# Patient Record
Sex: Female | Born: 1955
Health system: Southern US, Community
[De-identification: ages and names within clinical notes are randomized; demographics above are authoritative.]

## PROBLEM LIST (undated history)

## (undated) DIAGNOSIS — N200 Calculus of kidney: Secondary | ICD-10-CM

## (undated) DIAGNOSIS — E785 Hyperlipidemia, unspecified: Secondary | ICD-10-CM

## (undated) DIAGNOSIS — K589 Irritable bowel syndrome without diarrhea: Secondary | ICD-10-CM

## (undated) DIAGNOSIS — H269 Unspecified cataract: Secondary | ICD-10-CM

## (undated) DIAGNOSIS — IMO0001 Reserved for inherently not codable concepts without codable children: Secondary | ICD-10-CM

## (undated) DIAGNOSIS — M1990 Unspecified osteoarthritis, unspecified site: Secondary | ICD-10-CM

## (undated) DIAGNOSIS — M810 Age-related osteoporosis without current pathological fracture: Secondary | ICD-10-CM

## (undated) DIAGNOSIS — K219 Gastro-esophageal reflux disease without esophagitis: Secondary | ICD-10-CM

## (undated) DIAGNOSIS — I10 Essential (primary) hypertension: Secondary | ICD-10-CM

## (undated) DIAGNOSIS — H409 Unspecified glaucoma: Secondary | ICD-10-CM

## (undated) DIAGNOSIS — E663 Overweight: Secondary | ICD-10-CM

## (undated) DIAGNOSIS — M8580 Other specified disorders of bone density and structure, unspecified site: Secondary | ICD-10-CM

## (undated) HISTORY — DX: Reserved for inherently not codable concepts without codable children: IMO0001

## (undated) HISTORY — DX: Age-related osteoporosis without current pathological fracture: M81.0

## (undated) HISTORY — DX: Unspecified glaucoma: H40.9

## (undated) HISTORY — DX: Unspecified cataract: H26.9

## (undated) HISTORY — DX: Gastro-esophageal reflux disease without esophagitis: K21.9

## (undated) HISTORY — DX: Unspecified osteoarthritis, unspecified site: M19.90

## (undated) HISTORY — DX: Overweight: E66.3

## (undated) HISTORY — DX: Hyperlipidemia, unspecified: E78.5

---

## 2014-04-02 LAB — HM PAP SMEAR

## 2014-05-14 ENCOUNTER — Ambulatory Visit: Payer: Self-pay | Admitting: Family Medicine

## 2014-05-14 LAB — HM MAMMOGRAPHY

## 2014-12-04 ENCOUNTER — Encounter: Payer: Self-pay | Admitting: *Deleted

## 2014-12-05 ENCOUNTER — Encounter: Payer: Self-pay | Admitting: *Deleted

## 2014-12-05 ENCOUNTER — Ambulatory Visit
Admission: RE | Admit: 2014-12-05 | Discharge: 2014-12-05 | Disposition: A | Discharge status: Home / Self Care | Payer: Commercial Managed Care - HMO | Source: Ambulatory Visit | Attending: Gastroenterology | Admitting: Gastroenterology

## 2014-12-05 ENCOUNTER — Ambulatory Visit: Payer: Commercial Managed Care - HMO | Admitting: Anesthesiology

## 2014-12-05 ENCOUNTER — Encounter
Admission: RE | Disposition: A | Discharge status: Home / Self Care | Payer: Self-pay | Source: Ambulatory Visit | Attending: Gastroenterology

## 2014-12-05 DIAGNOSIS — M8580 Other specified disorders of bone density and structure, unspecified site: Secondary | ICD-10-CM | POA: Insufficient documentation

## 2014-12-05 DIAGNOSIS — D122 Benign neoplasm of ascending colon: Secondary | ICD-10-CM | POA: Diagnosis not present

## 2014-12-05 DIAGNOSIS — K640 First degree hemorrhoids: Secondary | ICD-10-CM | POA: Diagnosis not present

## 2014-12-05 DIAGNOSIS — Z86010 Personal history of colonic polyps: Secondary | ICD-10-CM | POA: Insufficient documentation

## 2014-12-05 DIAGNOSIS — Z79899 Other long term (current) drug therapy: Secondary | ICD-10-CM | POA: Diagnosis not present

## 2014-12-05 DIAGNOSIS — Z87891 Personal history of nicotine dependence: Secondary | ICD-10-CM | POA: Insufficient documentation

## 2014-12-05 DIAGNOSIS — K5730 Diverticulosis of large intestine without perforation or abscess without bleeding: Secondary | ICD-10-CM | POA: Insufficient documentation

## 2014-12-05 DIAGNOSIS — Z7982 Long term (current) use of aspirin: Secondary | ICD-10-CM | POA: Diagnosis not present

## 2014-12-05 DIAGNOSIS — Z800 Family history of malignant neoplasm of digestive organs: Secondary | ICD-10-CM | POA: Insufficient documentation

## 2014-12-05 DIAGNOSIS — K219 Gastro-esophageal reflux disease without esophagitis: Secondary | ICD-10-CM | POA: Insufficient documentation

## 2014-12-05 HISTORY — DX: Other specified disorders of bone density and structure, unspecified site: M85.80

## 2014-12-05 HISTORY — DX: Gastro-esophageal reflux disease without esophagitis: K21.9

## 2014-12-05 HISTORY — PX: COLONOSCOPY WITH PROPOFOL: SHX5780

## 2014-12-05 LAB — HM COLONOSCOPY

## 2014-12-05 SURGERY — COLONOSCOPY WITH PROPOFOL
Anesthesia: General

## 2014-12-05 MED ORDER — MIDAZOLAM HCL 2 MG/2ML IJ SOLN
INTRAMUSCULAR | Status: DC | PRN
  Administered 2014-12-05: 1 mg via INTRAVENOUS

## 2014-12-05 MED ORDER — FENTANYL CITRATE (PF) 100 MCG/2ML IJ SOLN
INTRAMUSCULAR | Status: DC | PRN
  Administered 2014-12-05: 50 ug via INTRAVENOUS

## 2014-12-05 MED ORDER — PROPOFOL INFUSION 10 MG/ML OPTIME
INTRAVENOUS | Status: DC | PRN
  Administered 2014-12-05: 140 ug/kg/min via INTRAVENOUS

## 2014-12-05 MED ORDER — SODIUM CHLORIDE 0.9 % IV SOLN
INTRAVENOUS | Status: DC
  Administered 2014-12-05: 08:00:00 via INTRAVENOUS

## 2014-12-05 NOTE — Op Note (Addendum)
[facility] Gastroenterology Patient Name: Alexandria Hill Procedure Date: 12/05/2014 7:44 AM MRN: [id number] Account #: [id number] Date of Birth: 1955/09/28 Admit Type: Outpatient Age: 59 Room: [facility] ENDO ROOM 4 Gender: Female Note Status: Supervisor Override Procedure:         Colonoscopy Indications:       Family history of anal canal cancer in a first-degree                     relative Providers:         Lucilla Lame, MD Referring MD:      Guadalupe Maple, MD (Referring MD) Medicines:         Propofol per Anesthesia Complications:     No immediate complications. Procedure:         Pre-Anesthesia Assessment:                    - Prior to the procedure, a History and Physical was                     performed, and patient medications and allergies were                     reviewed. The patient's tolerance of previous anesthesia                     was also reviewed. The risks and benefits of the procedure                     and the sedation options and risks were discussed with the                     patient. All questions were answered, and informed consent                     was obtained. Prior Anticoagulants: The patient has taken                     no previous anticoagulant or antiplatelet agents. ASA                     Grade Assessment: II - A patient with mild systemic                     disease. After reviewing the risks and benefits, the                     patient was deemed in satisfactory condition to undergo                     the procedure.                    After obtaining informed consent, the colonoscope was                     passed under direct vision. Throughout the procedure, the                     patient's blood pressure, pulse, and oxygen saturations                     were monitored continuously. The Colonoscope was  introduced through the anus and advanced to the the cecum,                     identified by  appendiceal orifice and ileocecal valve. The                     colonoscopy was performed without difficulty. The patient                     tolerated the procedure well. The quality of the bowel                     preparation was excellent. Findings:      The perianal and digital rectal examinations were normal.      A 4 mm polyp was found in the ascending colon. The polyp was sessile.       The polyp was removed with a cold biopsy forceps. Resection and       retrieval were complete.      A few small-mouthed diverticula were found in the sigmoid colon.      Non-bleeding internal hemorrhoids were found during retroflexion. The       hemorrhoids were Grade I (internal hemorrhoids that do not prolapse). Impression:        - One 4 mm polyp in the ascending colon. Resected and                     retrieved.                    - Diverticulosis in the sigmoid colon.                    - Non-bleeding internal hemorrhoids. Recommendation:    - Await pathology results.                    - Repeat colonoscopy in 5 years for surveillance. Procedure Code(s): --- Professional ---                    [phone], Colonoscopy, flexible; with biopsy, single or                     multiple Diagnosis Code(s): --- Professional ---                    Z80.0, Family history of malignant neoplasm of digestive                     organs                    K64.0, First degree hemorrhoids                    D12.2, Benign neoplasm of ascending colon CPT copyright 2014 American [employer] Association. All rights reserved. The codes documented in this report are preliminary and upon coder review may  be revised to meet current compliance requirements. Lucilla Lame, MD 12/05/2014 8:11:50 AM This report has been signed electronically. Number of Addenda: 0 Note Initiated On: 12/05/2014 7:44 AM Scope Withdrawal Time: 0 hours 6 minutes 30 seconds  Total Procedure Duration: 0 hours 11 minutes 58 seconds       [facility]

## 2014-12-05 NOTE — Anesthesia Postprocedure Evaluation (Signed)
  Anesthesia Post-op Note  Patient: Alexandria Hill  Procedure(s) Performed: Procedure(s): COLONOSCOPY WITH PROPOFOL (N/A)  Anesthesia type:General  Patient location: PACU  Post pain: Pain level controlled  Post assessment: Post-op Vital signs reviewed, Patient's Cardiovascular Status Stable, Respiratory Function Stable, Patent Airway and No signs of Nausea or vomiting  Post vital signs: Reviewed and stable  Last Vitals:  Filed Vitals:   12/05/14 0816  BP: 104/59  Pulse: 73  Temp: 35.9 C  Resp: 16    Level of consciousness: awake, alert  and patient cooperative  Complications: No apparent anesthesia complications

## 2014-12-05 NOTE — Anesthesia Preprocedure Evaluation (Signed)
Anesthesia Evaluation  Patient identified by MRN, date of birth, ID band Patient awake    Reviewed: Allergy & Precautions, NPO status   Airway Mallampati: II       Dental no notable dental hx.    Pulmonary former smoker,    Pulmonary exam normal       Cardiovascular negative cardio ROS Normal cardiovascular exam    Neuro/Psych negative psych ROS   GI/Hepatic Neg liver ROS, GERD-  ,  Endo/Other  negative endocrine ROS  Renal/GU negative Renal ROS  negative genitourinary   Musculoskeletal negative musculoskeletal ROS (+)   Abdominal Normal abdominal exam  (+)   Peds negative pediatric ROS (+)  Hematology negative hematology ROS (+)   Anesthesia Other Findings   Reproductive/Obstetrics negative OB ROS                             Anesthesia Physical Anesthesia Plan  ASA: II  Anesthesia Plan: General   Post-op Pain Management:    Induction: Intravenous  Airway Management Planned: Nasal Cannula  Additional Equipment:   Intra-op Plan:   Post-operative Plan:   Informed Consent: I have reviewed the patients History and Physical, chart, labs and discussed the procedure including the risks, benefits and alternatives for the proposed anesthesia with the patient or authorized representative who has indicated his/her understanding and acceptance.     Plan Discussed with: CRNA  Anesthesia Plan Comments:         Anesthesia Quick Evaluation

## 2014-12-05 NOTE — H&P (Signed)
  [facility]  [address] [place], [place] [address] Phone: [phone] Fax : [phone]  Primary Care Physician:  Enid Derry, MD Primary Gastroenterologist:  Dr. Allen Norris  Pre-Procedure History & Physical: HPI:  Alexandria Hill is a 59 y.o. female is here for an colonoscopy.   Past Medical History  Diagnosis Date  . GERD (gastroesophageal reflux disease)   . Osteopenia     Past Surgical History  Procedure Laterality Date  . Cesarean section      Prior to Admission medications   Medication Sig Start Date End Date Taking? Authorizing Provider  aspirin 81 MG tablet Take 81 mg by mouth daily.   Yes Historical Provider, MD  Glucosamine-Chondroitin (OSTEO BI-FLEX REGULAR STRENGTH PO) Take by mouth.   Yes Historical Provider, MD  Multiple Vitamin (MULTIVITAMIN) capsule Take 1 capsule by mouth daily.   Yes Historical Provider, MD  ranitidine (ZANTAC) 75 MG tablet Take 75 mg by mouth 2 (two) times daily.   Yes Historical Provider, MD  ibandronate (BONIVA) 150 MG tablet Take 150 mg by mouth every 30 (thirty) days. Take in the morning with a full glass of water, on an empty stomach, and do not take anything else by mouth or lie down for the next 30 min.    Historical Provider, MD    Allergies as of 10/30/2014  . (Not on File)    History reviewed. No pertinent family history.  History   Social History  . Marital Status: Married    Spouse Name: N/A  . Number of Children: N/A  . Years of Education: N/A   Occupational History  . Not on file.   Social History Main Topics  . Smoking status: Former Research scientist (life sciences)  . Smokeless tobacco: Not on file  . Alcohol Use: No  . Drug Use: No  . Sexual Activity: Not on file   Other Topics Concern  . Not on file   Social History Narrative    Review of Systems: See HPI, otherwise negative ROS  Physical Exam: BP 127/73 mmHg  Pulse 73  Temp(Src) 97.6 F (36.4 C) (Tympanic)  Resp 18  Ht 5\' 3"  (1.6 m)  Wt 145 lb  (65.772 kg)  BMI 25.69 kg/m2  SpO2 100% General:   Alert,  pleasant and cooperative in NAD Head:  Normocephalic and atraumatic. Neck:  Supple; no masses or thyromegaly. Lungs:  Clear throughout to auscultation.    Heart:  Regular rate and rhythm. Abdomen:  Soft, nontender and nondistended. Normal bowel sounds, without guarding, and without rebound.   Neurologic:  Alert and  oriented x4;  grossly normal neurologically.  Impression/Plan: Alexandria Hill is here for an colonoscopy to be performed for personal history of polyps  Risks, benefits, limitations, and alternatives regarding  colonoscopy have been reviewed with the patient.  Questions have been answered.  All parties agreeable.   Ollen Bowl, MD  12/05/2014, 7:30 AM

## 2014-12-05 NOTE — Anesthesia Procedure Notes (Signed)
Performed by: Vaughan Sine Pre-anesthesia Checklist: Patient identified, Emergency Drugs available, Suction available, Patient being monitored and Timeout performed Patient Re-evaluated:Patient Re-evaluated prior to inductionOxygen Delivery Method: Nasal cannula Preoxygenation: Pre-oxygenation with 100% oxygen Intubation Type: IV induction Placement Confirmation: positive ETCO2

## 2014-12-05 NOTE — Transfer of Care (Signed)
Immediate Anesthesia Transfer of Care Note  Patient: Alexandria Hill  Procedure(s) Performed: Procedure(s): COLONOSCOPY WITH PROPOFOL (N/A)  Patient Location: PACU  Anesthesia Type:General  Level of Consciousness: awake and sedated  Airway & Oxygen Therapy: Patient Spontanous Breathing and Patient connected to nasal cannula oxygen  Post-op Assessment: Report given to RN and Post -op Vital signs reviewed and stable  Post vital signs: Reviewed and stable  Last Vitals:  Filed Vitals:   12/05/14 0712  BP: 127/73  Pulse: 73  Temp: 36.4 C  Resp: 18    Complications: No apparent anesthesia complications

## 2014-12-06 ENCOUNTER — Encounter: Payer: Self-pay | Admitting: Gastroenterology

## 2014-12-06 LAB — SURGICAL PATHOLOGY

## 2014-12-10 ENCOUNTER — Encounter: Payer: Self-pay | Admitting: Gastroenterology

## 2015-01-21 ENCOUNTER — Telehealth: Payer: Self-pay

## 2015-01-21 NOTE — Telephone Encounter (Signed)
-----   Message from Evette Cristal sent at 01/21/2015  9:44 AM EDT ----- Please call Patient Alexandria Hill is asking about some insurances tuff you have spoke about before [phone]

## 2015-01-21 NOTE — Telephone Encounter (Signed)
Pt received a bill from [facility] from a colonoscopy in June 2016. Pt would like to speak with you regarding the polyp removed. I tried to explain to her that when you remove a polyp her procedure is coded as a diagnostic. Pt would still like to speak with you about this. Please call.

## 2015-03-25 DIAGNOSIS — K219 Gastro-esophageal reflux disease without esophagitis: Secondary | ICD-10-CM

## 2015-03-25 DIAGNOSIS — IMO0001 Reserved for inherently not codable concepts without codable children: Secondary | ICD-10-CM | POA: Insufficient documentation

## 2015-03-25 DIAGNOSIS — M8580 Other specified disorders of bone density and structure, unspecified site: Secondary | ICD-10-CM | POA: Insufficient documentation

## 2015-04-09 ENCOUNTER — Encounter: Payer: 59 | Admitting: Family Medicine

## 2015-04-24 ENCOUNTER — Encounter: Payer: Self-pay | Admitting: Family Medicine

## 2015-04-24 ENCOUNTER — Ambulatory Visit (INDEPENDENT_AMBULATORY_CARE_PROVIDER_SITE_OTHER): Payer: 59 | Admitting: Family Medicine

## 2015-04-24 VITALS — BP 129/82 | HR 62 | Temp 97.4°F | Ht 63.0 in | Wt 146.0 lb

## 2015-04-24 DIAGNOSIS — Z20820 Contact with and (suspected) exposure to varicella: Secondary | ICD-10-CM | POA: Diagnosis not present

## 2015-04-24 DIAGNOSIS — Z1239 Encounter for other screening for malignant neoplasm of breast: Secondary | ICD-10-CM | POA: Diagnosis not present

## 2015-04-24 DIAGNOSIS — Z0000 Encounter for general adult medical examination without abnormal findings: Secondary | ICD-10-CM

## 2015-04-24 DIAGNOSIS — M8580 Other specified disorders of bone density and structure, unspecified site: Secondary | ICD-10-CM

## 2015-04-24 NOTE — Assessment & Plan Note (Addendum)
Order DEXA scan; try to get 3 servings of calcium a day; fall precautions; 1000 iu vitamin D daily

## 2015-04-24 NOTE — Progress Notes (Signed)
Patient ID: Alexandria Hill, female   DOB: 07/14/1955, 59 y.o.   MRN: [id number]  Subjective:   Alexandria Hill is a 59 y.o. female here for a complete physical exam  Interim issues since last visit: no excitement; flu shot UTD Interested in shingles vaccine, but not sure if she ever had chicken pox  USPSTF grade A and B recommendations Alcohol: less than suggested limit Depression:  Depression screen PHQ 2/9 04/24/2015  Decreased Interest 0  Down, Depressed, Hopeless 0  PHQ - 2 Score 0  Hypertension: controlled Obesity: no Tobacco use: former smoker; quit age 26, 30 years ago HIV, hep B, hep C: done already STD testing and prevention (chl/gon/syphilis): declined Lipids: will get today, fasting Glucose: fasting today Colorectal cancer: June 2016; next due...? Patient not sure, will check Breast cancer: no lumps; mammo UTD BRCA gene screening: no fam hx Intimate partner violence: no Cervical cancer screening: UTD Lung cancer: n/a Osteoporosis: had osteopenia, last DEXA 2014 Fall prevention/vitamin D: discussed AAA: n/a Aspirin: taking Diet: decent diet Exercise: not regular exercise Skin cancer: see derm, goes back in Jan  Past Medical History  Diagnosis Date  . GERD (gastroesophageal reflux disease)   . Osteopenia   . Reflux   . Mitral valve regurgitation     mild   Past Surgical History  Procedure Laterality Date  . Colonoscopy with propofol N/A 12/05/2014    Procedure: COLONOSCOPY WITH PROPOFOL;  Surgeon: Darren Wohl, MD;  Location: [facility] ENDOSCOPY;  Service: Endoscopy;  Laterality: N/A;  . Cesarean section  1987   Family History  Problem Relation Age of Onset  . Aneurysm Mother     brain  . Hypertension Mother   . Stroke Mother     possibly mini strokes  . Heart disease Father   . Heart attack Father   . Cancer Brother     brain  . Diabetes Maternal Aunt   . COPD Neg Hx    Social History  Substance Use Topics  . Smoking status: Former Smoker -- 0.50  packs/day for 10 years    Types: Cigarettes    Quit date: 04/23/1985  . Smokeless tobacco: Never Used  . Alcohol Use: No   Review of Systems  Constitutional: Negative for fever and unexpected weight change.  Respiratory: Negative for shortness of breath.   Cardiovascular: Negative for chest pain.  Gastrointestinal: Negative for blood in stool.  Endocrine: Negative for polydipsia and polyuria.  Genitourinary: Negative for hematuria.  Neurological: Negative for tremors.  Hematological: Does not bruise/bleed easily.  Psychiatric/Behavioral: Negative for dysphoric mood.    Objective:   Filed Vitals:   04/24/15 0803  BP: 129/82  Pulse: 62  Temp: 97.4 F (36.3 C)  Height: 5' 3" (1.6 m)  Weight: 146 lb (66.225 kg)  SpO2: 100%   Body mass index is 25.87 kg/(m^2). Wt Readings from Last 3 Encounters:  04/24/15 146 lb (66.225 kg)  04/02/14 144 lb (65.318 kg)  12/05/14 145 lb (65.772 kg)   Physical Exam  Constitutional: She appears well-developed and well-nourished.  HENT:  Head: Normocephalic and atraumatic.  Right Ear: Hearing, tympanic membrane, external ear and ear canal normal.  Left Ear: Hearing, tympanic membrane, external ear and ear canal normal.  Eyes: Conjunctivae and EOM are normal. Right eye exhibits no hordeolum. Left eye exhibits no hordeolum. No scleral icterus.  Neck: Carotid bruit is not present. No thyromegaly present.  Cardiovascular: Normal rate, regular rhythm, S1 normal, S2 normal and normal heart sounds.   No   extrasystoles are present.  Pulmonary/Chest: Effort normal and breath sounds normal. No respiratory distress. Right breast exhibits no inverted nipple, no mass, no nipple discharge, no skin change and no tenderness. Left breast exhibits no inverted nipple, no mass, no nipple discharge, no skin change and no tenderness. Breasts are symmetrical.  Abdominal: Soft. Normal appearance and bowel sounds are normal. She exhibits no distension, no abdominal bruit,  no pulsatile midline mass and no mass. There is no hepatosplenomegaly. There is no tenderness. No hernia.  Musculoskeletal: Normal range of motion. She exhibits no edema.  Lymphadenopathy:       Head (right side): No submandibular adenopathy present.       Head (left side): No submandibular adenopathy present.    She has no cervical adenopathy.    She has no axillary adenopathy.  Neurological: She is alert. She displays no tremor. No cranial nerve deficit. She exhibits normal muscle tone. Gait normal.  Reflex Scores:      Patellar reflexes are 2+ on the right side and 2+ on the left side. Skin: Skin is warm and dry. No bruising and no ecchymosis noted. No cyanosis. No pallor.  Psychiatric: Her speech is normal and behavior is normal. Thought content normal. Her mood appears not anxious. She does not exhibit a depressed mood.   Assessment/Plan:   Problem List Items Addressed This Visit      Musculoskeletal and Integument   Osteopenia    Order DEXA scan; try to get 3 servings of calcium a day; fall precautions; 1000 iu vitamin D daily      Relevant Orders   DG Bone Density     Other   Breast cancer screening    Yearly mammo; SBE taught and encouraged      Relevant Orders   MM DIGITAL SCREENING BILATERAL   Preventative health care - Primary    USPSTF grade A and B recommendations reviewed with patient; age-appropriate recommendations, preventive care, screening tests, etc discussed and encouraged; healthy living encouraged; see AVS for patient education given to patient      Relevant Orders   CBC with Differential/Platelet (Completed)   Lipid Panel w/o Chol/HDL Ratio (Completed)   Comprehensive metabolic panel (Completed)   Varicella exposure    Check IgG; patient interested in getting shingles vaccine      Relevant Orders   Varicella zoster antibody, IgG (Completed)      Follow up plan: Return in about 1 year (around 04/23/2016) for complete physical.  An after-visit  summary was printed and given to the patient at check-out.  Please see the patient instructions which may contain other information and recommendations beyond what is mentioned above in the assessment and plan.  Orders Placed This Encounter  Procedures  . MM DIGITAL SCREENING BILATERAL  . DG Bone Density  . CBC with Differential/Platelet  . Lipid Panel w/o Chol/HDL Ratio  . Comprehensive metabolic panel  . Varicella zoster antibody, IgG    

## 2015-04-24 NOTE — Patient Instructions (Addendum)
Please do get 3 servings of calcium a day and 1000 iu of vitamin D3 daily Please do call to schedule your mammogram; the number to schedule one at either [facility] or [facility] is [phone] You can have your bone density test done at the same time  Health Maintenance, Female Adopting a healthy lifestyle and getting preventive care can go a long way to promote health and wellness. Talk with your health care provider about what schedule of regular examinations is right for you. This is a good chance for you to check in with your provider about disease prevention and staying healthy. In between checkups, there are plenty of things you can do on your own. Experts have done a lot of research about which lifestyle changes and preventive measures are most likely to keep you healthy. Ask your health care provider for more information. WEIGHT AND DIET  Eat a healthy diet  Be sure to include plenty of vegetables, fruits, low-fat dairy products, and lean protein.  Do not eat a lot of foods high in solid fats, added sugars, or salt.  Get regular exercise. This is one of the most important things you can do for your health.  Most adults should exercise for at least 150 minutes each week. The exercise should increase your heart rate and make you sweat (moderate-intensity exercise).  Most adults should also do strengthening exercises at least twice a week. This is in addition to the moderate-intensity exercise.  Maintain a healthy weight  Body mass index (BMI) is a measurement that can be used to identify possible weight problems. It estimates body fat based on height and weight. Your health care provider can help determine your BMI and help you achieve or maintain a healthy weight.  For females 32 years of age and older:   A BMI below 18.5 is considered underweight.  A BMI of 18.5 to 24.9 is normal.  A BMI of 25 to 29.9 is considered overweight.  A BMI of 30 and  above is considered obese.  Watch levels of cholesterol and blood lipids  You should start having your blood tested for lipids and cholesterol at 61 years of age, then have this test every 5 years.  You may need to have your cholesterol levels checked more often if:  Your lipid or cholesterol levels are high.  You are older than 61 years of age.  You are at high risk for heart disease.  CANCER SCREENING   Lung Cancer  Lung cancer screening is recommended for adults 40-9 years old who are at high risk for lung cancer because of a history of smoking.  A yearly low-dose CT scan of the lungs is recommended for people who:  Currently smoke.  Have quit within the past 15 years.  Have at least a 30-pack-year history of smoking. A pack year is smoking an average of one pack of cigarettes a day for 1 year.  Yearly screening should continue until it has been 15 years since you quit.  Yearly screening should stop if you develop a health problem that would prevent you from having lung cancer treatment.  Breast Cancer  Practice breast self-awareness. This means understanding how your breasts normally appear and feel.  It also means doing regular breast self-exams. Let your health care provider know about any changes, no matter how small.  If you are in your 20s or 30s, you should have a clinical breast exam (CBE) by a health care provider  every 1-3 years as part of a regular health exam.  If you are 64 or older, have a CBE every year. Also consider having a breast X-ray (mammogram) every year.  If you have a family history of breast cancer, talk to your health care provider about genetic screening.  If you are at high risk for breast cancer, talk to your health care provider about having an MRI and a mammogram every year.  Breast cancer gene (BRCA) assessment is recommended for women who have family members with BRCA-related cancers. BRCA-related cancers  include:  Breast.  Ovarian.  Tubal.  Peritoneal cancers.  Results of the assessment will determine the need for genetic counseling and BRCA1 and BRCA2 testing. Cervical Cancer Your health care provider may recommend that you be screened regularly for cancer of the pelvic organs (ovaries, uterus, and vagina). This screening involves a pelvic examination, including checking for microscopic changes to the surface of your cervix (Pap test). You may be encouraged to have this screening done every 3 years, beginning at age 77.  For women ages 58-65, health care providers may recommend pelvic exams and Pap testing every 3 years, or they may recommend the Pap and pelvic exam, combined with testing for human papilloma virus (HPV), every 5 years. Some types of HPV increase your risk of cervical cancer. Testing for HPV may also be done on women of any age with unclear Pap test results.  Other health care providers may not recommend any screening for nonpregnant women who are considered low risk for pelvic cancer and who do not have symptoms. Ask your health care provider if a screening pelvic exam is right for you.  If you have had past treatment for cervical cancer or a condition that could lead to cancer, you need Pap tests and screening for cancer for at least 20 years after your treatment. If Pap tests have been discontinued, your risk factors (such as having a new sexual partner) need to be reassessed to determine if screening should resume. Some women have medical problems that increase the chance of getting cervical cancer. In these cases, your health care provider may recommend more frequent screening and Pap tests. Colorectal Cancer  This type of cancer can be detected and often prevented.  Routine colorectal cancer screening usually begins at 61 years of age and continues through 61 years of age.  Your health care provider may recommend screening at an earlier age if you have risk factors for  colon cancer.  Your health care provider may also recommend using home test kits to check for hidden blood in the stool.  A small camera at the end of a tube can be used to examine your colon directly (sigmoidoscopy or colonoscopy). This is done to check for the earliest forms of colorectal cancer.  Routine screening usually begins at age 86.  Direct examination of the colon should be repeated every 5-10 years through 61 years of age. However, you may need to be screened more often if early forms of precancerous polyps or small growths are found. Skin Cancer  Check your skin from head to toe regularly.  Tell your health care provider about any new moles or changes in moles, especially if there is a change in a mole's shape or color.  Also tell your health care provider if you have a mole that is larger than the size of a pencil eraser.  Always use sunscreen. Apply sunscreen liberally and repeatedly throughout the day.  Protect yourself by  wearing long sleeves, pants, a wide-brimmed hat, and sunglasses whenever you are outside. HEART DISEASE, DIABETES, AND HIGH BLOOD PRESSURE   High blood pressure causes heart disease and increases the risk of stroke. High blood pressure is more likely to develop in:  People who have blood pressure in the high end of the normal range (130-139/85-89 mm Hg).  People who are overweight or obese.  People who are African American.  If you are 31-28 years of age, have your blood pressure checked every 3-5 years. If you are 51 years of age or older, have your blood pressure checked every year. You should have your blood pressure measured twice--once when you are at a hospital or clinic, and once when you are not at a hospital or clinic. Record the average of the two measurements. To check your blood pressure when you are not at a hospital or clinic, you can use:  An automated blood pressure machine at a pharmacy.  A home blood pressure monitor.  If you  are between 76 years and 23 years old, ask your health care provider if you should take aspirin to prevent strokes.  Have regular diabetes screenings. This involves taking a blood sample to check your fasting blood sugar level.  If you are at a normal weight and have a low risk for diabetes, have this test once every three years after 61 years of age.  If you are overweight and have a high risk for diabetes, consider being tested at a younger age or more often. PREVENTING INFECTION  Hepatitis B  If you have a higher risk for hepatitis B, you should be screened for this virus. You are considered at high risk for hepatitis B if:  You were born in a country where hepatitis B is common. Ask your health care provider which countries are considered high risk.  Your parents were born in a high-risk country, and you have not been immunized against hepatitis B (hepatitis B vaccine).  You have HIV or AIDS.  You use needles to inject street drugs.  You live with someone who has hepatitis B.  You have had sex with someone who has hepatitis B.  You get hemodialysis treatment.  You take certain medicines for conditions, including cancer, organ transplantation, and autoimmune conditions. Hepatitis C  Blood testing is recommended for:  Everyone born from [birth year] through 1967.  Anyone with known risk factors for hepatitis C. Sexually transmitted infections (STIs)  You should be screened for sexually transmitted infections (STIs) including gonorrhea and chlamydia if:  You are sexually active and are younger than 61 years of age.  You are older than 61 years of age and your health care provider tells you that you are at risk for this type of infection.  Your sexual activity has changed since you were last screened and you are at an increased risk for chlamydia or gonorrhea. Ask your health care provider if you are at risk.  If you do not have HIV, but are at risk, it may be recommended that you  take a prescription medicine daily to prevent HIV infection. This is called pre-exposure prophylaxis (PrEP). You are considered at risk if:  You are sexually active and do not regularly use condoms or know the HIV status of your partner(s).  You take drugs by injection.  You are sexually active with a partner who has HIV. Talk with your health care provider about whether you are at high risk of being infected with HIV. If  you choose to begin PrEP, you should first be tested for HIV. You should then be tested every 3 months for as long as you are taking PrEP.  PREGNANCY   If you are premenopausal and you may become pregnant, ask your health care provider about preconception counseling.  If you may become pregnant, take 400 to 800 micrograms (mcg) of folic acid every day.  If you want to prevent pregnancy, talk to your health care provider about birth control (contraception). OSTEOPOROSIS AND MENOPAUSE   Osteoporosis is a disease in which the bones lose minerals and strength with aging. This can result in serious bone fractures. Your risk for osteoporosis can be identified using a bone density scan.  If you are 40 years of age or older, or if you are at risk for osteoporosis and fractures, ask your health care provider if you should be screened.  Ask your health care provider whether you should take a calcium or vitamin D supplement to lower your risk for osteoporosis.  Menopause may have certain physical symptoms and risks.  Hormone replacement therapy may reduce some of these symptoms and risks. Talk to your health care provider about whether hormone replacement therapy is right for you.  HOME CARE INSTRUCTIONS   Schedule regular health, dental, and eye exams.  Stay current with your immunizations.   Do not use any tobacco products including cigarettes, chewing tobacco, or electronic cigarettes.  If you are pregnant, do not drink alcohol.  If you are breastfeeding, limit how  much and how often you drink alcohol.  Limit alcohol intake to no more than 1 drink per day for nonpregnant women. One drink equals 12 ounces of beer, 5 ounces of wine, or 1 ounces of hard liquor.  Do not use street drugs.  Do not share needles.  Ask your health care provider for help if you need support or information about quitting drugs.  Tell your health care provider if you often feel depressed.  Tell your health care provider if you have ever been abused or do not feel safe at home.   This information is not intended to replace advice given to you by your health care provider. Make sure you discuss any questions you have with your health care provider.   Document Released: 12/07/2010 Document Revised: 06/14/2014 Document Reviewed: 04/25/2013 Elsevier Interactive Patient Education [employer].

## 2015-04-25 ENCOUNTER — Encounter: Payer: Self-pay | Admitting: Family Medicine

## 2015-04-25 LAB — COMPREHENSIVE METABOLIC PANEL
ALT: 21 IU/L (ref 0–32)
AST: 20 IU/L (ref 0–40)
Albumin/Globulin Ratio: 1.8 (ref 1.1–2.5)
Albumin: 4.3 g/dL (ref 3.6–4.8)
Alkaline Phosphatase: 67 IU/L (ref 39–117)
BILIRUBIN TOTAL: 0.5 mg/dL (ref 0.0–1.2)
BUN/Creatinine Ratio: 19 (ref 11–26)
BUN: 16 mg/dL (ref 8–27)
CALCIUM: 9.3 mg/dL (ref 8.7–10.3)
CHLORIDE: 102 mmol/L (ref 97–106)
CO2: 24 mmol/L (ref 18–29)
Creatinine, Ser: 0.83 mg/dL (ref 0.57–1.00)
GFR calc Af Amer: 88 mL/min/{1.73_m2} (ref >59)
GFR calc non Af Amer: 76 mL/min/{1.73_m2} (ref >59)
Globulin, Total: 2.4 g/dL (ref 1.5–4.5)
Glucose: 86 mg/dL (ref 65–99)
POTASSIUM: 4.9 mmol/L (ref 3.5–5.2)
Sodium: 141 mmol/L (ref 136–144)
Total Protein: 6.7 g/dL (ref 6.0–8.5)

## 2015-04-25 LAB — CBC WITH DIFFERENTIAL/PLATELET
BASOS ABS: 0 10*3/uL (ref 0.0–0.2)
BASOS: 0 %
EOS (ABSOLUTE): 0.1 10*3/uL (ref 0.0–0.4)
Eos: 2 %
Hematocrit: 41.6 % (ref 34.0–46.6)
Hemoglobin: 14.1 g/dL (ref 11.1–15.9)
Immature Grans (Abs): 0 10*3/uL (ref 0.0–0.1)
Immature Granulocytes: 0 %
Lymphocytes Absolute: 1.4 10*3/uL (ref 0.7–3.1)
Lymphs: 30 %
MCH: 30.5 pg (ref 26.6–33.0)
MCHC: 33.9 g/dL (ref 31.5–35.7)
MCV: 90 fL (ref 79–97)
Monocytes Absolute: 0.3 10*3/uL (ref 0.1–0.9)
Monocytes: 7 %
Neutrophils Absolute: 2.8 10*3/uL (ref 1.4–7.0)
Neutrophils: 61 %
Platelets: 335 10*3/uL (ref 150–379)
RBC: 4.63 x10E6/uL (ref 3.77–5.28)
RDW: 13.2 % (ref 12.3–15.4)
WBC: 4.6 10*3/uL (ref 3.4–10.8)

## 2015-04-25 LAB — LIPID PANEL W/O CHOL/HDL RATIO
CHOLESTEROL TOTAL: 169 mg/dL (ref 100–199)
HDL: 53 mg/dL (ref >39)
LDL Calculated: 97 mg/dL (ref 0–99)
TRIGLYCERIDES: 93 mg/dL (ref 0–149)
VLDL CHOLESTEROL CAL: 19 mg/dL (ref 5–40)

## 2015-04-25 LAB — VARICELLA ZOSTER ANTIBODY, IGG: VARICELLA: 2348 {index} (ref >165)

## 2015-04-29 NOTE — Assessment & Plan Note (Signed)
Yearly mammo; SBE taught and encouraged

## 2015-04-29 NOTE — Assessment & Plan Note (Signed)
Check IgG; patient interested in getting shingles vaccine

## 2015-04-29 NOTE — Assessment & Plan Note (Signed)
USPSTF grade A and B recommendations reviewed with patient; age-appropriate recommendations, preventive care, screening tests, etc discussed and encouraged; healthy living encouraged; see AVS for patient education given to patient  

## 2015-05-19 ENCOUNTER — Ambulatory Visit: Payer: 59

## 2015-05-19 ENCOUNTER — Ambulatory Visit
Admission: RE | Admit: 2015-05-19 | Discharge: 2015-05-19 | Disposition: A | Discharge status: Home / Self Care | Payer: 59 | Source: Ambulatory Visit | Attending: Family Medicine | Admitting: Family Medicine

## 2015-05-19 ENCOUNTER — Other Ambulatory Visit: Payer: 59

## 2015-05-19 DIAGNOSIS — M8580 Other specified disorders of bone density and structure, unspecified site: Secondary | ICD-10-CM | POA: Diagnosis present

## 2015-05-19 DIAGNOSIS — Z1239 Encounter for other screening for malignant neoplasm of breast: Secondary | ICD-10-CM

## 2015-05-19 DIAGNOSIS — Z1231 Encounter for screening mammogram for malignant neoplasm of breast: Secondary | ICD-10-CM | POA: Diagnosis not present

## 2015-06-07 ENCOUNTER — Encounter: Payer: Self-pay | Admitting: Family Medicine

## 2015-07-21 ENCOUNTER — Encounter: Payer: Self-pay | Admitting: Family Medicine

## 2015-07-21 ENCOUNTER — Ambulatory Visit (INDEPENDENT_AMBULATORY_CARE_PROVIDER_SITE_OTHER): Payer: 59 | Admitting: Family Medicine

## 2015-07-21 VITALS — BP 131/85 | HR 103 | Temp 98.3°F | Ht 62.5 in | Wt 143.0 lb

## 2015-07-21 DIAGNOSIS — R1084 Generalized abdominal pain: Secondary | ICD-10-CM

## 2015-07-21 DIAGNOSIS — R197 Diarrhea, unspecified: Secondary | ICD-10-CM

## 2015-07-21 NOTE — Progress Notes (Signed)
BP 131/85 mmHg  Pulse 103  Temp(Src) 98.3 F (36.8 C)  Ht 5' 2.5" (1.588 m)  Wt 143 lb (64.864 kg)  BMI 25.72 kg/m2  SpO2 98%   Subjective:    Patient ID: Alexandria Hill, female    DOB: Aug 13, 1955, 60 y.o.   MRN: [id number]  HPI: Alexandria Hill is a 60 y.o. female  Chief Complaint  Patient presents with  . Diarrhea    Patient complains of diarrhea and nausea since Saturday morning.    ABDOMINAL ISSUES- had some chicken salad on Friday, no one else had it Duration: Started Saturday Nature: cramping Location: diffuse  Severity: mild  Radiation: no Frequency: intermittent Alleviating factors: nothing Aggravating factors: nothing Treatments attempted: none Constipation: no Diarrhea: yes Episodes of diarrhea/day: several Mucous in the stool: no Heartburn: no Bloating:yes Flatulence: no Nausea: yes Vomiting: no Melena or hematochezia: no Rash: no Jaundice: no Fever: no Weight loss: no  Relevant past medical, surgical, family and social history reviewed and updated as indicated. Interim medical history since our last visit reviewed. Allergies and medications reviewed and updated.  Review of Systems  Constitutional: Negative.   Respiratory: Negative.   Cardiovascular: Negative.   Gastrointestinal: Positive for nausea, diarrhea and abdominal distention. Negative for vomiting, abdominal pain, constipation, blood in stool, anal bleeding and rectal pain.  Genitourinary: Negative.   Psychiatric/Behavioral: Negative.     Per HPI unless specifically indicated above     Objective:    BP 131/85 mmHg  Pulse 103  Temp(Src) 98.3 F (36.8 C)  Ht 5' 2.5" (1.588 m)  Wt 143 lb (64.864 kg)  BMI 25.72 kg/m2  SpO2 98%  Wt Readings from Last 3 Encounters:  07/21/15 143 lb (64.864 kg)  04/24/15 146 lb (66.225 kg)  04/02/14 144 lb (65.318 kg)    Physical Exam  Constitutional: She is oriented to person, place, and time. She appears well-developed and well-nourished. No  distress.  HENT:  Head: Normocephalic and atraumatic.  Right Ear: Hearing normal.  Left Ear: Hearing normal.  Nose: Nose normal.  Eyes: Conjunctivae and lids are normal. Right eye exhibits no discharge. Left eye exhibits no discharge. No scleral icterus.  Cardiovascular: Normal rate, regular rhythm, normal heart sounds and intact distal pulses.  Exam reveals no gallop and no friction rub.   No murmur heard. Pulmonary/Chest: Effort normal and breath sounds normal. No respiratory distress. She has no wheezes. She has no rales. She exhibits no tenderness.  Abdominal: Soft. She exhibits no distension and no mass. Bowel sounds are increased. There is tenderness in the right lower quadrant and left lower quadrant. There is CVA tenderness. There is no rebound and no guarding.  Musculoskeletal: Normal range of motion.  Neurological: She is alert and oriented to person, place, and time.  Skin: Skin is warm, dry and intact. No rash noted. No erythema. No pallor.  Psychiatric: She has a normal mood and affect. Her speech is normal and behavior is normal. Judgment and thought content normal. Cognition and memory are normal.  Nursing note and vitals reviewed.   Results for orders placed or performed in visit on 04/24/15  CBC with Differential/Platelet  Result Value Ref Range   WBC 4.6 3.4 - 10.8 x10E3/uL   RBC 4.63 3.77 - 5.28 x10E6/uL   Hemoglobin 14.1 11.1 - 15.9 g/dL   Hematocrit 41.6 34.0 - 46.6 %   MCV 90 79 - 97 fL   MCH 30.5 26.6 - 33.0 pg   MCHC 33.9 31.5 - 35.7 g/dL  RDW 13.2 12.3 - 15.4 %   Platelets 335 150 - 379 x10E3/uL   Neutrophils 61 %   Lymphs 30 %   Monocytes 7 %   Eos 2 %   Basos 0 %   Neutrophils Absolute 2.8 1.4 - 7.0 x10E3/uL   Lymphocytes Absolute 1.4 0.7 - 3.1 x10E3/uL   Monocytes Absolute 0.3 0.1 - 0.9 x10E3/uL   EOS (ABSOLUTE) 0.1 0.0 - 0.4 x10E3/uL   Basophils Absolute 0.0 0.0 - 0.2 x10E3/uL   Immature Granulocytes 0 %   Immature Grans (Abs) 0.0 0.0 - 0.1  x10E3/uL  Lipid Panel w/o Chol/HDL Ratio  Result Value Ref Range   Cholesterol, Total 169 100 - 199 mg/dL   Triglycerides 93 0 - 149 mg/dL   HDL 53 >39 mg/dL   VLDL Cholesterol Cal 19 5 - 40 mg/dL   LDL Calculated 97 0 - 99 mg/dL  Comprehensive metabolic panel  Result Value Ref Range   Glucose 86 65 - 99 mg/dL   BUN 16 8 - 27 mg/dL   Creatinine, Ser 0.83 0.57 - 1.00 mg/dL   GFR calc non Af Amer 76 >59 mL/min/1.73   GFR calc Af Amer 88 >59 mL/min/1.73   BUN/Creatinine Ratio 19 11 - 26   Sodium 141 136 - 144 mmol/L   Potassium 4.9 3.5 - 5.2 mmol/L   Chloride 102 97 - 106 mmol/L   CO2 24 18 - 29 mmol/L   Calcium 9.3 8.7 - 10.3 mg/dL   Total Protein 6.7 6.0 - 8.5 g/dL   Albumin 4.3 3.6 - 4.8 g/dL   Globulin, Total 2.4 1.5 - 4.5 g/dL   Albumin/Globulin Ratio 1.8 1.1 - 2.5   Bilirubin Total 0.5 0.0 - 1.2 mg/dL   Alkaline Phosphatase 67 39 - 117 IU/L   AST 20 0 - 40 IU/L   ALT 21 0 - 32 IU/L  Varicella zoster antibody, IgG  Result Value Ref Range   Varicella zoster IgG 2348 Immune >165 index      Assessment & Plan:   Problem List Items Addressed This Visit    None    Visit Diagnoses    Diarrhea, unspecified type    -  Primary    Likley GI bug. CBC normal. Checking CMP for hydration status. Stool studies to be checked. UA positive, but no symptoms. Await Cx. Gatorade. BRAT diet.     Relevant Orders    Comprehensive metabolic panel    CBC With Differential/Platelet    Stool Culture    Stool C-Diff Toxin Assay    Ova and parasite examination    Fecal leukocytes    Generalized abdominal pain        Mild, likely due to intestinal spasm. Continue to monitor.     Relevant Orders    UA/M w/rflx Culture, Routine        Follow up plan: Return if symptoms worsen or fail to improve.

## 2015-07-21 NOTE — Patient Instructions (Signed)

## 2015-07-22 ENCOUNTER — Telehealth: Payer: Self-pay | Admitting: Family Medicine

## 2015-07-22 ENCOUNTER — Other Ambulatory Visit: Payer: 59

## 2015-07-22 DIAGNOSIS — R197 Diarrhea, unspecified: Secondary | ICD-10-CM

## 2015-07-22 LAB — UA/M W/RFLX CULTURE, ROUTINE
BILIRUBIN UA: NEGATIVE
GLUCOSE, UA: NEGATIVE
Ketones, UA: NEGATIVE
NITRITE UA: NEGATIVE
PROTEIN UA: NEGATIVE
SPEC GRAV UA: 1.01 (ref 1.005–1.030)
UUROB: 0.2 mg/dL (ref 0.2–1.0)
pH, UA: 6 (ref 5.0–7.5)

## 2015-07-22 LAB — CBC WITH DIFFERENTIAL/PLATELET
HEMATOCRIT: 44.6 % (ref 34.0–46.6)
Hemoglobin: 16 g/dL — ABNORMAL HIGH (ref 11.1–15.9)
LYMPHS ABS: 1.3 10*3/uL (ref 0.7–3.1)
LYMPHS: 20 %
MCH: 32.2 pg (ref 26.6–33.0)
MCHC: 35.9 g/dL — AB (ref 31.5–35.7)
MCV: 90 fL (ref 79–97)
MID (ABSOLUTE): 0.6 10*3/uL (ref 0.1–1.6)
MID: 9 %
NEUTROS PCT: 71 %
Neutrophils Absolute: 4.5 10*3/uL (ref 1.4–7.0)
PLATELETS: 323 10*3/uL (ref 150–379)
RBC: 4.97 x10E6/uL (ref 3.77–5.28)
RDW: 13.9 % (ref 12.3–15.4)
WBC: 6.4 10*3/uL (ref 3.4–10.8)

## 2015-07-22 LAB — MICROSCOPIC EXAMINATION

## 2015-07-22 LAB — URINE CULTURE, REFLEX

## 2015-07-22 NOTE — Telephone Encounter (Signed)
erroneous

## 2015-07-24 ENCOUNTER — Encounter: Payer: Self-pay | Admitting: Family Medicine

## 2015-07-24 LAB — CLOSTRIDIUM DIFFICILE EIA: C difficile Toxins A+B, EIA: NEGATIVE

## 2015-07-25 ENCOUNTER — Encounter: Payer: Self-pay | Admitting: Family Medicine

## 2015-07-26 LAB — OVA AND PARASITE EXAMINATION

## 2015-07-26 LAB — STOOL CULTURE: E coli, Shiga toxin Assay: NEGATIVE

## 2015-07-26 LAB — FECAL LEUKOCYTES

## 2015-07-28 LAB — COMPREHENSIVE METABOLIC PANEL
A/G RATIO: 1.5 (ref 1.1–2.5)
ALT: 20 IU/L (ref 0–32)
AST: 19 IU/L (ref 0–40)
Albumin: 4.5 g/dL (ref 3.6–4.8)
Alkaline Phosphatase: 78 IU/L (ref 39–117)
BUN/Creatinine Ratio: 18 (ref 11–26)
BUN: 15 mg/dL (ref 8–27)
Bilirubin Total: 0.4 mg/dL (ref 0.0–1.2)
CHLORIDE: 102 mmol/L (ref 96–106)
CO2: 19 mmol/L (ref 18–29)
Calcium: 9.5 mg/dL (ref 8.7–10.3)
Creatinine, Ser: 0.84 mg/dL (ref 0.57–1.00)
GFR calc Af Amer: 87 mL/min/{1.73_m2} (ref >59)
GFR calc non Af Amer: 75 mL/min/{1.73_m2} (ref >59)
Globulin, Total: 3 g/dL (ref 1.5–4.5)
Glucose: 104 mg/dL — ABNORMAL HIGH (ref 65–99)
Potassium: 4.1 mmol/L (ref 3.5–5.2)
Sodium: 137 mmol/L (ref 134–144)
Total Protein: 7.5 g/dL (ref 6.0–8.5)

## 2016-04-09 ENCOUNTER — Other Ambulatory Visit: Payer: Self-pay | Admitting: Family Medicine

## 2016-04-26 ENCOUNTER — Ambulatory Visit (INDEPENDENT_AMBULATORY_CARE_PROVIDER_SITE_OTHER): Payer: 59 | Admitting: Family Medicine

## 2016-04-26 ENCOUNTER — Encounter: Payer: Self-pay | Admitting: Family Medicine

## 2016-04-26 DIAGNOSIS — Z1231 Encounter for screening mammogram for malignant neoplasm of breast: Secondary | ICD-10-CM | POA: Diagnosis not present

## 2016-04-26 DIAGNOSIS — Z0000 Encounter for general adult medical examination without abnormal findings: Secondary | ICD-10-CM

## 2016-04-26 DIAGNOSIS — M8588 Other specified disorders of bone density and structure, other site: Secondary | ICD-10-CM | POA: Diagnosis not present

## 2016-04-26 DIAGNOSIS — Z20820 Contact with and (suspected) exposure to varicella: Secondary | ICD-10-CM

## 2016-04-26 DIAGNOSIS — Z1239 Encounter for other screening for malignant neoplasm of breast: Secondary | ICD-10-CM

## 2016-04-26 LAB — CBC WITH DIFFERENTIAL/PLATELET
BASOS ABS: 0 {cells}/uL (ref 0–200)
Basophils Relative: 0 %
EOS ABS: 45 {cells}/uL (ref 15–500)
Eosinophils Relative: 1 %
HEMATOCRIT: 45 % (ref 35.0–45.0)
HEMOGLOBIN: 14.8 g/dL (ref 11.7–15.5)
Lymphocytes Relative: 24 %
Lymphs Abs: 1080 cells/uL (ref 850–3900)
MCH: 30.3 pg (ref 27.0–33.0)
MCHC: 32.9 g/dL (ref 32.0–36.0)
MCV: 92.2 fL (ref 80.0–100.0)
MONO ABS: 270 {cells}/uL (ref 200–950)
MONOS PCT: 6 %
MPV: 8.6 fL (ref 7.5–12.5)
NEUTROS ABS: 3105 {cells}/uL (ref 1500–7800)
Neutrophils Relative %: 69 %
PLATELETS: 297 10*3/uL (ref 140–400)
RBC: 4.88 MIL/uL (ref 3.80–5.10)
RDW: 13.3 % (ref 11.0–15.0)
WBC: 4.5 10*3/uL (ref 3.8–10.8)

## 2016-04-26 LAB — COMPLETE METABOLIC PANEL WITH GFR
ALBUMIN: 4.3 g/dL (ref 3.6–5.1)
ALK PHOS: 64 U/L (ref 33–130)
ALT: 14 U/L (ref 6–29)
AST: 17 U/L (ref 10–35)
BILIRUBIN TOTAL: 0.6 mg/dL (ref 0.2–1.2)
BUN: 13 mg/dL (ref 7–25)
CALCIUM: 9.2 mg/dL (ref 8.6–10.4)
CO2: 27 mmol/L (ref 20–31)
CREATININE: 0.8 mg/dL (ref 0.50–0.99)
Chloride: 104 mmol/L (ref 98–110)
GFR, EST NON AFRICAN AMERICAN: 79 mL/min (ref >=60)
Glucose, Bld: 86 mg/dL (ref 65–99)
Potassium: 4.3 mmol/L (ref 3.5–5.3)
Sodium: 140 mmol/L (ref 135–146)
TOTAL PROTEIN: 7.3 g/dL (ref 6.1–8.1)

## 2016-04-26 LAB — LIPID PANEL
CHOL/HDL RATIO: 2.7 ratio (ref <5.0)
CHOLESTEROL: 171 mg/dL (ref <200)
HDL: 63 mg/dL (ref >50)
LDL Cholesterol: 91 mg/dL (ref <100)
TRIGLYCERIDES: 84 mg/dL (ref <150)
VLDL: 17 mg/dL (ref <30)

## 2016-04-26 NOTE — Assessment & Plan Note (Signed)
Next DEXA due Dec 2018

## 2016-04-26 NOTE — Progress Notes (Signed)
BP 124/82   Pulse 83   Temp 97.9 F (36.6 C) (Oral)   Resp 14   Ht 5' 3.3" (1.608 m)   Wt 149 lb (67.6 kg)   SpO2 98%   BMI 26.14 kg/m    Subjective:    Patient ID: Alexandria Hill, female    DOB: [birth year], 60 y.o.   MRN: [id number]  HPI: Alexandria Hill is a 60 y.o. female  Chief Complaint  Patient presents with  . Annual Exam   USPSTF grade A and B recommendations Alcohol: occasional Depression:  Depression screen [facility] 2/9 04/26/2016 04/24/2015  Decreased Interest 0 0  Down, Depressed, Hopeless 0 0  PHQ - 2 Score 0 0   Hypertension: controlled Obesity: no Tobacco use: quit remotely HIV, hep B, hep C: done STD testing and prevention (chl/gon/syphilis): not interested Lipids: today, fasting Glucose: today, fasting Colorectal cancer: due 2021 Breast cancer: UTD, Dec 2016 BRCA gene screening: no fam hx Intimate partner violence:no Cervical cancer screening: UTD; yeast infection on one Lung cancer: n/ Osteoporosis: no steroids; normal DEXA Fall prevention/vitamin D: taking vit D in her multivitamin, taking calcium AAA: n/a Aspirin: aspirin Diet: better than typical American Exercise: no, will try to do better, did [employer], not convenient, will try to walk; start slow Skin cancer: has had several skin cancers removed; faae and shoulder; see [place] dermatologist   Depression screen [facility] 2/9 04/26/2016 04/24/2015  Decreased Interest 0 0  Down, Depressed, Hopeless 0 0  PHQ - 2 Score 0 0    No flowsheet data found.  Relevant past medical, surgical, family and social history reviewed Past Medical History:  Diagnosis Date  . GERD (gastroesophageal reflux disease)   . Mitral valve regurgitation    mild  . Osteopenia   . Reflux    Past Surgical History:  Procedure Laterality Date  . CESAREAN SECTION  1987  . COLONOSCOPY WITH PROPOFOL N/A 12/05/2014   Procedure: COLONOSCOPY WITH PROPOFOL;  Surgeon: Lucilla Lame, MD;  Location: [facility] ENDOSCOPY;   Service: Endoscopy;  Laterality: N/A;   Family History  Problem Relation Age of Onset  . Aneurysm Mother     brain  . Hypertension Mother   . Stroke Mother     possibly mini strokes  . Heart disease Father   . Heart attack Father   . Cancer Brother     brain  . Diabetes Maternal Aunt   . COPD Neg Hx   . Breast cancer Neg Hx    Social History  Substance Use Topics  . Smoking status: Former Smoker    Packs/day: 0.50    Years: 10.00    Types: Cigarettes    Quit date: 04/23/1985  . Smokeless tobacco: Never Used  . Alcohol use No    Interim medical history since last visit reviewed. Allergies and medications reviewed  Review of Systems Per HPI unless specifically indicated above     Objective:    BP 124/82   Pulse 83   Temp 97.9 F (36.6 C) (Oral)   Resp 14   Ht 5' 3.3" (1.608 m)   Wt 149 lb (67.6 kg)   SpO2 98%   BMI 26.14 kg/m   Wt Readings from Last 3 Encounters:  04/26/16 149 lb (67.6 kg)  07/21/15 143 lb (64.9 kg)  04/24/15 146 lb (66.2 kg)    Physical Exam  Constitutional: She appears well-developed and well-nourished.  HENT:  Head: Normocephalic and atraumatic.  Right Ear: Hearing, tympanic membrane, external ear  and ear canal normal.  Left Ear: Hearing, tympanic membrane, external ear and ear canal normal.  Eyes: Conjunctivae and EOM are normal. Right eye exhibits no hordeolum. Left eye exhibits no hordeolum. No scleral icterus.  Neck: No JVD present. Carotid bruit is not present. No thyromegaly present.  Cardiovascular: Normal rate, regular rhythm, S1 normal, S2 normal and normal heart sounds.   No extrasystoles are present.  Pulmonary/Chest: Effort normal and breath sounds normal. No respiratory distress. Right breast exhibits no inverted nipple, no mass, no nipple discharge, no skin change and no tenderness. Left breast exhibits no inverted nipple, no mass, no nipple discharge, no skin change and no tenderness. Breasts are symmetrical.  Abdominal:  Soft. Normal appearance and bowel sounds are normal. She exhibits no distension, no abdominal bruit, no pulsatile midline mass and no mass. There is no hepatosplenomegaly. There is no tenderness. No hernia.  Musculoskeletal: Normal range of motion. She exhibits no edema.  Lymphadenopathy:       Head (right side): No submandibular adenopathy present.       Head (left side): No submandibular adenopathy present.    She has no cervical adenopathy.    She has no axillary adenopathy.  Neurological: She is alert. She displays no tremor. No cranial nerve deficit. She exhibits normal muscle tone. Gait normal.  Reflex Scores:      Patellar reflexes are 2+ on the right side and 2+ on the left side. Skin: Skin is warm and dry. No bruising and no ecchymosis noted. No cyanosis. No pallor.  Psychiatric: Her speech is normal and behavior is normal. Thought content normal. Her mood appears not anxious. She does not exhibit a depressed mood.    Results for orders placed or performed in visit on 07/22/15  Stool Culture  Result Value Ref Range   Salmonella/Shigella Screen Final report    Stool Culture result 1 (RSASHR) Comment    Campylobacter Culture Final report    Stool Culture result 1 (CMPCXR) Comment    E coli, Shiga toxin Assay Negative Negative  Stool C-Diff Toxin Assay  Result Value Ref Range   C difficile Toxins A+B, EIA Negative Negative  Ova and parasite examination  Result Value Ref Range   OVA + PARASITE EXAM Final report    O&P result 1 Comment   Fecal leukocytes  Result Value Ref Range   WHITE BLOOD CELLS (WBC), STOOL Final report None Seen   RESULT 1 Comment       Assessment & Plan:   Problem List Items Addressed This Visit      Musculoskeletal and Integument   Osteopenia    Next DEXA due Dec 2018        Other   Varicella exposure    Discussed getting shingles vaccine later, since it is a live virus      Preventative health care    USPSTF grade A and B recommendations  reviewed with patient; age-appropriate recommendations, preventive care, screening tests, etc discussed and encouraged; healthy living encouraged; see AVS for patient education given to patient      Relevant Orders   Lipid panel   CBC with Differential/Platelet   COMPLETE METABOLIC PANEL WITH GFR   Breast cancer screening    Order mammogram      Relevant Orders   MM DIGITAL SCREENING BILATERAL      Follow up plan: Return in about 1 year (around 04/26/2017) for complete physical.  An after-visit summary was printed and given to the patient at  check-out.  Please see the patient instructions which may contain other information and recommendations beyond what is mentioned above in the assessment and plan.  No orders of the defined types were placed in this encounter.   Orders Placed This Encounter  Procedures  . MM DIGITAL SCREENING BILATERAL  . Lipid panel  . CBC with Differential/Platelet  . COMPLETE METABOLIC PANEL WITH GFR

## 2016-04-26 NOTE — Assessment & Plan Note (Signed)
Order mammogram °

## 2016-04-26 NOTE — Assessment & Plan Note (Signed)
USPSTF grade A and B recommendations reviewed with patient; age-appropriate recommendations, preventive care, screening tests, etc discussed and encouraged; healthy living encouraged; see AVS for patient education given to patient  

## 2016-04-26 NOTE — Assessment & Plan Note (Signed)
Discussed getting shingles vaccine later, since it is a live virus

## 2016-04-26 NOTE — Patient Instructions (Addendum)
Your previous bone density did show osteopenia (Dec 2016), so do get weight-bearing exercise, three servings of calcium a day, and we'll get another bone density scan in Dec 2018 We'll get labs today Please do call to schedule your mammogram; the number to schedule one at either [facility] or [facility] is [phone] If you have not heard anything from my staff in a week about any orders/referrals/studies from today, please contact us here to follow-up (336) [phone]  Health Maintenance, Female Introduction Adopting a healthy lifestyle and getting preventive care can go a long way to promote health and wellness. Talk with your health care provider about what schedule of regular examinations is right for you. This is a good chance for you to check in with your provider about disease prevention and staying healthy. In between checkups, there are plenty of things you can do on your own. Experts have done a lot of research about which lifestyle changes and preventive measures are most likely to keep you healthy. Ask your health care provider for more information. Weight and diet Eat a healthy diet  Be sure to include plenty of vegetables, fruits, low-fat dairy products, and lean protein.  Do not eat a lot of foods high in solid fats, added sugars, or salt.  Get regular exercise. This is one of the most important things you can do for your health.  Most adults should exercise for at least 150 minutes each week. The exercise should increase your heart rate and make you sweat (moderate-intensity exercise).  Most adults should also do strengthening exercises at least twice a week. This is in addition to the moderate-intensity exercise. Maintain a healthy weight  Body mass index (BMI) is a measurement that can be used to identify possible weight problems. It estimates body fat based on height and weight. Your health care provider can help determine your BMI and help you  achieve or maintain a healthy weight.  For females 81 years of age and older:  A BMI below 18.5 is considered underweight.  A BMI of 18.5 to 24.9 is normal.  A BMI of 25 to 29.9 is considered overweight.  A BMI of 30 and above is considered obese. Watch levels of cholesterol and blood lipids  You should start having your blood tested for lipids and cholesterol at 62 years of age, then have this test every 5 years.  You may need to have your cholesterol levels checked more often if:  Your lipid or cholesterol levels are high.  You are older than 63 years of age.  You are at high risk for heart disease. Cancer screening Lung Cancer  Lung cancer screening is recommended for adults 68-84 years old who are at high risk for lung cancer because of a history of smoking.  A yearly low-dose CT scan of the lungs is recommended for people who:  Currently smoke.  Have quit within the past 15 years.  Have at least a 30-pack-year history of smoking. A pack year is smoking an average of one pack of cigarettes a day for 1 year.  Yearly screening should continue until it has been 15 years since you quit.  Yearly screening should stop if you develop a health problem that would prevent you from having lung cancer treatment. Breast Cancer  Practice breast self-awareness. This means understanding how your breasts normally appear and feel.  It also means doing regular breast self-exams. Let your health care provider know about any changes, no matter  how small.  If you are in your 20s or 30s, you should have a clinical breast exam (CBE) by a health care provider every 1-3 years as part of a regular health exam.  If you are 78 or older, have a CBE every year. Also consider having a breast X-ray (mammogram) every year.  If you have a family history of breast cancer, talk to your health care provider about genetic screening.  If you are at high risk for breast cancer, talk to your health care  provider about having an MRI and a mammogram every year.  Breast cancer gene (BRCA) assessment is recommended for women who have family members with BRCA-related cancers. BRCA-related cancers include:  Breast.  Ovarian.  Tubal.  Peritoneal cancers.  Results of the assessment will determine the need for genetic counseling and BRCA1 and BRCA2 testing. Cervical Cancer  Your health care provider may recommend that you be screened regularly for cancer of the pelvic organs (ovaries, uterus, and vagina). This screening involves a pelvic examination, including checking for microscopic changes to the surface of your cervix (Pap test). You may be encouraged to have this screening done every 3 years, beginning at age 69.  For women ages 83-65, health care providers may recommend pelvic exams and Pap testing every 3 years, or they may recommend the Pap and pelvic exam, combined with testing for human papilloma virus (HPV), every 5 years. Some types of HPV increase your risk of cervical cancer. Testing for HPV may also be done on women of any age with unclear Pap test results.  Other health care providers may not recommend any screening for nonpregnant women who are considered low risk for pelvic cancer and who do not have symptoms. Ask your health care provider if a screening pelvic exam is right for you.  If you have had past treatment for cervical cancer or a condition that could lead to cancer, you need Pap tests and screening for cancer for at least 20 years after your treatment. If Pap tests have been discontinued, your risk factors (such as having a new sexual partner) need to be reassessed to determine if screening should resume. Some women have medical problems that increase the chance of getting cervical cancer. In these cases, your health care provider may recommend more frequent screening and Pap tests. Colorectal Cancer  This type of cancer can be detected and often prevented.  Routine  colorectal cancer screening usually begins at 62 years of age and continues through 62 years of age.  Your health care provider may recommend screening at an earlier age if you have risk factors for colon cancer.  Your health care provider may also recommend using home test kits to check for hidden blood in the stool.  A small camera at the end of a tube can be used to examine your colon directly (sigmoidoscopy or colonoscopy). This is done to check for the earliest forms of colorectal cancer.  Routine screening usually begins at age 42.  Direct examination of the colon should be repeated every 5-10 years through 62 years of age. However, you may need to be screened more often if early forms of precancerous polyps or small growths are found. Skin Cancer  Check your skin from head to toe regularly.  Tell your health care provider about any new moles or changes in moles, especially if there is a change in a mole's shape or color.  Also tell your health care provider if you have a mole that  is larger than the size of a pencil eraser.  Always use sunscreen. Apply sunscreen liberally and repeatedly throughout the day.  Protect yourself by wearing long sleeves, pants, a wide-brimmed hat, and sunglasses whenever you are outside. Heart disease, diabetes, and high blood pressure  High blood pressure causes heart disease and increases the risk of stroke. High blood pressure is more likely to develop in:  People who have blood pressure in the high end of the normal range (130-139/85-89 mm Hg).  People who are overweight or obese.  People who are African American.  If you are 3-19 years of age, have your blood pressure checked every 3-5 years. If you are 29 years of age or older, have your blood pressure checked every year. You should have your blood pressure measured twice-once when you are at a hospital or clinic, and once when you are not at a hospital or clinic. Record the average of the two  measurements. To check your blood pressure when you are not at a hospital or clinic, you can use:  An automated blood pressure machine at a pharmacy.  A home blood pressure monitor.  If you are between 36 years and 58 years old, ask your health care provider if you should take aspirin to prevent strokes.  Have regular diabetes screenings. This involves taking a blood sample to check your fasting blood sugar level.  If you are at a normal weight and have a low risk for diabetes, have this test once every three years after 62 years of age.  If you are overweight and have a high risk for diabetes, consider being tested at a younger age or more often. Preventing infection Hepatitis B  If you have a higher risk for hepatitis B, you should be screened for this virus. You are considered at high risk for hepatitis B if:  You were born in a country where hepatitis B is common. Ask your health care provider which countries are considered high risk.  Your parents were born in a high-risk country, and you have not been immunized against hepatitis B (hepatitis B vaccine).  You have HIV or AIDS.  You use needles to inject street drugs.  You live with someone who has hepatitis B.  You have had sex with someone who has hepatitis B.  You get hemodialysis treatment.  You take certain medicines for conditions, including cancer, organ transplantation, and autoimmune conditions. Hepatitis C  Blood testing is recommended for:  Everyone born from [birth year] through 1967.  Anyone with known risk factors for hepatitis C. Sexually transmitted infections (STIs)  You should be screened for sexually transmitted infections (STIs) including gonorrhea and chlamydia if:  You are sexually active and are younger than 62 years of age.  You are older than 62 years of age and your health care provider tells you that you are at risk for this type of infection.  Your sexual activity has changed since you were last  screened and you are at an increased risk for chlamydia or gonorrhea. Ask your health care provider if you are at risk.  If you do not have HIV, but are at risk, it may be recommended that you take a prescription medicine daily to prevent HIV infection. This is called pre-exposure prophylaxis (PrEP). You are considered at risk if:  You are sexually active and do not regularly use condoms or know the HIV status of your partner(s).  You take drugs by injection.  You are sexually active with a  partner who has HIV. Talk with your health care provider about whether you are at high risk of being infected with HIV. If you choose to begin PrEP, you should first be tested for HIV. You should then be tested every 3 months for as long as you are taking PrEP. Pregnancy  If you are premenopausal and you may become pregnant, ask your health care provider about preconception counseling.  If you may become pregnant, take 400 to 800 micrograms (mcg) of folic acid every day.  If you want to prevent pregnancy, talk to your health care provider about birth control (contraception). Osteoporosis and menopause  Osteoporosis is a disease in which the bones lose minerals and strength with aging. This can result in serious bone fractures. Your risk for osteoporosis can be identified using a bone density scan.  If you are 55 years of age or older, or if you are at risk for osteoporosis and fractures, ask your health care provider if you should be screened.  Ask your health care provider whether you should take a calcium or vitamin D supplement to lower your risk for osteoporosis.  Menopause may have certain physical symptoms and risks.  Hormone replacement therapy may reduce some of these symptoms and risks. Talk to your health care provider about whether hormone replacement therapy is right for you. Follow these instructions at home:  Schedule regular health, dental, and eye exams.  Stay current with your  immunizations.  Do not use any tobacco products including cigarettes, chewing tobacco, or electronic cigarettes.  If you are pregnant, do not drink alcohol.  If you are breastfeeding, limit how much and how often you drink alcohol.  Limit alcohol intake to no more than 1 drink per day for nonpregnant women. One drink equals 12 ounces of beer, 5 ounces of wine, or 1 ounces of hard liquor.  Do not use street drugs.  Do not share needles.  Ask your health care provider for help if you need support or information about quitting drugs.  Tell your health care provider if you often feel depressed.  Tell your health care provider if you have ever been abused or do not feel safe at home. This information is not intended to replace advice given to you by your health care provider. Make sure you discuss any questions you have with your health care provider. Document Released: 12/07/2010 Document Revised: 10/30/2015 Document Reviewed: 02/25/2015  2017 [employer]

## 2016-05-19 ENCOUNTER — Ambulatory Visit
Admission: RE | Admit: 2016-05-19 | Discharge: 2016-05-19 | Disposition: A | Discharge status: Home / Self Care | Payer: 59 | Source: Ambulatory Visit | Attending: Family Medicine | Admitting: Family Medicine

## 2016-05-19 DIAGNOSIS — Z1231 Encounter for screening mammogram for malignant neoplasm of breast: Secondary | ICD-10-CM | POA: Insufficient documentation

## 2016-05-19 DIAGNOSIS — Z1239 Encounter for other screening for malignant neoplasm of breast: Secondary | ICD-10-CM

## 2016-07-08 ENCOUNTER — Telehealth: Payer: Self-pay | Admitting: Family Medicine

## 2016-07-08 MED ORDER — OSELTAMIVIR PHOSPHATE 75 MG PO CAPS: 75.0000 mg | ORAL_CAPSULE | Freq: Every day | ORAL | 0 refills | Status: AC

## 2016-07-08 NOTE — Telephone Encounter (Signed)
Fine for tamiflu

## 2016-07-08 NOTE — Telephone Encounter (Signed)
Patient is currently living with her daughter who just came home with a new born. However her daughters 69yr old son was just dx with the flu and patient would like to know if you could give her something as a preventative. Please send to [facility]

## 2016-08-11 ENCOUNTER — Encounter: Payer: Self-pay | Admitting: Family Medicine

## 2016-08-11 ENCOUNTER — Ambulatory Visit (INDEPENDENT_AMBULATORY_CARE_PROVIDER_SITE_OTHER): Payer: 59 | Admitting: Family Medicine

## 2016-08-11 VITALS — BP 160/90 | HR 98 | Temp 98.1°F | Resp 16 | Wt 153.9 lb

## 2016-08-11 DIAGNOSIS — R079 Chest pain, unspecified: Secondary | ICD-10-CM

## 2016-08-11 DIAGNOSIS — R9431 Abnormal electrocardiogram [ECG] [EKG]: Secondary | ICD-10-CM

## 2016-08-11 DIAGNOSIS — R12 Heartburn: Secondary | ICD-10-CM

## 2016-08-11 DIAGNOSIS — I10 Essential (primary) hypertension: Secondary | ICD-10-CM | POA: Diagnosis not present

## 2016-08-11 DIAGNOSIS — E663 Overweight: Secondary | ICD-10-CM

## 2016-08-11 HISTORY — DX: Overweight: E66.3

## 2016-08-11 MED ORDER — METOPROLOL SUCCINATE ER 25 MG PO TB24: 25.0000 mg | ORAL_TABLET | Freq: Every day | ORAL | 1 refills | Status: DC

## 2016-08-11 MED ORDER — RANITIDINE HCL 150 MG PO TABS: 150.0000 mg | ORAL_TABLET | Freq: Two times a day (BID) | ORAL | 3 refills | Status: DC

## 2016-08-11 MED ORDER — METOPROLOL SUCCINATE ER 25 MG PO TB24: 12.5000 mg | ORAL_TABLET | Freq: Every day | ORAL | 1 refills | Status: DC

## 2016-08-11 NOTE — Progress Notes (Signed)
BP (!) 160/90   Pulse 98   Temp 98.1 F (36.7 C) (Oral)   Resp 16   Wt 153 lb 14.4 oz (69.8 kg)   SpO2 98%   BMI 27.00 kg/m    Subjective:    Patient ID: Alexandria Hill, female    DOB: 1956/02/02, 61 y.o.   MRN: [id number]  HPI: Alexandria Hill is a 60 y.o. female  Chief Complaint  Patient presents with  . Hypertension    elevated   Patient has had her BP checked when giving blood; 142/94 at [facility] Patient has been checking her BP; numbers since then: 155/98, 148/87, 153/83 145/82, 163/86, 143/79 Today was 155/90 No major change in diet Job is not stressful Going through foreclosure on her house soon; loan is in ex-husband's name, trying to not stress over that No major health crises in family No pets No migraines; avoid public speaking; does get some fluid build-up in hands She has never been on BP medicine in the past Dull headache a few days this week; had a little chest pain last week; thought it was her acid reflux in the morning after eating; went away after lasting all morning, last Thursday she thinks; burped or belched, but no regurgitation; no hx of stress test, then she thought maybe in her 69s, diagnosing the acid reflux; mid-chest; no SHOB or nausea; no radiation then  Suspected strong fam hx of hypertension Father had open heart attack surgery in his 62s, died of heart attack at 35 Mother had a leaky heart failure Patient has mild mitral regurg  She has had lower back pain, goes down the latera right leg; no loss of B/B function; also having neck pain; back pain since 2009; neck pain going on for about a week  Depression screen [facility] 2/9 08/11/2016 04/26/2016 04/24/2015  Decreased Interest 0 0 0  Down, Depressed, Hopeless 0 0 0  PHQ - 2 Score 0 0 0   Relevant past medical, surgical, family and social history reviewed Past Medical History:  Diagnosis Date  . GERD (gastroesophageal reflux disease)   . Mitral valve regurgitation    mild  . Osteopenia     . Overweight (BMI 25.0-29.9) 08/11/2016  . Reflux    Past Surgical History:  Procedure Laterality Date  . CESAREAN SECTION  1987  . COLONOSCOPY WITH PROPOFOL N/A 12/05/2014   Procedure: COLONOSCOPY WITH PROPOFOL;  Surgeon: Lucilla Lame, MD;  Location: [facility] ENDOSCOPY;  Service: Endoscopy;  Laterality: N/A;   Family History  Problem Relation Age of Onset  . Aneurysm Mother     brain  . Hypertension Mother   . Stroke Mother     possibly mini strokes  . Heart disease Father   . Heart attack Father   . Mental retardation Father   . Cancer Brother   . Diabetes Maternal Aunt   . Hypertension Sister   . Obesity Sister   . Mental illness Sister   . Cancer Maternal Grandmother   . Hypertension Sister   . Mental illness Sister   . Depression Sister   . Hypertension Sister   . Depression Sister   . COPD Neg Hx   . Breast cancer Neg Hx    Social History  Substance Use Topics  . Smoking status: Former Smoker    Packs/day: 0.50    Years: 10.00    Types: Cigarettes    Quit date: 04/23/1985  . Smokeless tobacco: Never Used  . Alcohol use No  Interim medical history since last visit reviewed. Allergies and medications reviewed  Review of Systems Per HPI unless specifically indicated above     Objective:    BP (!) 160/90   Pulse 98   Temp 98.1 F (36.7 C) (Oral)   Resp 16   Wt 153 lb 14.4 oz (69.8 kg)   SpO2 98%   BMI 27.00 kg/m   Wt Readings from Last 3 Encounters:  08/11/16 153 lb 14.4 oz (69.8 kg)  04/26/16 149 lb (67.6 kg)  07/21/15 143 lb (64.9 kg)    Physical Exam  Constitutional: She appears well-developed and well-nourished. No distress.  HENT:  Head: Normocephalic and atraumatic.  Eyes: EOM are normal. No scleral icterus.  Neck: Carotid bruit is not present. No thyromegaly present.  Some tightness of the trapezius bilaterally, superior shoulders; no tenderness over the posterior cervical spine or paraspinous muscles  Cardiovascular: Normal rate, regular  rhythm and normal heart sounds.   No murmur heard. Pulmonary/Chest: Effort normal and breath sounds normal. No respiratory distress. She has no wheezes.  Abdominal: Soft. Bowel sounds are normal. She exhibits no distension and no abdominal bruit.  Musculoskeletal: Normal range of motion. She exhibits no edema.  Neurological: She is alert. She exhibits normal muscle tone.  Skin: Skin is warm and dry. She is not diaphoretic. No pallor.  Psychiatric: She has a normal mood and affect. Her behavior is normal. Judgment and thought content normal. Her mood appears not anxious. She does not exhibit a depressed mood.    Results for orders placed or performed in visit on 04/26/16  Lipid panel  Result Value Ref Range   Cholesterol 171 <200 mg/dL   Triglycerides 84 <150 mg/dL   HDL 63 >50 mg/dL   Total CHOL/HDL Ratio 2.7 <5.0 Ratio   VLDL 17 <30 mg/dL   LDL Cholesterol 91 <100 mg/dL  CBC with Differential/Platelet  Result Value Ref Range   WBC 4.5 3.8 - 10.8 K/uL   RBC 4.88 3.80 - 5.10 MIL/uL   Hemoglobin 14.8 11.7 - 15.5 g/dL   HCT 45.0 35.0 - 45.0 %   MCV 92.2 80.0 - 100.0 fL   MCH 30.3 27.0 - 33.0 pg   MCHC 32.9 32.0 - 36.0 g/dL   RDW 13.3 11.0 - 15.0 %   Platelets 297 140 - 400 K/uL   MPV 8.6 7.5 - 12.5 fL   Neutro Abs 3,105 1,500 - 7,800 cells/uL   Lymphs Abs 1,080 850 - 3,900 cells/uL   Monocytes Absolute 270 200 - 950 cells/uL   Eosinophils Absolute 45 15 - 500 cells/uL   Basophils Absolute 0 0 - 200 cells/uL   Neutrophils Relative % 69 %   Lymphocytes Relative 24 %   Monocytes Relative 6 %   Eosinophils Relative 1 %   Basophils Relative 0 %   Smear Review Criteria for review not met   COMPLETE METABOLIC PANEL WITH GFR  Result Value Ref Range   Sodium 140 135 - 146 mmol/L   Potassium 4.3 3.5 - 5.3 mmol/L   Chloride 104 98 - 110 mmol/L   CO2 27 20 - 31 mmol/L   Glucose, Bld 86 65 - 99 mg/dL   BUN 13 7 - 25 mg/dL   Creat 0.80 0.50 - 0.99 mg/dL   Total Bilirubin 0.6 0.2 -  1.2 mg/dL   Alkaline Phosphatase 64 33 - 130 U/L   AST 17 10 - 35 U/L   ALT 14 6 - 29 U/L  Total Protein 7.3 6.1 - 8.1 g/dL   Albumin 4.3 3.6 - 5.1 g/dL   Calcium 9.2 8.6 - 10.4 mg/dL   GFR, Est African American >89 >=60 mL/min   GFR, Est Non African American 79 >=60 mL/min      Assessment & Plan:   Problem List Items Addressed This Visit      Cardiovascular and Mediastinum   Essential hypertension, benign - Primary    Try DASH guidelines; avoid decongestants; start beta-blocker; return for recheck 7-10 days      Relevant Medications   metoprolol succinate (TOPROL-XL) 25 MG 24 hr tablet   Other Relevant Orders   EKG 12-Lead (Completed)     Other   Overweight (BMI 25.0-29.9)    Suggested losing a few pounds; see AVS      Heartburn    Will have her increase ranitidine to 150 mg BID      Chest pain    EKG today; start beta-blocker; continue aspirin; refer to cardiologist; gave her women and heart disease speech; call 911 if chest pain recurs; hopefully, chest pain from heartburn, doubling up on H2 blocker, but referring for cardiology work-up given family hx      Relevant Orders   EKG 12-Lead (Completed)   Ambulatory referral to Cardiology    Other Visit Diagnoses    Abnormal EKG       NSR but questionable Q waves V1 and V2; refer to cardiologist; gave her women and heart disease speech; aspirin, beta-blocker; call 911 if any chest pain       Follow up plan: Return in about 9 days (around 08/20/2016) for BP and pulse recheck with Roselyn Reef or [employer].  An after-visit summary was printed and given to the patient at [place].  Please see the patient instructions which may contain other information and recommendations beyond what is mentioned above in the assessment and plan.  Meds ordered this encounter  Medications  . DISCONTD: ranitidine (ZANTAC) 150 MG tablet    Sig: Take 150 mg by mouth daily.  Marland Kitchen DISCONTD: metoprolol succinate (TOPROL-XL) 25 MG 24 hr tablet    Sig:  Take 1 tablet (25 mg total) by mouth daily.    Dispense:  30 tablet    Refill:  1  . ranitidine (ZANTAC) 150 MG tablet    Sig: Take 1 tablet (150 mg total) by mouth 2 (two) times daily.    Dispense:  60 tablet    Refill:  3  . metoprolol succinate (TOPROL-XL) 25 MG 24 hr tablet    Sig: Take 0.5 tablets (12.5 mg total) by mouth daily.    Dispense:  15 tablet    Refill:  1    Changing, fill this instead    Orders Placed This Encounter  Procedures  . Ambulatory referral to Cardiology  . EKG 12-Lead

## 2016-08-11 NOTE — Assessment & Plan Note (Signed)
Suggested losing a few pounds; see AVS

## 2016-08-11 NOTE — Patient Instructions (Addendum)
Start the new blood pressure medicine  Your goal blood pressure is less than 150 mmHg on top at least, and under 140 would be even better per newer recommendations Try to follow the DASH guidelines (DASH stands for Dietary Approaches to Stop Hypertension) Try to limit the sodium in your diet.  Ideally, consume less than 1.5 grams (less than 1,500mg ) per day. Do not add salt when cooking or at the table.  Check the sodium amount on labels when shopping, and choose items lower in sodium when given a choice. Avoid or limit foods that already contain a lot of sodium. Eat a diet rich in fruits and vegetables and whole grains.  I'll recommend losing about 10-12 pounds over the coming 3-4 months Check out the information at familydoctor.org entitled "Nutrition for Weight Loss: What You Need to Know about Fad Diets" Try to lose between 1-2 pounds per week by taking in fewer calories and burning off more calories You can succeed by limiting portions, limiting foods dense in calories and fat, becoming more active, and drinking 8 glasses of water a day (64 ounces) Don't skip meals, especially breakfast, as skipping meals may alter your metabolism Do not use over-the-counter weight loss pills or gimmicks that claim rapid weight loss A healthy BMI (or body mass index) is between 18.5 and 24.9 You can calculate your ideal BMI at the [place] website ClubMonetize.fr  I've put in a referral for you to see a cardiologist In the meantime (or ever in the future), if you develop chest pain, call 911 and chew four 81 mg chewable baby aspirin (these should be different from the coated 81 mg aspirin that you take daily; chewable baby aspirin are good to keep on hand for an emergency  [place] stands for "Dietary Approaches to Stop Hypertension." The DASH eating plan is a healthy eating plan that has been shown to reduce high blood pressure (hypertension). It  may also reduce your risk for type 2 diabetes, heart disease, and stroke. The DASH eating plan may also help with weight loss. What are tips for following this plan? General guidelines   Avoid eating more than 2,300 mg (milligrams) of salt (sodium) a day. If you have hypertension, you may need to reduce your sodium intake to 1,500 mg a day.  Limit alcohol intake to no more than 1 drink a day for nonpregnant women and 2 drinks a day for men. One drink equals 12 oz of beer, 5 oz of wine, or 1 oz of hard liquor.  Work with your health care provider to maintain a healthy body weight or to lose weight. Ask what an ideal weight is for you.  Get at least 30 minutes of exercise that causes your heart to beat faster (aerobic exercise) most days of the week. Activities may include walking, swimming, or biking.  Work with your health care provider or diet and nutrition specialist (dietitian) to adjust your eating plan to your individual calorie needs. Reading food labels   Check food labels for the amount of sodium per serving. Choose foods with less than 5 percent of the Daily Value of sodium. Generally, foods with less than 300 mg of sodium per serving fit into this eating plan.  To find whole grains, look for the word "whole" as the first word in the ingredient list. Shopping   Buy products labeled as "low-sodium" or "no salt added."  Buy fresh foods. Avoid canned foods and premade or frozen meals. Cooking  Avoid adding salt when cooking. Use salt-free seasonings or herbs instead of table salt or sea salt. Check with your health care provider or pharmacist before using salt substitutes.  Do not fry foods. Cook foods using healthy methods such as baking, boiling, grilling, and broiling instead.  Cook with heart-healthy oils, such as olive, canola, soybean, or sunflower oil. Meal planning    Eat a balanced diet that includes:  5 or more servings of fruits and vegetables each day. At each  meal, try to fill half of your plate with fruits and vegetables.  Up to 6-8 servings of whole grains each day.  Less than 6 oz of lean meat, poultry, or fish each day. A 3-oz serving of meat is about the same size as a deck of cards. One egg equals 1 oz.  2 servings of low-fat dairy each day.  A serving of nuts, seeds, or beans 5 times each week.  Heart-healthy fats. Healthy fats called Omega-3 fatty acids are found in foods such as flaxseeds and coldwater fish, like sardines, salmon, and mackerel.  Limit how much you eat of the following:  Canned or prepackaged foods.  Food that is high in trans fat, such as fried foods.  Food that is high in saturated fat, such as fatty meat.  Sweets, desserts, sugary drinks, and other foods with added sugar.  Full-fat dairy products.  Do not salt foods before eating.  Try to eat at least 2 vegetarian meals each week.  Eat more home-cooked food and less restaurant, buffet, and fast food.  When eating at a restaurant, ask that your food be prepared with less salt or no salt, if possible. What foods are recommended? The items listed may not be a complete list. Talk with your dietitian about what dietary choices are best for you. Grains  Whole-grain or whole-wheat bread. Whole-grain or whole-wheat pasta. Brown rice. [place]. Bulgur. Whole-grain and low-sodium cereals. Pita bread. Low-fat, low-sodium crackers. Whole-wheat flour tortillas. Vegetables  Fresh or frozen vegetables (raw, steamed, roasted, or grilled). Low-sodium or reduced-sodium tomato and vegetable juice. Low-sodium or reduced-sodium tomato sauce and tomato paste. Low-sodium or reduced-sodium canned vegetables. Fruits  All fresh, dried, or frozen fruit. Canned fruit in natural juice (without added sugar). Meat and other protein foods  Skinless chicken or [place]. Ground chicken or [place]. Pork with fat trimmed off. Fish and seafood. Egg whites. Dried beans, peas, or lentils.  Unsalted nuts, nut butters, and seeds. Unsalted canned beans. Lean cuts of beef with fat trimmed off. Low-sodium, lean deli meat. Dairy  Low-fat (1%) or fat-free (skim) milk. Fat-free, low-fat, or reduced-fat cheeses. Nonfat, low-sodium ricotta or cottage cheese. Low-fat or nonfat yogurt. Low-fat, low-sodium cheese. Fats and oils  Soft margarine without trans fats. Vegetable oil. Low-fat, reduced-fat, or light mayonnaise and salad dressings (reduced-sodium). Canola, safflower, olive, soybean, and sunflower oils. Avocado. Seasoning and other foods  Herbs. Spices. Seasoning mixes without salt. Unsalted popcorn and pretzels. Fat-free sweets. What foods are not recommended? The items listed may not be a complete list. Talk with your dietitian about what dietary choices are best for you. Grains  Baked goods made with fat, such as croissants, muffins, or some breads. Dry pasta or rice meal packs. Vegetables  Creamed or fried vegetables. Vegetables in a cheese sauce. Regular canned vegetables (not low-sodium or reduced-sodium). Regular canned tomato sauce and paste (not low-sodium or reduced-sodium). Regular tomato and vegetable juice (not low-sodium or reduced-sodium). Angie Fava. Olives. Fruits  Canned fruit in a light  or heavy syrup. Fried fruit. Fruit in cream or butter sauce. Meat and other protein foods  Fatty cuts of meat. Ribs. Fried meat. Berniece Salines. Sausage. Bologna and other processed lunch meats. Salami. Fatback. Hotdogs. Bratwurst. Salted nuts and seeds. Canned beans with added salt. Canned or smoked fish. Whole eggs or egg yolks. Chicken or [place] with skin. Dairy  Whole or 2% milk, cream, and half-and-half. Whole or full-fat cream cheese. Whole-fat or sweetened yogurt. Full-fat cheese. Nondairy creamers. Whipped toppings. Processed cheese and cheese spreads. Fats and oils  Butter. Stick margarine. Lard. Shortening. Ghee. Bacon fat. Tropical oils, such as coconut, palm kernel, or palm  oil. Seasoning and other foods  Salted popcorn and pretzels. Onion salt, garlic salt, seasoned salt, table salt, and sea salt. Worcestershire sauce. Tartar sauce. Barbecue sauce. Teriyaki sauce. Soy sauce, including reduced-sodium. Steak sauce. Canned and packaged gravies. Fish sauce. Oyster sauce. Cocktail sauce. Horseradish that you find on the shelf. Ketchup. Mustard. Meat flavorings and tenderizers. Bouillon cubes. Hot sauce and Tabasco sauce. Premade or packaged marinades. Premade or packaged taco seasonings. Relishes. Regular salad dressings. Where to find more information:  National Heart, Lung, and [place]: https://wilson-eaton.com/  American Heart Association: www.heart.org Summary  The DASH eating plan is a healthy eating plan that has been shown to reduce high blood pressure (hypertension). It may also reduce your risk for type 2 diabetes, heart disease, and stroke.  With the DASH eating plan, you should limit salt (sodium) intake to 2,300 mg a day. If you have hypertension, you may need to reduce your sodium intake to 1,500 mg a day.  When on the DASH eating plan, aim to eat more fresh fruits and vegetables, whole grains, lean proteins, low-fat dairy, and heart-healthy fats.  Work with your health care provider or diet and nutrition specialist (dietitian) to adjust your eating plan to your individual calorie needs. This information is not intended to replace advice given to you by your health care provider. Make sure you discuss any questions you have with your health care provider. Document Released: 05/13/2011 Document Revised: 05/17/2016 Document Reviewed: 05/17/2016 Elsevier Interactive Patient Education  2017 Elsevier Inc.  Coronary Artery Disease, Female Coronary artery disease (CAD) is a condition in which the arteries that lead to the heart (coronary arteries) become narrow or blocked. The narrowing or blockage can lead to decreased blood flow to the heart. Prolonged  reduced blood flow can cause a heart attack (myocardial infarction or MI). This condition may also be called coronary heart disease. Because CAD is the leading cause of death in women, it is important to understand what causes this condition and how it is treated. What are the causes? CAD is most often caused by atherosclerosis. This is the buildup of fat and cholesterol (plaque) on the inside of the arteries. Over time, the plaque may narrow or block the artery, reducing blood flow to the heart. Plaque can also become weak and break off within a coronary artery and cause a sudden blockage. Other less common causes of CAD include:  An embolism or blood clot in a coronary artery.  A tearing of the artery (spontaneous coronary artery dissection).  An aneurysm.  Inflammation (vasculitis) in the artery wall. What increases the risk? The following factors may make you more likely to develop this condition:  Age. Women over age 30 are at a greater risk of CAD.  Family history of CAD.  High blood pressure (hypertension).  Diabetes.  High cholesterol levels.  Tobacco use.  Lack of exercise.  Menopause.  All postmenopausal women are at greater risk of CAD.  Women who have experienced menopause between the ages of 64-45 (early menopause) are at a higher risk of CAD.  Women who have experienced menopause before age 5 (premature menopause) are at a very high risk of CAD.  Excessive alcohol use  A diet high in saturated and trans fats, such as fried food and processed meat. Other possible risk factors include:  High stress levels.  Depression  Obesity.  Sleep apnea. What are the signs or symptoms? Many people do not have any symptoms during the early stages of CAD. As the condition progresses, symptoms may include:  Chest pain (angina). The pain can:  Feel like crushing or squeezing, or a tightness, pressure, fullness, or heaviness in the chest.  Last more than a few  minutes or can stop and recur. The pain tends to get worse with exercise or stress and to fade with rest.  Pain in the arms, neck, jaw, or back.  Unexplained heartburn or indigestion.  Shortness of breath.  Nausea.  Sudden cold sweats.  Sudden light-headedness.  Fluttering or fast heartbeat (palpitations). Many women have chest discomfort and the other symptoms. However, women often have unusual (atypical) symptoms, such as:  Fatigue.  Vomiting.  Unexplained feelings of nervousness or anxiety.  Unexplained weakness.  Dizziness or fainting. How is this diagnosed? This condition is diagnosed based on:  Your family and medical history.  A physical exam.  Tests, including:  A test to check the electrical signals in your heart (electrocardiogram).  Exercise stress test. This looks for signs of blockage when the heart is stressed with exercise, such as running on a treadmill.  Pharmacologic stress test. This test looks for signs of blockage when the heart is being stressed with a medicine.  Blood tests.  Coronary angiogram. This is a procedure to look at the coronary arteries to see if there is any blockage. During this test, a dye is injected into your arteries so they appear on an X-ray.  A test that uses sound waves to take a picture of your heart (echocardiogram).  Chest X-ray. How is this treated? This condition may be treated by:  Healthy lifestyle changes to reduce risk factors.  Medicines such as:  Antiplatelet medicines and blood-thinning medicines, such as aspirin. These help prevent blood clots.  Nitroglycerin.  Blood pressure medicines.  Cholesterol-lowering medicine.  Coronary angioplasty and stenting. During this procedure, a thin, flexible tube is inserted through a blood vessel and into a blocked artery. A balloon or similar device on the end of the tube is inflated to open up the artery. In some cases, a small, mesh tube (stent) is inserted  into the artery to keep it open.  Coronary artery bypass surgery. During this surgery, veins or arteries from other parts of the body are used to create a bypass around the blockage and allow blood to reach your heart. Follow these instructions at home: Medicines   Take over-the-counter and prescription medicines only as told by your health care provider.  Do not take the following medicines unless your health care provider approves:  NSAIDs, such as ibuprofen, naproxen, or celecoxib.  Vitamin supplements that contain vitamin A, vitamin E, or both.  Hormone replacement therapy that contains estrogen with or without progestin. Lifestyle   Follow an exercise program approved by your health care provider. Aim for 150 minutes of moderate exercise or 75 minutes of vigorous exercise each week.  Maintain a healthy weight or lose weight as approved by your health care provider.  Rest when you are tired.  Learn to manage stress or try to limit your stress. Ask your health care provider for suggestions if you need help.  Get screened for depression and seek treatment, if needed.  Do not use any products that contain nicotine or tobacco, such as cigarettes and e-cigarettes. If you need help quitting, ask your health care provider.  Do not use illegal drugs. Eating and drinking   Follow a heart-healthy diet. A dietitian can help educate you about healthy food options and changes. In general, eat plenty of fruits and vegetables, lean meats, and whole grains.  Avoid foods high in:  Sugar.  Salt (sodium).  Saturated fats, such as processed or fatty meat.  Trans fats, such as fried food.  Use healthy cooking methods such as roasting, grilling, broiling, baking, poaching, steaming, or stir-frying.  If you drink alcohol, and your health care provider approves, limit your alcohol intake to no more than 1 drink per day. One drink equals 12 ounces of beer, 5 ounces of wine, or 1 ounces of  hard liquor. General instructions   Manage any other health conditions, such as hypertension and diabetes. These conditions affect your heart.  Your health care provider may ask you to monitor your blood pressure. Ideally, your blood pressure should be below 130/80.  Keep all follow-up visits as told by your health care provider. This is important. Get help right away if:  You have pain in your chest, neck, arm, jaw, stomach, or back that:  Lasts more than a few minutes.  Is recurring.  Is not relieved by taking medicine under your tongue (sublingualnitroglycerin).  You have profuse sweating without cause.  You have unexplained:  Heartburn or indigestion.  Shortness of breath or difficulty breathing.  Fluttering or fast heartbeat (palpitations).  Nausea or vomiting.  Fatigue.  Feelings of nervousness or anxiety.  Weakness.  Diarrhea.  You have sudden light-headedness or dizziness.  You faint.  You feel like hurting yourself or think about taking your own life. These symptoms may represent a serious problem that is an emergency. Do not wait to see if the symptoms will go away. Get medical help right away. Call your local emergency services (911 in the [place] Do not drive yourself to the hospital. Summary  Coronary artery disease (CAD) is a process in which the arteries that lead to the heart (coronary arteries) become narrow or blocked. The narrowing or blockage can lead to a heart attack.  Many women have chest discomfort and other common symptoms of CAD. However, women often have different (atypical) symptoms, such as fatigue, vomiting, and dizziness or weakness.  CAD can be treated with lifestyle changes, medicines, surgery, or a combination of these treatments. This information is not intended to replace advice given to you by your health care provider. Make sure you discuss any questions you have with your health care provider. Document Released: 08/16/2011  Document Revised: 05/14/2016 Document Reviewed: 05/14/2016 Elsevier Interactive Patient Education  2017 [employer].

## 2016-08-11 NOTE — Assessment & Plan Note (Signed)
Try DASH guidelines; avoid decongestants; start beta-blocker; return for recheck 7-10 days

## 2016-08-11 NOTE — Assessment & Plan Note (Signed)
Will have her increase ranitidine to 150 mg BID

## 2016-08-11 NOTE — Assessment & Plan Note (Addendum)
EKG today; start beta-blocker; continue aspirin; refer to cardiologist; gave her women and heart disease speech; call 911 if chest pain recurs; hopefully, chest pain from heartburn, doubling up on H2 blocker, but referring for cardiology work-up given family hx

## 2016-08-12 ENCOUNTER — Encounter: Payer: Self-pay | Admitting: Cardiology

## 2016-08-12 ENCOUNTER — Ambulatory Visit (INDEPENDENT_AMBULATORY_CARE_PROVIDER_SITE_OTHER): Payer: 59 | Admitting: Cardiology

## 2016-08-12 VITALS — BP 134/84 | HR 78 | Ht 63.0 in | Wt 154.0 lb

## 2016-08-12 DIAGNOSIS — I10 Essential (primary) hypertension: Secondary | ICD-10-CM | POA: Diagnosis not present

## 2016-08-12 DIAGNOSIS — R9431 Abnormal electrocardiogram [ECG] [EKG]: Secondary | ICD-10-CM

## 2016-08-12 DIAGNOSIS — R079 Chest pain, unspecified: Secondary | ICD-10-CM

## 2016-08-12 NOTE — Progress Notes (Signed)
Cardiology Office Note   Date:  08/12/2016   ID:  Alexandria Hill, DOB 01-09-1956, MRN [id number]  Referring Doctor:  Enid Derry, MD   Cardiologist:   Wende Bushy, MD   Reason for consultation:  Chief Complaint  Patient presents with  . OTHER    NP. Referred by Dr. Sanda Klein for HTN. Pt c/o chest pain. Meds reviewed with pt verbally.      History of Present Illness: Alexandria Hill is a 61 y.o. female who presents for Chest pain. Ongoing for quite some time now, unknown duration, a few months probably. Discomfort in the chest, a few minutes at a time, randomly occurring, nonradiating, mild to moderate in intensity.  Not very physically active. No complaints of shortness of breath and level of exertion.  No palpitations.  Recently concerned about blood pressure numbers being elevated.  ROS:  Please see the history of present illness. Aside from mentioned under HPI, all other systems are reviewed and negative.     Past Medical History:  Diagnosis Date  . GERD (gastroesophageal reflux disease)   . Mitral valve regurgitation    mild  . Osteopenia   . Overweight (BMI 25.0-29.9) 08/11/2016  . Reflux     Past Surgical History:  Procedure Laterality Date  . CESAREAN SECTION  1987  . COLONOSCOPY WITH PROPOFOL N/A 12/05/2014   Procedure: COLONOSCOPY WITH PROPOFOL;  Surgeon: Lucilla Lame, MD;  Location: [facility] ENDOSCOPY;  Service: Endoscopy;  Laterality: N/A;     reports that she quit smoking about 31 years ago. Her smoking use included Cigarettes. She has a 5.00 pack-year smoking history. She has never used smokeless tobacco. She reports that she does not drink alcohol or use drugs.   family history includes Aneurysm in her mother; Cancer in her brother and maternal grandmother; Depression in her sister and sister; Diabetes in her maternal aunt; Heart attack in her father; Heart disease in her father; Hypertension in her mother, sister, sister, and sister; Mental illness in her sister  and sister; Mental retardation in her father; Obesity in her sister; Stroke in her mother.   Outpatient Medications Prior to Visit  Medication Sig Dispense Refill  . aspirin 81 MG tablet Take 81 mg by mouth daily.    Marland Kitchen CALCIUM PO Take by mouth.    . Glucosamine-Chondroitin (OSTEO BI-FLEX REGULAR STRENGTH PO) Take by mouth.    . metoprolol succinate (TOPROL-XL) 25 MG 24 hr tablet Take 0.5 tablets (12.5 mg total) by mouth daily. 15 tablet 1  . Multiple Vitamin (MULTIVITAMIN) capsule Take 1 capsule by mouth daily.    . ranitidine (ZANTAC) 150 MG tablet Take 1 tablet (150 mg total) by mouth 2 (two) times daily. 60 tablet 3   No facility-administered medications prior to visit.      Allergies: Patient has no known allergies.    PHYSICAL EXAM: VS:  BP 134/84 (BP Location: Right Arm, Patient Position: Sitting, Cuff Size: Normal)   Pulse 78   Ht 5\' 3"  (1.6 m)   Wt 154 lb (69.9 kg)   BMI 27.28 kg/m  , Body mass index is 27.28 kg/m. Wt Readings from Last 3 Encounters:  08/12/16 154 lb (69.9 kg)  08/11/16 153 lb 14.4 oz (69.8 kg)  04/26/16 149 lb (67.6 kg)    GENERAL:  well developed, well nourished, not in acute distress HEENT: normocephalic, pink conjunctivae, anicteric sclerae, no xanthelasma, normal dentition, oropharynx clear NECK:  no neck vein engorgement, JVP normal, no hepatojugular reflux, carotid upstroke  brisk and symmetric, no bruit, no thyromegaly, no lymphadenopathy LUNGS:  good respiratory effort, clear to auscultation bilaterally CV:  PMI not displaced, no thrills, no lifts, S1 and S2 within normal limits, no palpable S3 or S4, no murmurs, no rubs, no gallops ABD:  Soft, nontender, nondistended, normoactive bowel sounds, no abdominal aortic bruit, no hepatomegaly, no splenomegaly MS: nontender back, no kyphosis, no scoliosis, no joint deformities EXT:  2+ DP/PT pulses, no edema, no varicosities, no cyanosis, no clubbing SKIN: warm, nondiaphoretic, normal turgor, no  ulcers NEUROPSYCH: alert, oriented to person, place, and time, sensory/motor grossly intact, normal mood, appropriate affect  Recent Labs: 04/26/2016: ALT 14; BUN 13; Creat 0.80; Hemoglobin 14.8; Platelets 297; Potassium 4.3; Sodium 140   Lipid Panel    Component Value Date/Time   CHOL 171 04/26/2016 0903   CHOL 169 04/24/2015 0848   TRIG 84 04/26/2016 0903   HDL 63 04/26/2016 0903   HDL 53 04/24/2015 0848   CHOLHDL 2.7 04/26/2016 0903   VLDL 17 04/26/2016 0903   LDLCALC 91 04/26/2016 0903   LDLCALC 97 04/24/2015 0848     Other studies Reviewed:  EKG:  The ekg from 08/12/2016 was personally reviewed by me and it revealed sinus rhythm, question septal infarct, 78 BPM.  Additional studies/ records that were reviewed personally reviewed by me today include: None available   ASSESSMENT AND PLAN: Chest pain Abnormal EKG Recommend further evaluation with pharmacologic nuclear stress test, echo  Patient unable to walk on treadmill.  HTN Being followed by PCP. Blood pressure good today. Continue blood pressure monitoring.   Current medicines are reviewed at length with the patient today.  The patient does not have concerns regarding medicines.  Labs/ tests ordered today include:  Orders Placed This Encounter  Procedures  . NM Myocar Multi W/Spect W/Wall Motion / EF  . EKG 12-Lead  . ECHOCARDIOGRAM COMPLETE    I had a lengthy and detailed discussion with the patient regarding diagnoses, prognosis, diagnostic options.   Disposition:   FU with undersigned after tests   Thank you for this consultation. We will forwarding this consultation to referring physician.   Signed, Wende Bushy, MD  08/12/2016 6:03 PM    [place]  This note was generated in part with voice recognition software and I apologize for any typographical errors that were not detected and corrected.

## 2016-08-12 NOTE — Patient Instructions (Addendum)
Testing/Procedures: Your physician has requested that you have an echocardiogram. Echocardiography is a painless test that uses sound waves to create images of your heart. It provides your doctor with information about the size and shape of your heart and how well your heart's chambers and valves are working. This procedure takes approximately one hour. There are no restrictions for this procedure.  [place]  Your caregiver has ordered a Stress Test with nuclear imaging. The purpose of this test is to evaluate the blood supply to your heart muscle. This procedure is referred to as a "Non-Invasive Stress Test." This is because other than having an IV started in your vein, nothing is inserted or "invades" your body. Cardiac stress tests are done to find areas of poor blood flow to the heart by determining the extent of coronary artery disease (CAD). Some patients exercise on a treadmill, which naturally increases the blood flow to your heart, while others who are  unable to walk on a treadmill due to physical limitations have a pharmacologic/chemical stress agent called Lexiscan . This medicine will mimic walking on a treadmill by temporarily increasing your coronary blood flow.   Please note: these test may take anywhere between 2-4 hours to complete  PLEASE REPORT TO [facility] AT THE FIRST DESK WILL DIRECT YOU WHERE TO GO  Date of Procedure:_Thursday August 19, 2016 at 08:00AM__  Arrival Time for Procedure:_Arrive at 07:45AM to register_  Instructions regarding medication:   __X__:  Hold Metoprolol the night before procedure and morning of procedure    PLEASE NOTIFY THE OFFICE AT LEAST 24 HOURS IN ADVANCE IF YOU ARE UNABLE TO KEEP YOUR APPOINTMENT.  [phone] AND  PLEASE NOTIFY NUCLEAR MEDICINE AT [facility] AT LEAST 24 HOURS IN ADVANCE IF YOU ARE UNABLE TO KEEP YOUR APPOINTMENT. [phone]  How to prepare for your Myoview test:  1. Do not eat or drink  after midnight 2. No caffeine for 24 hours prior to test 3. No smoking 24 hours prior to test. 4. Your medication may be taken with water.  If your doctor stopped a medication because of this test, do not take that medication. 5. Ladies, please do not wear dresses.  Skirts or pants are appropriate. Please wear a short sleeve shirt. 6. No perfume, cologne or lotion. 7. Wear comfortable walking shoes. No heels!      Follow-Up: Your physician recommends that you schedule a follow-up appointment as needed. We will call you with results and if needed schedule follow up at that time.   It was a pleasure seeing you today here in the office. Please do not hesitate to give [place] a call back if you have any further questions. Barrera, BSN   Echocardiogram An echocardiogram, or echocardiography, uses sound waves (ultrasound) to produce an image of your heart. The echocardiogram is simple, painless, obtained within a short period of time, and offers valuable information to your health care provider. The images from an echocardiogram can provide information such as:  Evidence of coronary artery disease (CAD).  Heart size.  Heart muscle function.  Heart valve function.  Aneurysm detection.  Evidence of a past heart attack.  Fluid buildup around the heart.  Heart muscle thickening.  Assess heart valve function. Tell a health care provider about:  Any allergies you have.  All medicines you are taking, including vitamins, herbs, eye drops, creams, and over-the-counter medicines.  Any problems you or family members have had with  anesthetic medicines.  Any blood disorders you have.  Any surgeries you have had.  Any medical conditions you have.  Whether you are pregnant or may be pregnant. What happens before the procedure? No special preparation is needed. Eat and drink normally. What happens during the procedure?  In order to produce an image of your heart, gel  will be applied to your chest and a wand-like tool (transducer) will be moved over your chest. The gel will help transmit the sound waves from the transducer. The sound waves will harmlessly bounce off your heart to allow the heart images to be captured in real-time motion. These images will then be recorded.  You may need an IV to receive a medicine that improves the quality of the pictures. What happens after the procedure? You may return to your normal schedule including diet, activities, and medicines, unless your health care provider tells you otherwise. This information is not intended to replace advice given to you by your health care provider. Make sure you discuss any questions you have with your health care provider. Document Released: 05/21/2000 Document Revised: 01/10/2016 Document Reviewed: 01/29/2013 Elsevier Interactive Patient Education  2017 [place]. Cardiac Nuclear Scan A cardiac nuclear scan is a test that measures blood flow to the heart when a person is resting and when he or she is exercising. The test looks for problems such as:  Not enough blood reaching a portion of the heart.  The heart muscle not working normally. You may need this test if:  You have heart disease.  You have had abnormal lab results.  You have had heart surgery or angioplasty.  You have chest pain.  You have shortness of breath. In this test, a radioactive dye (tracer) is injected into your bloodstream. After the tracer has traveled to your heart, an imaging device is used to measure how much of the tracer is absorbed by or distributed to various areas of your heart. This procedure is usually done at a hospital and takes 2-4 hours. Tell a health care provider about:  Any allergies you have.  All medicines you are taking, including vitamins, herbs, eye drops, creams, and over-the-counter medicines.  Any problems you or family members have had with the use of anesthetic medicines.  Any  blood disorders you have.  Any surgeries you have had.  Any medical conditions you have.  Whether you are pregnant or may be pregnant. What are the risks? Generally, this is a safe procedure. However, problems may occur, including:  Serious chest pain and heart attack. This is only a risk if the stress portion of the test is done.  Rapid heartbeat.  Sensation of warmth in your chest. This usually passes quickly. What happens before the procedure?  Ask your health care provider about changing or stopping your regular medicines. This is especially important if you are taking diabetes medicines or blood thinners.  Remove your jewelry on the day of the procedure. What happens during the procedure?  An IV tube will be inserted into one of your veins.  Your health care provider will inject a small amount of radioactive tracer through the tube.  You will wait for 20-40 minutes while the tracer travels through your bloodstream.  Your heart activity will be monitored with an electrocardiogram (ECG).  You will lie down on an exam table.  Images of your heart will be taken for about 15-20 minutes.  You may be asked to exercise on a treadmill or stationary bike. While  you exercise, your heart's activity will be monitored with an ECG, and your blood pressure will be checked. If you are unable to exercise, you may be given a medicine to increase blood flow to parts of your heart.  When blood flow to your heart has peaked, a tracer will again be injected through the IV tube.  After 20-40 minutes, you will get back on the exam table and have more images taken of your heart.  When the procedure is over, your IV tube will be removed. The procedure may vary among health care providers and hospitals. Depending on the type of tracer used, scans may need to be repeated 3-4 hours later. What happens after the procedure?  Unless your health care provider tells you otherwise, you may return to  your normal schedule, including diet, activities, and medicines.  Unless your health care provider tells you otherwise, you may increase your fluid intake. This will help flush the contrast dye from your body. Drink enough fluid to keep your urine clear or pale yellow.  It is up to you to get your test results. Ask your health care provider, or the department that is doing the test, when your results will be ready. Summary  A cardiac nuclear scan measures the blood flow to the heart when a person is resting and when he or she is exercising.  You may need this test if you are at risk for heart disease.  Tell your health care provider if you are pregnant.  Unless your health care provider tells you otherwise, increase your fluid intake. This will help flush the contrast dye from your body. Drink enough fluid to keep your urine clear or pale yellow. This information is not intended to replace advice given to you by your health care provider. Make sure you discuss any questions you have with your health care provider. Document Released: 06/18/2004 Document Revised: 05/26/2016 Document Reviewed: 05/02/2013 Elsevier Interactive Patient Education  2017 [employer].

## 2016-08-18 ENCOUNTER — Telehealth: Payer: Self-pay | Admitting: Cardiology

## 2016-08-18 NOTE — Telephone Encounter (Signed)
Pt calling stating she is not sure if she can do the Myoview in the morning She would like to know how important is this test She is having insurance issues, they told her last week she would be 100 percent covered and this week she is told it is 80/20  She is aware of the payment plan [facility] offers but would like to know her options on doing another test. Please advise.

## 2016-08-18 NOTE — Telephone Encounter (Signed)
Spoke with patient at length and reviewed reasons why her testing was ordered and what it looks for. She verbalized understanding of our conversation and will call back if she decides not to have the testing done.

## 2016-08-19 ENCOUNTER — Encounter
Admission: RE | Admit: 2016-08-19 | Discharge: 2016-08-19 | Disposition: A | Discharge status: Home / Self Care | Payer: 59 | Source: Ambulatory Visit | Attending: Cardiology | Admitting: Cardiology

## 2016-08-19 DIAGNOSIS — R9431 Abnormal electrocardiogram [ECG] [EKG]: Secondary | ICD-10-CM | POA: Insufficient documentation

## 2016-08-19 DIAGNOSIS — R079 Chest pain, unspecified: Secondary | ICD-10-CM

## 2016-08-19 LAB — NM MYOCAR MULTI W/SPECT W/WALL MOTION / EF
CHL CUP NUCLEAR SRS: 0
CHL CUP RESTING HR STRESS: 71 {beats}/min
CHL CUP STRESS STAGE 1 SPEED: 0 mph
CHL CUP STRESS STAGE 2 GRADE: 0 %
CHL CUP STRESS STAGE 2 HR: 85 {beats}/min
CHL CUP STRESS STAGE 3 HR: 122 {beats}/min
CHL CUP STRESS STAGE 4 SPEED: 2.5 mph
CHL CUP STRESS STAGE 5 GRADE: 0 %
CHL CUP STRESS STAGE 5 HR: 120 {beats}/min
CHL CUP STRESS STAGE 5 SPEED: 0 mph
CHL CUP STRESS STAGE 6 DBP: 49 mmHg
CSEPEW: 7 METS
CSEPPMHR: 89 %
Exercise duration (min): 7 min
Exercise duration (sec): 0 s
LVDIAVOL: 49 mL (ref 46–106)
LVSYSVOL: 10 mL
Peak HR: 142 {beats}/min
Percent HR: 89 %
SDS: 0
SSS: 0
Stage 1 Grade: 0 %
Stage 1 HR: 86 {beats}/min
Stage 2 Speed: 0 mph
Stage 3 Grade: 10 %
Stage 3 Speed: 1.7 mph
Stage 4 Grade: 12 %
Stage 4 HR: 142 {beats}/min
Stage 6 Grade: 0 %
Stage 6 HR: 96 {beats}/min
Stage 6 SBP: 150 mmHg
Stage 6 Speed: 0 mph
TID: 0.6

## 2016-08-19 MED ORDER — TECHNETIUM TC 99M TETROFOSMIN IV KIT
31.3900 | PACK | Freq: Once | INTRAVENOUS | Status: AC | PRN
  Administered 2016-08-19: 31.39 via INTRAVENOUS

## 2016-08-19 MED ORDER — TECHNETIUM TC 99M TETROFOSMIN IV KIT
13.6000 | PACK | Freq: Once | INTRAVENOUS | Status: AC | PRN
  Administered 2016-08-19: 13.6 via INTRAVENOUS

## 2016-08-20 ENCOUNTER — Ambulatory Visit: Payer: 59

## 2016-08-20 VITALS — BP 130/78 | HR 82

## 2016-08-20 DIAGNOSIS — I10 Essential (primary) hypertension: Secondary | ICD-10-CM

## 2016-08-25 ENCOUNTER — Other Ambulatory Visit: Payer: Self-pay

## 2016-08-25 ENCOUNTER — Ambulatory Visit (INDEPENDENT_AMBULATORY_CARE_PROVIDER_SITE_OTHER): Payer: 59

## 2016-08-25 DIAGNOSIS — R9431 Abnormal electrocardiogram [ECG] [EKG]: Secondary | ICD-10-CM

## 2016-08-25 DIAGNOSIS — R079 Chest pain, unspecified: Secondary | ICD-10-CM | POA: Diagnosis not present

## 2016-09-27 ENCOUNTER — Other Ambulatory Visit: Payer: Self-pay | Admitting: Family Medicine

## 2016-10-08 ENCOUNTER — Ambulatory Visit (INDEPENDENT_AMBULATORY_CARE_PROVIDER_SITE_OTHER): Payer: 59 | Admitting: Family Medicine

## 2016-10-08 ENCOUNTER — Encounter: Payer: Self-pay | Admitting: Family Medicine

## 2016-10-08 VITALS — BP 122/78 | HR 77 | Temp 98.2°F | Resp 14 | Wt 152.8 lb

## 2016-10-08 DIAGNOSIS — R1030 Lower abdominal pain, unspecified: Secondary | ICD-10-CM

## 2016-10-08 DIAGNOSIS — K921 Melena: Secondary | ICD-10-CM

## 2016-10-08 DIAGNOSIS — R8281 Pyuria: Secondary | ICD-10-CM

## 2016-10-08 DIAGNOSIS — N390 Urinary tract infection, site not specified: Secondary | ICD-10-CM

## 2016-10-08 DIAGNOSIS — I10 Essential (primary) hypertension: Secondary | ICD-10-CM | POA: Diagnosis not present

## 2016-10-08 LAB — POC HEMOCCULT BLD/STL (OFFICE/1-CARD/DIAGNOSTIC)
Card #1 Date: 5042018
Fecal Occult Blood, POC: NEGATIVE

## 2016-10-08 LAB — POCT URINALYSIS DIPSTICK
BILIRUBIN UA: NEGATIVE
Glucose, UA: NEGATIVE
KETONES UA: NEGATIVE
Nitrite, UA: NEGATIVE
PH UA: 5.5 (ref 5.0–8.0)
Protein, UA: NEGATIVE
RBC UA: NEGATIVE
SPEC GRAV UA: 1.02 (ref 1.010–1.025)
Urobilinogen, UA: 0.2 E.U./dL

## 2016-10-08 MED ORDER — HYDROCORTISONE ACETATE 25 MG RE SUPP: 25.0000 mg | Freq: Two times a day (BID) | RECTAL | 0 refills | Status: DC

## 2016-10-08 MED ORDER — NITROFURANTOIN MONOHYD MACRO 100 MG PO CAPS: 100.0000 mg | ORAL_CAPSULE | Freq: Two times a day (BID) | ORAL | 0 refills | Status: AC

## 2016-10-08 NOTE — Assessment & Plan Note (Signed)
Invited her to return with her BP cuff to have it checked at the same time we check her here with ours; discussed factors that influence BP, including salt, sedentary lifestyle, stress, snoring/sleep apnea, decongestants, etc.; she'll come by with her cuff and we'll adjust medicine if needed; she is fine today; discussed goal BP, recent controversy over target systolic; DASH guidelines encouraged

## 2016-10-08 NOTE — Progress Notes (Signed)
BP 122/78   Pulse 77   Temp 98.2 F (36.8 C) (Oral)   Resp 14   Wt 152 lb 12.8 oz (69.3 kg)   SpO2 97%   BMI 27.07 kg/m    Subjective:    Patient ID: Alexandria Hill, female    DOB: 1956-03-15, 61 y.o.   MRN: [id number]  HPI: Alexandria Hill is a 61 y.o. female  Chief Complaint  Patient presents with  . GI Problem    Pt has IBS; stool had some blood thursday and pain started wenesday.  Lower abdominal pain and gas.   HPI HTN; she takes the reading every day, 979G and 921 systolic; ; used the big cuff; was going to use 25 mg but using just half  Not eating much salty foods, not many frozen dinners; frozen veggies; no decongestants Does not feel washed out or weak or dizzy Not much alcohol at all; nonsmoker Not much more stress; job is not stressful; house done with foreclosure; new grandbaby in January Not exercising Sleeps 6-8 hours a night; does snore; had sleep apnea test years ago and did not have sleep apnea; lives with daughter and they hear me  She has irritable bowel syndrome; but on Tuesday she got off of her schedule; did not have her normal BM; was in a lot of lower abdominal pain on Wednesday; took Gas-X and that helped then got up during the night and thought she had blood in the stool and the tissue was pink when she wiped; daughter (nurse) looked at it too; agreed it was blood; last colonoscopy 2015; Dr. Allen Norris; does not remember if diverticulitis; no fevers; more gassy lately; same time as starting beta-blocker; no real change in diet; buys salad kits which has cabbage in it, does eat broccoli but not more than usual; cut back on sweets; has had hemorrhoids in the past; internal verge; reviewed colonoscopy report from Dec 05, 2014; next due in five years; no urinary symptoms  Depression screen [facility] 2/9 10/08/2016 08/11/2016 04/26/2016 04/24/2015  Decreased Interest 0 0 0 0  Down, Depressed, Hopeless 0 0 0 0  PHQ - 2 Score 0 0 0 0   Relevant past medical, surgical, family  and social history reviewed Past Medical History:  Diagnosis Date  . GERD (gastroesophageal reflux disease)   . Mitral valve regurgitation    mild  . Osteopenia   . Overweight (BMI 25.0-29.9) 08/11/2016  . Reflux    Past Surgical History:  Procedure Laterality Date  . CESAREAN SECTION  1987  . COLONOSCOPY WITH PROPOFOL N/A 12/05/2014   Procedure: COLONOSCOPY WITH PROPOFOL;  Surgeon: Lucilla Lame, MD;  Location: [facility] ENDOSCOPY;  Service: Endoscopy;  Laterality: N/A;   family history includes Aneurysm in her mother; Cancer in her brother and maternal grandmother; Depression in her sister and sister; Diabetes in her maternal aunt; Heart attack in her father; Heart disease in her father; Hypertension in her mother, sister, sister, and sister; Mental illness in her sister and sister; Mental retardation in her father; Obesity in her sister; Stroke in her mother. There is no history of COPD or Breast cancer.   Social History   Social History  . Marital status: Divorced    Spouse name: N/A  . Number of children: N/A  . Years of education: N/A   Occupational History  . Not on file.   Social History Main Topics  . Smoking status: Former Smoker    Packs/day: 0.50    Years: 10.00  Types: Cigarettes    Quit date: 04/23/1985  . Smokeless tobacco: Never Used  . Alcohol use No  . Drug use: No  . Sexual activity: Not on file   Other Topics Concern  . Not on file   Social History Narrative  . No narrative on file   Interim medical history since last visit reviewed. Allergies and medications reviewed  Review of Systems Per HPI unless specifically indicated above     Objective:    BP 122/78   Pulse 77   Temp 98.2 F (36.8 C) (Oral)   Resp 14   Wt 152 lb 12.8 oz (69.3 kg)   SpO2 97%   BMI 27.07 kg/m   Wt Readings from Last 3 Encounters:  10/08/16 152 lb 12.8 oz (69.3 kg)  08/12/16 154 lb (69.9 kg)  08/11/16 153 lb 14.4 oz (69.8 kg)    Physical Exam  Constitutional: She  appears well-developed and well-nourished.  HENT:  Mouth/Throat: Mucous membranes are normal.  Eyes: EOM are normal. No scleral icterus.  Cardiovascular: Normal rate and regular rhythm.   Pulmonary/Chest: Effort normal and breath sounds normal.  Abdominal: Soft. Bowel sounds are normal. She exhibits no distension. There is no tenderness. There is no guarding.  Genitourinary: Rectal exam shows internal hemorrhoid (at the 5 o'clock position with patient in the RIGHT lateral decubitus position). Rectal exam shows no external hemorrhoid, no fissure, no mass, no tenderness and guaiac negative stool.  Skin: She is not diaphoretic. No pallor.  Psychiatric: She has a normal mood and affect. Her behavior is normal. Her mood appears not anxious. She does not exhibit a depressed mood.   Results for orders placed or performed in visit on 10/08/16  POCT Urinalysis Dipstick  Result Value Ref Range   Color, UA yellow    Clarity, UA clear    Glucose, UA neg    Bilirubin, UA neg    Ketones, UA neg    Spec Grav, UA 1.020 1.010 - 1.025   Blood, UA neg    pH, UA 5.5 5.0 - 8.0   Protein, UA neg    Urobilinogen, UA 0.2 0.2 or 1.0 E.U./dL   Nitrite, UA neg    Leukocytes, UA Moderate (2+) (A) Negative  POC Hemoccult Bld/Stl (1-Cd Office Dx)  Result Value Ref Range   Card #1 Date [id number]    Fecal Occult Blood, POC Negative Negative      Assessment & Plan:   Problem List Items Addressed This Visit      Cardiovascular and Mediastinum   Essential hypertension, benign    Invited her to return with her BP cuff to have it checked at the same time we check her here with ours; discussed factors that influence BP, including salt, sedentary lifestyle, stress, snoring/sleep apnea, decongestants, etc.; she'll come by with her cuff and we'll adjust medicine if needed; she is fine today; discussed goal BP, recent controversy over target systolic; DASH guidelines encouraged       Other Visit Diagnoses    Lower  abdominal pain    -  Primary   Relevant Orders   POCT Urinalysis Dipstick (Completed)   Urine culture   Blood in stool       suspect the BRBPR was from internal hemorrhoid; will use anusol supp; discussed referral back to GI; she would like to watch s/s carefully, call if recurs   Relevant Orders   POC Hemoccult Bld/Stl (1-Cd Office Dx) (Completed)   Pyuria  positive LE in the urine; will start macrobid BID x 3 days; culture sent, pending; likely true true and unrelated to the BRBPR       Follow up plan: No Follow-up on file.  An after-visit summary was printed and given to the patient at [facility].  Please see the patient instructions which may contain other information and recommendations beyond what is mentioned above in the assessment and plan.  Meds ordered this encounter  Medications  . hydrocortisone (ANUSOL-HC) 25 MG suppository    Sig: Place 1 suppository (25 mg total) rectally 2 (two) times daily.    Dispense:  24 suppository    Refill:  0  . nitrofurantoin, macrocrystal-monohydrate, (MACROBID) 100 MG capsule    Sig: Take 1 capsule (100 mg total) by mouth 2 (two) times daily.    Dispense:  6 capsule    Refill:  0    Orders Placed This Encounter  Procedures  . Urine culture  . POCT Urinalysis Dipstick  . POC Hemoccult Bld/Stl (1-Cd Office Dx)

## 2016-10-08 NOTE — Patient Instructions (Addendum)
Please feel welcome to bring in your cuff and check it against ours here next week Your goal blood pressure is less than 140 mmHg on top or under 150 mmHg based on who you listen to Try to follow the DASH guidelines (DASH stands for Dietary Approaches to Stop Hypertension) Try to limit the sodium in your diet.  Ideally, consume less than 1.5 grams (less than 1,500mg ) per day. Do not add salt when cooking or at the table.  Check the sodium amount on labels when shopping, and choose items lower in sodium when given a choice. Avoid or limit foods that already contain a lot of sodium. Eat a diet rich in fruits and vegetables and whole grains.  Start the antibiotic Stay well-hydrated Use the suppository twice a day for the next 7-10 days Watch your symptoms and stools closely If symptoms don't completely resolve, call me and we'll get you to Dr. Allen Norris

## 2016-10-10 LAB — URINE CULTURE

## 2016-12-20 DIAGNOSIS — H40003 Preglaucoma, unspecified, bilateral: Secondary | ICD-10-CM | POA: Diagnosis not present

## 2017-03-08 DIAGNOSIS — Z23 Encounter for immunization: Secondary | ICD-10-CM | POA: Diagnosis not present

## 2017-03-28 ENCOUNTER — Other Ambulatory Visit: Payer: Self-pay | Admitting: Family Medicine

## 2017-03-28 DIAGNOSIS — H40003 Preglaucoma, unspecified, bilateral: Secondary | ICD-10-CM | POA: Diagnosis not present

## 2017-03-28 NOTE — Telephone Encounter (Signed)
Copied from [place] #[id number]. Topic: Quick Communication - See Telephone Encounter >> Mar 28, 2017  4:41 PM Bea Graff, NT wrote: CRM for notification. See Telephone encounter for:  03/28/17. Pt is calling to get a 90 day supply if her bp medicine-metoprolol succinate. She is wanting to use [facility] instead of [facility] for this rx. The number to call the rx in is [phone]

## 2017-04-01 MED ORDER — METOPROLOL SUCCINATE ER 25 MG PO TB24: 12.5000 mg | ORAL_TABLET | Freq: Every day | ORAL | 3 refills | Status: DC

## 2017-04-01 NOTE — Telephone Encounter (Signed)
Requesting new pharmacy and 90 day

## 2017-04-01 NOTE — Telephone Encounter (Signed)
Patient requested Rx refill 4 days ago; I just got request today Rx sent today I called patient and let her know refill had been taken care of

## 2017-05-02 ENCOUNTER — Encounter: Payer: Self-pay | Admitting: Family Medicine

## 2017-05-02 ENCOUNTER — Ambulatory Visit (INDEPENDENT_AMBULATORY_CARE_PROVIDER_SITE_OTHER): Payer: 59 | Admitting: Family Medicine

## 2017-05-02 VITALS — BP 136/82 | HR 82 | Temp 97.9°F | Resp 14 | Ht 63.38 in | Wt 154.4 lb

## 2017-05-02 DIAGNOSIS — Z124 Encounter for screening for malignant neoplasm of cervix: Secondary | ICD-10-CM

## 2017-05-02 DIAGNOSIS — Z0000 Encounter for general adult medical examination without abnormal findings: Secondary | ICD-10-CM | POA: Diagnosis not present

## 2017-05-02 DIAGNOSIS — Z1231 Encounter for screening mammogram for malignant neoplasm of breast: Secondary | ICD-10-CM

## 2017-05-02 DIAGNOSIS — I10 Essential (primary) hypertension: Secondary | ICD-10-CM

## 2017-05-02 DIAGNOSIS — Z1239 Encounter for other screening for malignant neoplasm of breast: Secondary | ICD-10-CM

## 2017-05-02 DIAGNOSIS — M8580 Other specified disorders of bone density and structure, unspecified site: Secondary | ICD-10-CM | POA: Diagnosis not present

## 2017-05-02 MED ORDER — METOPROLOL SUCCINATE ER 25 MG PO TB24: 25.0000 mg | ORAL_TABLET | Freq: Every day | ORAL | 3 refills | Status: DC

## 2017-05-02 NOTE — Assessment & Plan Note (Signed)
Increase blood pressure medicine and check BP and pulse; try to limit sodium

## 2017-05-02 NOTE — Patient Instructions (Addendum)
Increase the blood pressure medicine to a whole pill daily Monitor your BP and your pulse and let me know some readings in 1-2 weeks Normal heart rate is 60-100, and on your medicine I'd like it between 60-80 Try vitamin C (orange juice if not diabetic or vitamin C tablets) and drink green tea to help your immune system during your illness Get plenty of rest and hydration Try to get 7-8 hours of sleep a night

## 2017-05-02 NOTE — Progress Notes (Signed)
Patient ID: Alexandria Hill, female   DOB: 1955-09-07, 61 y.o.   MRN: [id number]   Subjective:   Alexandria Hill is a 61 y.o. female here for a complete physical exam  Interim issues since last visit: nothing major  USPSTF grade A and B recommendations Depression:  Depression screen [facility] 2/9 05/02/2017 10/08/2016 08/11/2016 04/26/2016 04/24/2015  Decreased Interest 0 0 0 0 0  Down, Depressed, Hopeless 0 0 0 0 0  PHQ - 2 Score 0 0 0 0 0   Hypertension: maybe something salty over the holiday weekend; measures at home, 140 and 150 at times BP Readings from Last 3 Encounters:  05/02/17 136/82  10/08/16 122/78  08/20/16 130/78   Obesity: Wt Readings from Last 3 Encounters:  05/02/17 154 lb 6.4 oz (70 kg)  10/08/16 152 lb 12.8 oz (69.3 kg)  08/12/16 154 lb (69.9 kg)   BMI Readings from Last 3 Encounters:  05/02/17 27.03 kg/m  10/08/16 27.07 kg/m  08/12/16 27.28 kg/m    Skin cancer: annual check-up with derm, sees her next week, Dr. Kellie Moor Lung cancer:  Quit smoking in her late 20's Breast cancer: coming up Colorectal cancer: due in 2021  BRCA gene screening: family hx of breast and/or ovarian cancer and/or metastatic prostate cancer? no Cervical cancer screening: no dangerous abnormal paps, just yeast infection HIV, hep B, hep C: already done STD testing and prevention (chl/gon/syphilis): not interested Intimate partner violence: no abuse Contraception: n/a Osteoporosis: ordered DEXA Fall prevention/vitamin D: discussed, taking multivitamin and calcium  Diet: pretty good eater Exercise: close to 150 minutes a week; 1-2 hours of walking at work, lunchtime Alcohol: less than 7, just occasionally Tobacco use: quit remotely Aspirin: taking daily aspirin Lipids: fasting today Lab Results  Component Value Date   CHOL 171 04/26/2016   CHOL 169 04/24/2015   Lab Results  Component Value Date   HDL 63 04/26/2016   HDL 53 04/24/2015   Lab Results  Component Value Date   LDLCALC 91 04/26/2016   LDLCALC 97 04/24/2015   Lab Results  Component Value Date   TRIG 84 04/26/2016   TRIG 93 04/24/2015   Lab Results  Component Value Date   CHOLHDL 2.7 04/26/2016   No results found for: LDLDIRECT Glucose: fasting today Glucose  Date Value Ref Range Status  07/21/2015 104 (H) 65 - 99 mg/dL Final  04/24/2015 86 65 - 99 mg/dL Final   Glucose, Bld  Date Value Ref Range Status  04/26/2016 86 65 - 99 mg/dL Final    Past Medical History:  Diagnosis Date  . GERD (gastroesophageal reflux disease)   . Mitral valve regurgitation    mild  . Osteopenia   . Overweight (BMI 25.0-29.9) 08/11/2016  . Reflux    Past Surgical History:  Procedure Laterality Date  . CESAREAN SECTION  1987  . COLONOSCOPY WITH PROPOFOL N/A 12/05/2014   Procedure: COLONOSCOPY WITH PROPOFOL;  Surgeon: Lucilla Lame, MD;  Location: [facility] ENDOSCOPY;  Service: Endoscopy;  Laterality: N/A;   Family History  Problem Relation Age of Onset  . Aneurysm Mother        brain  . Hypertension Mother   . Stroke Mother        possibly mini strokes  . Heart disease Father   . Heart attack Father   . Mental retardation Father   . Cancer Brother   . Diabetes Maternal Aunt   . Hypertension Sister   . Obesity Sister   . Mental illness Sister   .  Cancer Maternal Grandmother   . Hypertension Sister   . Mental illness Sister   . Depression Sister   . Hypertension Sister   . Depression Sister   . COPD Neg Hx   . Breast cancer Neg Hx    Social History   Tobacco Use  . Smoking status: Former Smoker    Packs/day: 0.50    Years: 10.00    Pack years: 5.00    Types: Cigarettes    Last attempt to quit: 04/23/1985    Years since quitting: 32.0  . Smokeless tobacco: Never Used  Substance Use Topics  . Alcohol use: Yes  . Drug use: No   Review of Systems  Objective:   Vitals:   05/02/17 0824  BP: 136/82  Pulse: 82  Resp: 14  Temp: 97.9 F (36.6 C)  TempSrc: Oral  SpO2: 99%  Weight:  154 lb 6.4 oz (70 kg)  Height: 5' 3.38" (1.61 m)   Body mass index is 27.03 kg/m. Wt Readings from Last 3 Encounters:  05/02/17 154 lb 6.4 oz (70 kg)  10/08/16 152 lb 12.8 oz (69.3 kg)  08/12/16 154 lb (69.9 kg)   Physical Exam  Constitutional: She appears well-developed and well-nourished.  HENT:  Head: Normocephalic and atraumatic.  Eyes: Conjunctivae and EOM are normal. Right eye exhibits no hordeolum. Left eye exhibits no hordeolum. No scleral icterus.  Neck: Carotid bruit is not present. No thyromegaly present.  Cardiovascular: Normal rate, regular rhythm, S1 normal, S2 normal and normal heart sounds.  No extrasystoles are present.  Pulmonary/Chest: Effort normal and breath sounds normal. No respiratory distress. Right breast exhibits no inverted nipple, no mass, no nipple discharge, no skin change and no tenderness. Left breast exhibits no inverted nipple, no mass, no nipple discharge, no skin change and no tenderness. Breasts are symmetrical.  Abdominal: Soft. Normal appearance and bowel sounds are normal. She exhibits no distension, no abdominal bruit, no pulsatile midline mass and no mass. There is no hepatosplenomegaly. There is no tenderness. No hernia.  Genitourinary: Uterus normal. Pelvic exam was performed with patient prone. There is no rash or lesion on the right labia. There is no rash or lesion on the left labia. Uterus is not tender. Cervix exhibits no motion tenderness, no discharge and no friability. Right adnexum displays no mass, no tenderness and no fullness. Left adnexum displays no mass, no tenderness and no fullness.  Musculoskeletal: Normal range of motion. She exhibits no edema.  Lymphadenopathy:       Head (right side): No submandibular adenopathy present.       Head (left side): No submandibular adenopathy present.    She has no cervical adenopathy.    She has no axillary adenopathy.  Neurological: She is alert. She displays no tremor. No cranial nerve  deficit. She exhibits normal muscle tone. Gait normal.  Skin: Skin is warm and dry. No bruising and no ecchymosis noted. No cyanosis. No pallor.  Psychiatric: Her speech is normal and behavior is normal. Thought content normal. Her mood appears not anxious. She does not exhibit a depressed mood.    Assessment/Plan:   Problem List Items Addressed This Visit      Cardiovascular and Mediastinum   Essential hypertension, benign    Increase blood pressure medicine and check BP and pulse; try to limit sodium      Relevant Medications   metoprolol succinate (TOPROL-XL) 25 MG 24 hr tablet     Musculoskeletal and Integument   Osteopenia  Relevant Orders   DG Bone Density     Other   Preventative health care - Primary    USPSTF grade A and B recommendations reviewed with patient; age-appropriate recommendations, preventive care, screening tests, etc discussed and encouraged; healthy living encouraged; see AVS for patient education given to patient       Relevant Orders   CBC with Differential/Platelet   COMPLETE METABOLIC PANEL WITH GFR   TSH   Lipid panel    Other Visit Diagnoses    Screening for breast cancer       Relevant Orders   MM Digital Screening   Screening for cervical cancer       Relevant Orders   Pap IG and HPV (high risk) DNA detection       Meds ordered this encounter  Medications  . metoprolol succinate (TOPROL-XL) 25 MG 24 hr tablet    Sig: Take 1 tablet (25 mg total) by mouth daily.    Dispense:  90 tablet    Refill:  3   Orders Placed This Encounter  Procedures  . DG Bone Density    Standing Status:   Future    Standing Expiration Date:   07/02/2018    Order Specific Question:   Reason for Exam (SYMPTOM  OR DIAGNOSIS REQUIRED)    Answer:   osteopenia    Order Specific Question:   Preferred imaging location?    Answer:   [facility]  . MM Digital Screening    Standing Status:   Future    Standing Expiration Date:   07/02/2018    Order  Specific Question:   Reason for Exam (SYMPTOM  OR DIAGNOSIS REQUIRED)    Answer:   screen for breast canhcer    Order Specific Question:   Preferred imaging location?    Answer:   [facility]  . CBC with Differential/Platelet  . COMPLETE METABOLIC PANEL WITH GFR  . TSH  . Lipid panel    Follow up plan: Return in about 1 year (around 05/02/2018) for complete physical.  An After Visit Summary was printed and given to the patient.

## 2017-05-02 NOTE — Assessment & Plan Note (Signed)
USPSTF grade A and B recommendations reviewed with patient; age-appropriate recommendations, preventive care, screening tests, etc discussed and encouraged; healthy living encouraged; see AVS for patient education given to patient  

## 2017-05-03 LAB — COMPLETE METABOLIC PANEL WITH GFR
AG Ratio: 1.5 (calc) (ref 1.0–2.5)
ALKALINE PHOSPHATASE (APISO): 66 U/L (ref 33–130)
ALT: 14 U/L (ref 6–29)
AST: 15 U/L (ref 10–35)
Albumin: 4.3 g/dL (ref 3.6–5.1)
BUN: 17 mg/dL (ref 7–25)
CALCIUM: 9.1 mg/dL (ref 8.6–10.4)
CO2: 31 mmol/L (ref 20–32)
CREATININE: 0.71 mg/dL (ref 0.50–0.99)
Chloride: 104 mmol/L (ref 98–110)
GFR, Est African American: 105 mL/min/{1.73_m2} (ref >=60)
GFR, Est Non African American: 91 mL/min/{1.73_m2} (ref >=60)
GLUCOSE: 89 mg/dL (ref 65–99)
Globulin: 2.9 g/dL (calc) (ref 1.9–3.7)
Potassium: 4.1 mmol/L (ref 3.5–5.3)
Sodium: 139 mmol/L (ref 135–146)
Total Bilirubin: 0.5 mg/dL (ref 0.2–1.2)
Total Protein: 7.2 g/dL (ref 6.1–8.1)

## 2017-05-03 LAB — CBC WITH DIFFERENTIAL/PLATELET
BASOS PCT: 0.7 %
Basophils Absolute: 38 cells/uL (ref 0–200)
EOS ABS: 70 {cells}/uL (ref 15–500)
EOS PCT: 1.3 %
HCT: 43.6 % (ref 35.0–45.0)
HEMOGLOBIN: 14.7 g/dL (ref 11.7–15.5)
LYMPHS ABS: 1285 {cells}/uL (ref 850–3900)
MCH: 30.1 pg (ref 27.0–33.0)
MCHC: 33.7 g/dL (ref 32.0–36.0)
MCV: 89.3 fL (ref 80.0–100.0)
MONOS PCT: 7.8 %
MPV: 8.9 fL (ref 7.5–12.5)
NEUTROS ABS: 3586 {cells}/uL (ref 1500–7800)
Neutrophils Relative %: 66.4 %
Platelets: 356 10*3/uL (ref 140–400)
RBC: 4.88 10*6/uL (ref 3.80–5.10)
RDW: 12 % (ref 11.0–15.0)
Total Lymphocyte: 23.8 %
WBC mixed population: 421 cells/uL (ref 200–950)
WBC: 5.4 10*3/uL (ref 3.8–10.8)

## 2017-05-03 LAB — LIPID PANEL
CHOL/HDL RATIO: 3.4 (calc) (ref <5.0)
CHOLESTEROL: 181 mg/dL (ref <200)
HDL: 54 mg/dL (ref >50)
LDL CHOLESTEROL (CALC): 108 mg/dL — AB
Non-HDL Cholesterol (Calc): 127 mg/dL (calc) (ref <130)
Triglycerides: 101 mg/dL (ref <150)

## 2017-05-03 LAB — TSH: TSH: 1.33 mIU/L (ref 0.40–4.50)

## 2017-05-05 LAB — PAP IG AND HPV HIGH-RISK: HPV DNA High Risk: NOT DETECTED

## 2017-05-09 DIAGNOSIS — H40003 Preglaucoma, unspecified, bilateral: Secondary | ICD-10-CM | POA: Diagnosis not present

## 2017-05-12 DIAGNOSIS — C44519 Basal cell carcinoma of skin of other part of trunk: Secondary | ICD-10-CM | POA: Diagnosis not present

## 2017-05-12 DIAGNOSIS — Z85828 Personal history of other malignant neoplasm of skin: Secondary | ICD-10-CM | POA: Diagnosis not present

## 2017-05-12 DIAGNOSIS — D485 Neoplasm of uncertain behavior of skin: Secondary | ICD-10-CM | POA: Diagnosis not present

## 2017-05-12 DIAGNOSIS — C44619 Basal cell carcinoma of skin of left upper limb, including shoulder: Secondary | ICD-10-CM | POA: Diagnosis not present

## 2017-06-01 DIAGNOSIS — C44619 Basal cell carcinoma of skin of left upper limb, including shoulder: Secondary | ICD-10-CM | POA: Diagnosis not present

## 2017-06-01 DIAGNOSIS — C44519 Basal cell carcinoma of skin of other part of trunk: Secondary | ICD-10-CM | POA: Diagnosis not present

## 2017-06-07 DIAGNOSIS — K589 Irritable bowel syndrome without diarrhea: Secondary | ICD-10-CM

## 2017-06-07 HISTORY — DX: Irritable bowel syndrome, unspecified: K58.9

## 2017-06-11 ENCOUNTER — Telehealth: Payer: 59 | Admitting: Nurse Practitioner

## 2017-06-11 DIAGNOSIS — N3000 Acute cystitis without hematuria: Secondary | ICD-10-CM

## 2017-06-11 MED ORDER — CIPROFLOXACIN HCL 500 MG PO TABS: 500.0000 mg | ORAL_TABLET | Freq: Two times a day (BID) | ORAL | 0 refills | Status: DC

## 2017-06-11 NOTE — Progress Notes (Signed)

## 2017-06-14 ENCOUNTER — Ambulatory Visit
Admission: RE | Admit: 2017-06-14 | Discharge: 2017-06-14 | Disposition: A | Discharge status: Home / Self Care | Payer: 59 | Source: Ambulatory Visit | Attending: Family Medicine | Admitting: Family Medicine

## 2017-06-14 ENCOUNTER — Other Ambulatory Visit: Payer: 59

## 2017-06-14 DIAGNOSIS — M8580 Other specified disorders of bone density and structure, unspecified site: Secondary | ICD-10-CM

## 2017-06-14 DIAGNOSIS — Z780 Asymptomatic menopausal state: Secondary | ICD-10-CM | POA: Insufficient documentation

## 2017-06-14 DIAGNOSIS — Z1231 Encounter for screening mammogram for malignant neoplasm of breast: Secondary | ICD-10-CM | POA: Insufficient documentation

## 2017-06-14 DIAGNOSIS — M8589 Other specified disorders of bone density and structure, multiple sites: Secondary | ICD-10-CM | POA: Diagnosis not present

## 2017-06-14 DIAGNOSIS — M8588 Other specified disorders of bone density and structure, other site: Secondary | ICD-10-CM | POA: Diagnosis not present

## 2017-06-14 DIAGNOSIS — Z1239 Encounter for other screening for malignant neoplasm of breast: Secondary | ICD-10-CM

## 2017-06-15 ENCOUNTER — Ambulatory Visit
Admission: RE | Admit: 2017-06-15 | Discharge: 2017-06-15 | Disposition: A | Discharge status: Home / Self Care | Payer: Commercial Managed Care - HMO | Source: Ambulatory Visit | Attending: Family Medicine | Admitting: Family Medicine

## 2017-06-15 ENCOUNTER — Telehealth: Payer: Self-pay

## 2017-06-15 ENCOUNTER — Other Ambulatory Visit: Payer: Self-pay | Admitting: Family Medicine

## 2017-06-15 ENCOUNTER — Ambulatory Visit: Payer: 59 | Admitting: Family Medicine

## 2017-06-15 ENCOUNTER — Encounter: Payer: Self-pay | Admitting: Family Medicine

## 2017-06-15 ENCOUNTER — Telehealth: Payer: Self-pay | Admitting: Family Medicine

## 2017-06-15 ENCOUNTER — Ambulatory Visit: Payer: Self-pay | Admitting: *Deleted

## 2017-06-15 VITALS — BP 128/80 | HR 86 | Temp 98.5°F | Ht 63.5 in | Wt 153.7 lb

## 2017-06-15 DIAGNOSIS — R350 Frequency of micturition: Secondary | ICD-10-CM | POA: Insufficient documentation

## 2017-06-15 DIAGNOSIS — R918 Other nonspecific abnormal finding of lung field: Secondary | ICD-10-CM | POA: Insufficient documentation

## 2017-06-15 DIAGNOSIS — R109 Unspecified abdominal pain: Secondary | ICD-10-CM

## 2017-06-15 DIAGNOSIS — R319 Hematuria, unspecified: Secondary | ICD-10-CM | POA: Diagnosis not present

## 2017-06-15 DIAGNOSIS — N132 Hydronephrosis with renal and ureteral calculous obstruction: Secondary | ICD-10-CM

## 2017-06-15 LAB — COMPLETE METABOLIC PANEL WITH GFR
AG RATIO: 1.5 (calc) (ref 1.0–2.5)
ALKALINE PHOSPHATASE (APISO): 69 U/L (ref 33–130)
ALT: 19 U/L (ref 6–29)
AST: 18 U/L (ref 10–35)
Albumin: 4.4 g/dL (ref 3.6–5.1)
BUN: 16 mg/dL (ref 7–25)
CALCIUM: 9.8 mg/dL (ref 8.6–10.4)
CO2: 26 mmol/L (ref 20–32)
Chloride: 104 mmol/L (ref 98–110)
Creat: 0.83 mg/dL (ref 0.50–0.99)
GFR, EST NON AFRICAN AMERICAN: 75 mL/min/{1.73_m2} (ref >=60)
GFR, Est African American: 87 mL/min/{1.73_m2} (ref >=60)
Globulin: 3 g/dL (calc) (ref 1.9–3.7)
Glucose, Bld: 122 mg/dL — ABNORMAL HIGH (ref 65–99)
POTASSIUM: 3.8 mmol/L (ref 3.5–5.3)
Sodium: 139 mmol/L (ref 135–146)
Total Bilirubin: 0.6 mg/dL (ref 0.2–1.2)
Total Protein: 7.4 g/dL (ref 6.1–8.1)

## 2017-06-15 LAB — CBC WITH DIFFERENTIAL/PLATELET
Basophils Absolute: 29 cells/uL (ref 0–200)
Basophils Relative: 0.3 %
EOS PCT: 0.2 %
Eosinophils Absolute: 19 cells/uL (ref 15–500)
HCT: 42.3 % (ref 35.0–45.0)
Hemoglobin: 14.7 g/dL (ref 11.7–15.5)
Lymphs Abs: 1507 cells/uL (ref 850–3900)
MCH: 30.3 pg (ref 27.0–33.0)
MCHC: 34.8 g/dL (ref 32.0–36.0)
MCV: 87.2 fL (ref 80.0–100.0)
MPV: 9.1 fL (ref 7.5–12.5)
Monocytes Relative: 6.5 %
Neutro Abs: 7421 cells/uL (ref 1500–7800)
Neutrophils Relative %: 77.3 %
PLATELETS: 364 10*3/uL (ref 140–400)
RBC: 4.85 10*6/uL (ref 3.80–5.10)
RDW: 12.5 % (ref 11.0–15.0)
TOTAL LYMPHOCYTE: 15.7 %
WBC mixed population: 624 cells/uL (ref 200–950)
WBC: 9.6 10*3/uL (ref 3.8–10.8)

## 2017-06-15 LAB — POCT URINALYSIS DIPSTICK
Bilirubin, UA: NEGATIVE
Glucose, UA: NEGATIVE
Ketones, UA: NEGATIVE
NITRITE UA: NEGATIVE
PH UA: 6 (ref 5.0–8.0)
PROTEIN UA: NEGATIVE
SPEC GRAV UA: 1.02 (ref 1.010–1.025)
UROBILINOGEN UA: 0.2 U/dL

## 2017-06-15 MED ORDER — TAMSULOSIN HCL 0.4 MG PO CAPS: 0.4000 mg | ORAL_CAPSULE | Freq: Every day | ORAL | 0 refills | Status: DC

## 2017-06-15 MED ORDER — MELOXICAM 15 MG PO TABS: 15.0000 mg | ORAL_TABLET | Freq: Every day | ORAL | 0 refills | Status: DC

## 2017-06-15 NOTE — Telephone Encounter (Signed)
Spoke with patient regarding results via telephone:  Per CT -  "Obstructing 2 mm calculus at the left ureterovesical junction. Not clearly visible on scout image. Associated hydronephrosis and mild perinephric soft tissue stranding."  Urgent referral to Urology is placed. Patient waiting in [facility] while we schedule appointment.  Advised to remain NPO until further instruction.

## 2017-06-15 NOTE — Progress Notes (Signed)
Name: Alexandria Hill   MRN: [id number]    DOB: 1955/12/18   Date:06/15/2017       Progress Note  Subjective  Chief Complaint  Chief Complaint  Patient presents with  . Urinary Tract Infection    sx started on Friday, did e visit and started treatment w/ Cipro, last night bad RT flank pain     HPI  Patient presents with concern for UTI x5 days and possible kidney stone x1 day.  She did an e-visit for UTI - started 5-day course of Cipro 5 days ago (last dose today).  Last night she started having LEFT low-to-mid back pain that she describes as sharp.  She was unable to sleep last night because of the pain, though she rates it as a 2-3/10 now. No recent injury or overuse to the area; denies NVD.  She has never had a kidney stone in the past; did have small "speck" of pink on her tissue paper after urinating last night - no clots in urine.  Patient Active Problem List   Diagnosis Date Noted  . Chest pain 08/11/2016  . Essential hypertension, benign 08/11/2016  . Overweight (BMI 25.0-29.9) 08/11/2016  . Heartburn 08/11/2016  . Breast cancer screening 04/24/2015  . Preventative health care 04/24/2015  . Varicella exposure 04/24/2015  . Osteopenia     Social History   Tobacco Use  . Smoking status: Former Smoker    Packs/day: 0.50    Years: 10.00    Pack years: 5.00    Types: Cigarettes    Last attempt to quit: 04/23/1985    Years since quitting: 32.1  . Smokeless tobacco: Never Used  Substance Use Topics  . Alcohol use: Yes     Current Outpatient Medications:  .  acetaminophen (TYLENOL) 500 MG tablet, Take 500 mg by mouth every 6 (six) hours as needed., Disp: , Rfl:  .  aspirin 81 MG tablet, Take 81 mg by mouth daily., Disp: , Rfl:  .  Calcium Citrate-Vitamin D 200-250 MG-UNIT TABS, Take by mouth daily., Disp: , Rfl:  .  ciprofloxacin (CIPRO) 500 MG tablet, Take 1 tablet (500 mg total) by mouth 2 (two) times daily., Disp: 10 tablet, Rfl: 0 .  Glucosamine-Chondroitin (OSTEO  BI-FLEX REGULAR STRENGTH PO), Take by mouth daily. , Disp: , Rfl:  .  metoprolol succinate (TOPROL-XL) 25 MG 24 hr tablet, Take 1 tablet (25 mg total) by mouth daily., Disp: 90 tablet, Rfl: 3 .  Multiple Vitamin (MULTIVITAMIN) capsule, Take 1 capsule by mouth daily., Disp: , Rfl:  .  vitamin C (ASCORBIC ACID) 500 MG tablet, Take 500 mg by mouth daily., Disp: , Rfl:  .  CALCIUM PO, Take by mouth., Disp: , Rfl:  .  hydrocortisone (ANUSOL-HC) 25 MG suppository, Place 1 suppository (25 mg total) rectally 2 (two) times daily. (Patient not taking: Reported on 05/02/2017), Disp: 24 suppository, Rfl: 0 .  ranitidine (ZANTAC) 150 MG tablet, Take 1 tablet (150 mg total) by mouth 2 (two) times daily. (Patient not taking: Reported on 05/02/2017), Disp: 60 tablet, Rfl: 3  No Known Allergies  ROS  Constitutional: Negative for fever or weight change.  Respiratory: Negative for cough and shortness of breath.   Cardiovascular: Negative for chest pain or palpitations.  Gastrointestinal: Negative for abdominal pain, no bowel changes.  Musculoskeletal: Negative for gait problem or joint swelling.  Skin: Negative for rash.  Neurological: Negative for dizziness or headache.  No other specific complaints in a complete review of systems (except  as listed in HPI above).  Objective  Vitals:   06/15/17 1102  BP: 128/80  Pulse: 86  Temp: 98.5 F (36.9 C)  TempSrc: Oral  SpO2: 98%  Weight: 153 lb 11.2 oz (69.7 kg)  Height: 5' 3.5" (1.613 m)   Body mass index is 26.8 kg/m.  Nursing Note and Vital Signs reviewed.  Physical Exam  Constitutional: Patient appears well-developed and well-nourished. Obese No distress.  HEENT: head atraumatic, normocephalic Cardiovascular: Normal rate, regular rhythm, S1/S2 present.  No murmur or rub heard. No BLE edema. Pulmonary/Chest: Effort normal and breath sounds clear. No respiratory distress or retractions. Abdominal: Soft and tender to BLQ - worse on the left, bowel  sounds present x4 quadrants.  Equivocal CVA Tenderness. Psychiatric: Patient has a normal mood and affect. behavior is normal. Judgment and thought content normal.  Recent Results (from the past 2160 hour(s))  CBC with Differential/Platelet     Status: None   Collection Time: 05/02/17  9:02 AM  Result Value Ref Range   WBC 5.4 3.8 - 10.8 Thousand/uL   RBC 4.88 3.80 - 5.10 Million/uL   Hemoglobin 14.7 11.7 - 15.5 g/dL   HCT 43.6 35.0 - 45.0 %   MCV 89.3 80.0 - 100.0 fL   MCH 30.1 27.0 - 33.0 pg   MCHC 33.7 32.0 - 36.0 g/dL   RDW 12.0 11.0 - 15.0 %   Platelets 356 140 - 400 Thousand/uL   MPV 8.9 7.5 - 12.5 fL   Neutro Abs 3,586 1,500 - 7,800 cells/uL   Lymphs Abs 1,285 850 - 3,900 cells/uL   WBC mixed population 421 200 - 950 cells/uL   Eosinophils Absolute 70 15 - 500 cells/uL   Basophils Absolute 38 0 - 200 cells/uL   Neutrophils Relative % 66.4 %   Total Lymphocyte 23.8 %   Monocytes Relative 7.8 %   Eosinophils Relative 1.3 %   Basophils Relative 0.7 %  COMPLETE METABOLIC PANEL WITH GFR     Status: None   Collection Time: 05/02/17  9:02 AM  Result Value Ref Range   Glucose, Bld 89 65 - 99 mg/dL    Comment: .            Fasting reference interval .    BUN 17 7 - 25 mg/dL   Creat 0.71 0.50 - 0.99 mg/dL    Comment: For patients >83 years of age, the reference limit for Creatinine is approximately 13% higher for people identified as African-American. .    GFR, Est Non African American 91 > OR = 60 mL/min/1.56m2   GFR, Est African American 105 > OR = 60 mL/min/1.57m2   BUN/Creatinine Ratio NOT APPLICABLE 6 - 22 (calc)   Sodium 139 135 - 146 mmol/L   Potassium 4.1 3.5 - 5.3 mmol/L   Chloride 104 98 - 110 mmol/L   CO2 31 20 - 32 mmol/L   Calcium 9.1 8.6 - 10.4 mg/dL   Total Protein 7.2 6.1 - 8.1 g/dL   Albumin 4.3 3.6 - 5.1 g/dL   Globulin 2.9 1.9 - 3.7 g/dL (calc)   AG Ratio 1.5 1.0 - 2.5 (calc)   Total Bilirubin 0.5 0.2 - 1.2 mg/dL   Alkaline phosphatase (APISO) 66  33 - 130 U/L   AST 15 10 - 35 U/L   ALT 14 6 - 29 U/L  TSH     Status: None   Collection Time: 05/02/17  9:02 AM  Result Value Ref Range   TSH 1.33 0.40 -  4.50 mIU/L  Lipid panel     Status: Abnormal   Collection Time: 05/02/17  9:02 AM  Result Value Ref Range   Cholesterol 181 <200 mg/dL   HDL 54 >50 mg/dL   Triglycerides 101 <150 mg/dL   LDL Cholesterol (Calc) 108 (H) mg/dL (calc)    Comment: Reference range: <100 . Desirable range <100 mg/dL for primary prevention;   <70 mg/dL for patients with CHD or diabetic patients  with > or = 2 CHD risk factors. Marland Kitchen LDL-C is now calculated using the Martin-Hopkins  calculation, which is a validated novel method providing  better accuracy than the Friedewald equation in the  estimation of LDL-C.  Cresenciano Genre et al. Annamaria Helling. 9735;329(92): 2061-2068  (http://education.[employer].com/faq/FAQ164)    Total CHOL/HDL Ratio 3.4 <5.0 (calc)   Non-HDL Cholesterol (Calc) 127 <130 mg/dL (calc)    Comment: For patients with diabetes plus 1 major ASCVD risk  factor, treating to a non-HDL-C goal of <100 mg/dL  (LDL-C of <70 mg/dL) is considered a therapeutic  option.   Pap IG and HPV (high risk) DNA detection     Status: None   Collection Time: 05/02/17  9:08 AM  Result Value Ref Range   Clinical Information:      Comment: Other high risk factor, specify NG    LMP:      Comment: 05/02/2005   PREV. PAP:      Comment: NONE GIVEN   PREV. BX:      Comment: NONE GIVEN   HPV DNA Probe-Source      Comment: Endocervix   STATEMENT OF ADEQUACY:      Comment: Satisfactory for evaluation. Endocervical/transformation zone component present.    INTERPRETATION/RESULT:      Comment: Negative for intraepithelial lesion or malignancy.   Comment:      Comment: This Pap test has been evaluated with computer assisted technology.    CYTOTECHNOLOGIST:      Comment: JRW, CT(ASCP) CT screening location: [address], [place], [place]  [address]    HPV DNA High Risk Not Detected Not Detect    Comment: This test was performed using the APTIMA HPV Assay ([employer] . This assay detects E6/E7 viral messenger RNA (mRNA) from 14 high-risk HPV types (16,18,31,33,35,39,45,51,52,56,58,59,66,68). . The analytical performance characteristics of this assay have been determined by [facility]. The modifications have not been cleared or approved by the FDA. This assay has been validated pursuant to the CLIA regulations and is used for clinical purposes. EXPLANATORY NOTE:  . The Pap is a screening test for cervical cancer. It is  not a diagnostic test and is subject to false negative  and false positive results. It is most reliable when a  satisfactory sample, regularly obtained, is submitted  with relevant clinical findings and history, and when  the Pap result is evaluated along with historic and  current clinical information. Marland Kitchen   POCT urinalysis dipstick     Status: Abnormal   Collection Time: 06/15/17 11:44 AM  Result Value Ref Range   Color, UA pale yellow    Clarity, UA clear    Glucose, UA neg    Bilirubin, UA neg    Ketones, UA neg    Spec Grav, UA 1.020 1.010 - 1.025   Blood, UA LARGE    pH, UA 6.0 5.0 - 8.0   Protein, UA neg    Urobilinogen, UA 0.2 0.2 or 1.0 E.U./dL   Nitrite, UA neg    Leukocytes, UA Trace (  A) Negative   Appearance clear    Odor none      Assessment & Plan  1. Flank pain - POCT urinalysis dipstick - CT RENAL STONE STUDY; Future  2. Urinary frequency - POCT urinalysis dipstick - CT RENAL STONE STUDY; Future  3. Hematuria, unspecified type - CT RENAL STONE STUDY; Future  -Red flags and when to present for emergency care or RTC including fever >101.22F, severe abdominal pain, nausea or vomiting, new/worsening/un-resolving symptoms, reviewed with patient at time of visit. Follow up and care instructions discussed and provided in AVS.

## 2017-06-15 NOTE — Telephone Encounter (Signed)
Patient has appointment with Zara Council PA-C with [facility] at 2:30pm on 06/16/2016.  Advised to complete her course of Cipro today.  Mobic and Flomax sent in in short course to help with pain while she awaits appointment.

## 2017-06-15 NOTE — Patient Instructions (Addendum)
Please go to [place] of the hospital for CT Renal Stone Study at 12:45.

## 2017-06-15 NOTE — Telephone Encounter (Signed)
Called pt informed her of the need to come by the office for blood work per Raelyn Ensign, pt gave verbal understanding and states she will be here soon.

## 2017-06-15 NOTE — Progress Notes (Signed)
Pt to return to office for labs to be available for urology appt tomorrow.

## 2017-06-15 NOTE — Telephone Encounter (Signed)
Pt had e-visit on 06/11/17 due to urinary frequency and was prescribed antibiotic; on 06/14/17 about 2130 she started having back pain on the right side, and she has 1 day of antibiotic left; pt states that tylenol has helped her back pain some; per nurse triage pt offered and accepted appointment with Raelyn Ensign at 1100; pt verbalizes understanding; will route to [facility] pool for notification of this upcoming appointment.   Reason for Disposition . [1] Side (flank) or lower back pain AND [2] new onset since starting antibiotics  Answer Assessment - Initial Assessment Questions 1. ANTIBIOTIC: "What antibiotic are you taking?" "How many times per day?"     Cipro twice daily  2. DURATION: "When was the antibiotic started?"     06/11/17 3. MAIN SYMPTOM: "What is the main symptom you are concerned about?"     Left side lower back pain 4. FEVER: "Do you have a fever?" If so, ask: "What is it, how was it measured, and when did it start?"    Not taken 5. OTHER SYMPTOMS: "Do you have any other symptoms?" (e.g., flank pain, vaginal discharge, blood in urine)     "tiny spot of pink on toliet tissue when she wiped"  Protocols used: URINARY TRACT INFECTION ON ANTIBIOTIC FOLLOW-UP CALL - FEMALE-A-AH

## 2017-06-16 ENCOUNTER — Encounter: Payer: Self-pay | Admitting: Urology

## 2017-06-16 ENCOUNTER — Ambulatory Visit: Payer: 59 | Admitting: Urology

## 2017-06-16 VITALS — BP 132/77 | HR 81 | Ht 63.0 in | Wt 153.0 lb

## 2017-06-16 DIAGNOSIS — N201 Calculus of ureter: Secondary | ICD-10-CM

## 2017-06-16 DIAGNOSIS — N132 Hydronephrosis with renal and ureteral calculous obstruction: Secondary | ICD-10-CM

## 2017-06-16 DIAGNOSIS — N200 Calculus of kidney: Secondary | ICD-10-CM

## 2017-06-16 LAB — URINALYSIS, COMPLETE
Bilirubin, UA: NEGATIVE
Glucose, UA: NEGATIVE
Ketones, UA: NEGATIVE
Nitrite, UA: NEGATIVE
PH UA: 5.5 (ref 5.0–7.5)
PROTEIN UA: NEGATIVE
Specific Gravity, UA: 1.01 (ref 1.005–1.030)
Urobilinogen, Ur: 0.2 mg/dL (ref 0.2–1.0)

## 2017-06-16 LAB — MICROSCOPIC EXAMINATION

## 2017-06-16 MED ORDER — TAMSULOSIN HCL 0.4 MG PO CAPS: 0.4000 mg | ORAL_CAPSULE | Freq: Every day | ORAL | 0 refills | Status: DC

## 2017-06-16 MED ORDER — OXYCODONE-ACETAMINOPHEN 5-325 MG PO TABS: 1.0000 | ORAL_TABLET | Freq: Four times a day (QID) | ORAL | 0 refills | Status: DC | PRN

## 2017-06-16 NOTE — Progress Notes (Signed)
06/16/2017 3:09 PM   Alexandria Hill 1956-01-31 [id number]  Referring provider: Arnetha Courser, MD [address] [place], [place] [address]  Chief Complaint  Patient presents with  . Nephrolithiasis    New Patient    HPI: Alexandria Hill is a 62 year old female who presents in consultation at the request of Dr. Sanda Klein for evaluation of nephrolithiasis.  She had an ED visit on 06/11/2017 with a 2-day history of dysuria and cloudy urine.  She was started on Cipro.  On 1/8 she had onset of left flank pain which was described as severe and intermittent.  There are no identifiable precipitating, aggravating or alleviating factors.  She denied fever, chills and had some nausea without vomiting.  A stone protocol CT performed on 06/15/2017 showed a 2 mm left UVJ calculus with mild hydronephrosis.  She also had 4 and 5 mm nonobstructing left renal calculi.  She denies previous history of stone disease.  Her flank pain has been minimal the past 48 hours.  She still has mild urinary frequency and urgency.  She was given prescriptions for tamsulosin and meloxicam.  PMH: Past Medical History:  Diagnosis Date  . GERD (gastroesophageal reflux disease)   . Mitral valve regurgitation    mild  . Osteopenia   . Overweight (BMI 25.0-29.9) 08/11/2016  . Reflux     Surgical History: Past Surgical History:  Procedure Laterality Date  . CESAREAN SECTION  1987  . COLONOSCOPY WITH PROPOFOL N/A 12/05/2014   Procedure: COLONOSCOPY WITH PROPOFOL;  Surgeon: Lucilla Lame, MD;  Location: [facility] ENDOSCOPY;  Service: Endoscopy;  Laterality: N/A;    Home Medications:  Allergies as of 06/16/2017   No Known Allergies     Medication List        Accurate as of 06/16/17  3:09 PM. Always use your most recent med list.          acetaminophen 500 MG tablet Commonly known as:  TYLENOL Take 500 mg by mouth every 6 (six) hours as needed.   aspirin 81 MG tablet Take 81 mg by mouth daily.   Calcium  Citrate-Vitamin D 200-250 MG-UNIT Tabs Take by mouth daily.   meloxicam 15 MG tablet Commonly known as:  MOBIC Take 1 tablet (15 mg total) by mouth daily.   metoprolol succinate 25 MG 24 hr tablet Commonly known as:  TOPROL-XL Take 1 tablet (25 mg total) by mouth daily.   multivitamin capsule Take 1 capsule by mouth daily.   OSTEO BI-FLEX REGULAR STRENGTH PO Take by mouth daily.   ranitidine 150 MG tablet Commonly known as:  ZANTAC Take 1 tablet (150 mg total) by mouth 2 (two) times daily.   tamsulosin 0.4 MG Caps capsule Commonly known as:  FLOMAX Take 1 capsule (0.4 mg total) by mouth daily.   vitamin C 500 MG tablet Commonly known as:  ASCORBIC ACID Take 500 mg by mouth daily.       Allergies: No Known Allergies  Family History: Family History  Problem Relation Age of Onset  . Aneurysm Mother        brain  . Hypertension Mother   . Stroke Mother        possibly mini strokes  . Heart disease Father   . Heart attack Father   . Mental retardation Father   . Cancer Brother   . Diabetes Maternal Aunt   . Hypertension Sister   . Obesity Sister   . Mental illness Sister   . Cancer Maternal  Grandmother   . Hypertension Sister   . Mental illness Sister   . Depression Sister   . Hypertension Sister   . Depression Sister   . COPD Neg Hx   . Breast cancer Neg Hx     Social History:  reports that she quit smoking about 32 years ago. Her smoking use included cigarettes. She has a 5.00 pack-year smoking history. she has never used smokeless tobacco. She reports that she drinks alcohol. She reports that she does not use drugs.  ROS: UROLOGY Frequent Urination?: Yes Hard to postpone urination?: No Burning/pain with urination?: No Get up at night to urinate?: Yes Leakage of urine?: No Urine stream starts and stops?: No Trouble starting stream?: No Do you have to strain to urinate?: No Blood in urine?: Yes Urinary tract infection?: Yes Sexually transmitted  disease?: No Injury to kidneys or bladder?: No Painful intercourse?: No Weak stream?: No Currently pregnant?: No Vaginal bleeding?: No Last menstrual period?: n  Gastrointestinal Nausea?: Yes Vomiting?: No Indigestion/heartburn?: No Diarrhea?: Yes Constipation?: No  Constitutional Fever: No Night sweats?: No Weight loss?: No Fatigue?: No  Skin Skin rash/lesions?: No Itching?: No  Eyes Blurred vision?: No Double vision?: No  Ears/Nose/Throat Sore throat?: No Sinus problems?: No  Hematologic/Lymphatic Swollen glands?: No Easy bruising?: No  Cardiovascular Leg swelling?: No Chest pain?: No  Respiratory Cough?: No Shortness of breath?: No  Endocrine Excessive thirst?: No  Musculoskeletal Back pain?: Yes Joint pain?: No  Neurological Headaches?: No Dizziness?: No  Psychologic Depression?: No Anxiety?: No  Physical Exam: BP 132/77   Pulse 81   Ht 5\' 3"  (1.6 m)   Wt 153 lb (69.4 kg)   BMI 27.10 kg/m   Constitutional:  Alert and oriented, No acute distress. HEENT: [place] AT, moist mucus membranes.  Trachea midline, no masses. Cardiovascular: No clubbing, cyanosis, or edema. Respiratory: Normal respiratory effort, no increased work of breathing. GI: Abdomen is soft, nontender, nondistended, no abdominal masses GU: No CVA tenderness.  Skin: No rashes, bruises or suspicious lesions. Lymph: No cervical or inguinal adenopathy. Neurologic: Grossly intact, no focal deficits, moving all 4 extremities. Psychiatric: Normal mood and affect.  Laboratory Data: Lab Results  Component Value Date   WBC 9.6 06/15/2017   HGB 14.7 06/15/2017   HCT 42.3 06/15/2017   MCV 87.2 06/15/2017   PLT 364 06/15/2017    Lab Results  Component Value Date   CREATININE 0.83 06/15/2017    Urinalysis Lab Results  Component Value Date   SPECGRAV 1.020 06/15/2017   PHUR 6.0 06/15/2017   COLORU pale yellow 06/15/2017   APPEARANCEUR Cloudy (A) 07/21/2015   LEUKOCYTESUR  Trace (A) 06/15/2017   PROTEINUR neg 06/15/2017   GLUCOSEU Negative 07/21/2015   KETONESU neg 06/15/2017   RBCU 1+ (A) 07/21/2015   BILIRUBINUR neg 06/15/2017   UUROB 0.2 07/21/2015   NITRITE neg 06/15/2017    Lab Results  Component Value Date   LABMICR See below: 07/21/2015   WBCUA 11-30 (A) 07/21/2015   RBCUA 3-10 (A) 07/21/2015   LABEPIT 0-10 07/21/2015   BACTERIA Few 07/21/2015    Pertinent Imaging: CT images personally reviewed  Results for orders placed during the hospital encounter of 06/15/17  CT RENAL STONE STUDY   Narrative CLINICAL DATA:  Severe left flank pain for 2 days with nausea. Microscopic hematuria. History irritable bowel syndrome and Cesarean section.  EXAM: CT ABDOMEN AND PELVIS WITHOUT CONTRAST  TECHNIQUE: Multidetector CT imaging of the abdomen and pelvis was performed following the  standard protocol without IV contrast.  COMPARISON:  None.  FINDINGS: Lower chest: Non calcified, well-circumscribed 4 mm left lower lobe pulmonary nodule on image 6. There is an additional tiny nodule on image 8. The lung bases are otherwise clear. No significant pleural or pericardial effusion.  Hepatobiliary: Tiny low-density lesion in the dome of the left hepatic lobe on image number 10, likely benign. The liver otherwise appears unremarkable as imaged in the noncontrast state. No evidence of gallstones, gallbladder wall thickening or biliary dilatation.  Pancreas: Unremarkable. No pancreatic ductal dilatation or surrounding inflammatory changes.  Spleen: Scattered small calcified granulomas. Otherwise unremarkable.  Adrenals/Urinary Tract: Both adrenal glands appear normal. There are 3 nonobstructing calculi in the lower pole of the left kidney, measuring up to 5 mm in diameter. There is left-sided hydronephrosis and hydroureter secondary to an obstructing 2 mm calculus at the left ureterovesical junction (image 77). There is mild perinephric soft  tissue stranding on the left. No right-sided urinary tract calculi. In the interpolar region of the right kidney, there is a tiny fat density lesion consistent with an incidental angiomyolipoma, best seen on coronal image 57. The bladder is nearly empty but demonstrates no abnormality.  Stomach/Bowel: No evidence of bowel wall thickening, distention or surrounding inflammatory change. The appendix appears normal.  Vascular/Lymphatic: There are no enlarged abdominal or pelvic lymph nodes. Minimal aortoiliac atherosclerosis.  Reproductive: The uterus and adnexa appear unremarkable.  Other: No evidence of abdominal wall mass or hernia. No ascites.  Musculoskeletal: No acute or significant osseous findings. There are degenerative changes within the spine which are most advanced at L1-2.  IMPRESSION: 1. Obstructing 2 mm calculus at the left ureterovesical junction. Not clearly visible on scout image. Associated hydronephrosis and mild perinephric soft tissue stranding. 2. Nonobstructing left renal calculi. 3. Tiny nodules at the left lung base. This appearance is almost certainly benign, and no dedicated follow-up is required if this patient is low risk for bronchogenic carcinoma (and has no known or suspected primary neoplasm). Non-contrast chest CT can be considered in 12 months if patient is high-risk. This recommendation follows the consensus statement: Guidelines for Management of Incidental Pulmonary Nodules Detected on CT Images: From the Fleischner Society 2017; Radiology 2017; 284:228-243.   Electronically Signed   By: Richardean Sale M.D.   On: 06/15/2017 12:59     Assessment & Plan:    1. Ureteral calculus 2 mm left UVJ calculus.  She was informed there is an excellent chance this stone should pass.  Tamsulosin was refilled.  She was also given an Rx for oxycodone in the event that she should have recurrent renal colic.  2. Nephrolithiasis Nonobstructing left  renal calculi which are larger than her ureteral calculus.  We discussed options of observation, shockwave lithotripsy and ureteroscopic removal.  Will follow-up with a KUB for further discussion.  - Urinalysis, Complete    Abbie Sons, MD  [facility] [address] [place], [place] [address] [phone]

## 2017-07-05 ENCOUNTER — Encounter: Payer: Self-pay | Admitting: Urology

## 2017-07-05 ENCOUNTER — Ambulatory Visit (INDEPENDENT_AMBULATORY_CARE_PROVIDER_SITE_OTHER): Payer: 59 | Admitting: Urology

## 2017-07-05 ENCOUNTER — Ambulatory Visit
Admission: RE | Admit: 2017-07-05 | Discharge: 2017-07-05 | Disposition: A | Discharge status: Home / Self Care | Payer: 59 | Source: Ambulatory Visit | Attending: Urology | Admitting: Urology

## 2017-07-05 VITALS — BP 171/91 | HR 73 | Ht 63.0 in | Wt 153.6 lb

## 2017-07-05 DIAGNOSIS — N200 Calculus of kidney: Secondary | ICD-10-CM

## 2017-07-05 DIAGNOSIS — N201 Calculus of ureter: Secondary | ICD-10-CM | POA: Insufficient documentation

## 2017-07-05 NOTE — H&P (View-Only) (Signed)
07/05/2017 7:57 PM   Arsenio Katz 1956/04/22 [id number]  Referring provider: Arnetha Courser, MD [address] [place], [place] [address]  Chief Complaint  Patient presents with  . Follow-up    HPI: 62 year old female presents for follow-up.  She was seen on 06/16/2017 after an episode of left renal colic secondary to a 2 mm left UVJ calculus.  She also had 4 and 5 mm nonobstructing left renal calculi.  She states she passed the 2 mm stone later that evening.  She desires prophylactic treatment of her nonobstructing renal calculi as she had significant colic with a 2 mm stone.  She is presently asymptomatic.   PMH: Past Medical History:  Diagnosis Date  . GERD (gastroesophageal reflux disease)   . Mitral valve regurgitation    mild  . Osteopenia   . Overweight (BMI 25.0-29.9) 08/11/2016  . Reflux     Surgical History: Past Surgical History:  Procedure Laterality Date  . CESAREAN SECTION  1987  . COLONOSCOPY WITH PROPOFOL N/A 12/05/2014   Procedure: COLONOSCOPY WITH PROPOFOL;  Surgeon: Lucilla Lame, MD;  Location: [facility] ENDOSCOPY;  Service: Endoscopy;  Laterality: N/A;    Home Medications:  Allergies as of 07/05/2017   No Known Allergies     Medication List        Accurate as of 07/05/17  7:57 PM. Always use your most recent med list.          acetaminophen 500 MG tablet Commonly known as:  TYLENOL Take 500 mg by mouth every 6 (six) hours as needed.   aspirin 81 MG tablet Take 81 mg by mouth daily.   Calcium Citrate-Vitamin D 200-250 MG-UNIT Tabs Take by mouth daily.   meloxicam 15 MG tablet Commonly known as:  MOBIC Take 1 tablet (15 mg total) by mouth daily.   metoprolol succinate 25 MG 24 hr tablet Commonly known as:  TOPROL-XL Take 1 tablet (25 mg total) by mouth daily.   multivitamin capsule Take 1 capsule by mouth daily.   OSTEO BI-FLEX REGULAR STRENGTH PO Take by mouth daily.   oxyCODONE-acetaminophen 5-325 MG tablet Commonly known  as:  PERCOCET/ROXICET Take 1 tablet by mouth every 6 (six) hours as needed for severe pain.   ranitidine 150 MG tablet Commonly known as:  ZANTAC Take 1 tablet (150 mg total) by mouth 2 (two) times daily.   tamsulosin 0.4 MG Caps capsule Commonly known as:  FLOMAX Take 1 capsule (0.4 mg total) by mouth daily.   vitamin C 500 MG tablet Commonly known as:  ASCORBIC ACID Take 500 mg by mouth daily.       Allergies: No Known Allergies  Family History: Family History  Problem Relation Age of Onset  . Aneurysm Mother        brain  . Hypertension Mother   . Stroke Mother        possibly mini strokes  . Heart disease Father   . Heart attack Father   . Mental retardation Father   . Cancer Brother   . Diabetes Maternal Aunt   . Hypertension Sister   . Obesity Sister   . Mental illness Sister   . Cancer Maternal Grandmother   . Hypertension Sister   . Mental illness Sister   . Depression Sister   . Hypertension Sister   . Depression Sister   . COPD Neg Hx   . Breast cancer Neg Hx     Social History:  reports that she quit smoking  about 32 years ago. Her smoking use included cigarettes. She has a 5.00 pack-year smoking history. she has never used smokeless tobacco. She reports that she drinks alcohol. She reports that she does not use drugs.  ROS: UROLOGY Frequent Urination?: No Hard to postpone urination?: No Burning/pain with urination?: No Get up at night to urinate?: Yes Leakage of urine?: No Urine stream starts and stops?: No Trouble starting stream?: No Do you have to strain to urinate?: No Blood in urine?: No Urinary tract infection?: No Sexually transmitted disease?: No Injury to kidneys or bladder?: No Painful intercourse?: No Weak stream?: No Currently pregnant?: No Vaginal bleeding?: No Last menstrual period?: n  Gastrointestinal Nausea?: No Vomiting?: No Indigestion/heartburn?: No Diarrhea?: No Constipation?: No  Constitutional Fever:  No Night sweats?: No Weight loss?: No Fatigue?: No  Skin Skin rash/lesions?: No Itching?: No  Eyes Blurred vision?: No Double vision?: No  Ears/Nose/Throat Sore throat?: No Sinus problems?: No  Hematologic/Lymphatic Swollen glands?: No Easy bruising?: No  Cardiovascular Leg swelling?: No Chest pain?: No  Respiratory Cough?: No Shortness of breath?: No  Endocrine Excessive thirst?: No  Musculoskeletal Back pain?: No Joint pain?: No  Neurological Headaches?: No Dizziness?: No  Psychologic Depression?: No Anxiety?: No  Physical Exam: BP (!) 171/91 (BP Location: Right Arm, Patient Position: Sitting, Cuff Size: Large)   Pulse 73   Ht 5\' 3"  (1.6 m)   Wt 153 lb 9.6 oz (69.7 kg)   BMI 27.21 kg/m   Constitutional:  Alert and oriented, No acute distress. HEENT:  AT, moist mucus membranes.  Trachea midline, no masses. Cardiovascular: No clubbing, cyanosis, or edema.  CV RRR Respiratory: Normal respiratory effort, no increased work of breathing.  Lungs clear GI: Abdomen is soft, nontender, nondistended, no abdominal masses GU: No CVA tenderness.  Skin: No rashes, bruises or suspicious lesions. Lymph: No cervical or inguinal adenopathy. Neurologic: Grossly intact, no focal deficits, moving all 4 extremities. Psychiatric: Normal mood and affect.  Laboratory Data: Lab Results  Component Value Date   WBC 9.6 06/15/2017   HGB 14.7 06/15/2017   HCT 42.3 06/15/2017   MCV 87.2 06/15/2017   PLT 364 06/15/2017    Lab Results  Component Value Date   CREATININE 0.83 06/15/2017    Pertinent Imaging:  Results for orders placed during the hospital encounter of 06/15/17  CT RENAL STONE STUDY   Narrative CLINICAL DATA:  Severe left flank pain for 2 days with nausea. Microscopic hematuria. History irritable bowel syndrome and Cesarean section.  EXAM: CT ABDOMEN AND PELVIS WITHOUT CONTRAST  TECHNIQUE: Multidetector CT imaging of the abdomen and pelvis was  performed following the standard protocol without IV contrast.  COMPARISON:  None.  FINDINGS: Lower chest: Non calcified, well-circumscribed 4 mm left lower lobe pulmonary nodule on image 6. There is an additional tiny nodule on image 8. The lung bases are otherwise clear. No significant pleural or pericardial effusion.  Hepatobiliary: Tiny low-density lesion in the dome of the left hepatic lobe on image number 10, likely benign. The liver otherwise appears unremarkable as imaged in the noncontrast state. No evidence of gallstones, gallbladder wall thickening or biliary dilatation.  Pancreas: Unremarkable. No pancreatic ductal dilatation or surrounding inflammatory changes.  Spleen: Scattered small calcified granulomas. Otherwise unremarkable.  Adrenals/Urinary Tract: Both adrenal glands appear normal. There are 3 nonobstructing calculi in the lower pole of the left kidney, measuring up to 5 mm in diameter. There is left-sided hydronephrosis and hydroureter secondary to an obstructing 2 mm calculus at  the left ureterovesical junction (image 77). There is mild perinephric soft tissue stranding on the left. No right-sided urinary tract calculi. In the interpolar region of the right kidney, there is a tiny fat density lesion consistent with an incidental angiomyolipoma, best seen on coronal image 57. The bladder is nearly empty but demonstrates no abnormality.  Stomach/Bowel: No evidence of bowel wall thickening, distention or surrounding inflammatory change. The appendix appears normal.  Vascular/Lymphatic: There are no enlarged abdominal or pelvic lymph nodes. Minimal aortoiliac atherosclerosis.  Reproductive: The uterus and adnexa appear unremarkable.  Other: No evidence of abdominal wall mass or hernia. No ascites.  Musculoskeletal: No acute or significant osseous findings. There are degenerative changes within the spine which are most advanced  at L1-2.  IMPRESSION: 1. Obstructing 2 mm calculus at the left ureterovesical junction. Not clearly visible on scout image. Associated hydronephrosis and mild perinephric soft tissue stranding. 2. Nonobstructing left renal calculi. 3. Tiny nodules at the left lung base. This appearance is almost certainly benign, and no dedicated follow-up is required if this patient is low risk for bronchogenic carcinoma (and has no known or suspected primary neoplasm). Non-contrast chest CT can be considered in 12 months if patient is high-risk. This recommendation follows the consensus statement: Guidelines for Management of Incidental Pulmonary Nodules Detected on CT Images: From the Fleischner Society 2017; Radiology 2017; 284:228-243.   Electronically Signed   By: Richardean Sale M.D.   On: 06/15/2017 12:59     Assessment & Plan:  I discussed management options of her nonobstructing left renal calculi including observation, ureteroscopic removal and shockwave lithotripsy.  The pros and cons of each treatment were discussed in detail.  She is not comfortable with observation due to her recent episode of renal colic.  She has elected elective shockwave lithotripsy.  The indications and nature of the planned procedure were discussed as well as the potential benefits and expected outcome.  Alternatives were discussed as described above.  The most common complications and side effects were discussed as outlined in the [facility] consent form.  It was stressed that there is no guarantee that lithotripsy will be successful and she could require retreatment or alternative treatment.  The rare instance of perirenal bleeding requiring hospitalization, transfusion and rarely surgery were discussed.  The possibility of renal colic from obstructing stone fragments requiring stent placement or ureteroscopy was also discussed.     Abbie Sons, [place] [address] [place], [place] [address] [phone]

## 2017-07-05 NOTE — Progress Notes (Signed)
07/05/2017 7:57 PM   Arsenio Katz 1956/04/09 [id number]  Referring provider: Arnetha Courser, MD [address] [place], [place] [address]  Chief Complaint  Patient presents with  . Follow-up    HPI: 62 year old female presents for follow-up.  She was seen on 06/16/2017 after an episode of left renal colic secondary to a 2 mm left UVJ calculus.  She also had 4 and 5 mm nonobstructing left renal calculi.  She states she passed the 2 mm stone later that evening.  She desires prophylactic treatment of her nonobstructing renal calculi as she had significant colic with a 2 mm stone.  She is presently asymptomatic.   PMH: Past Medical History:  Diagnosis Date  . GERD (gastroesophageal reflux disease)   . Mitral valve regurgitation    mild  . Osteopenia   . Overweight (BMI 25.0-29.9) 08/11/2016  . Reflux     Surgical History: Past Surgical History:  Procedure Laterality Date  . CESAREAN SECTION  1987  . COLONOSCOPY WITH PROPOFOL N/A 12/05/2014   Procedure: COLONOSCOPY WITH PROPOFOL;  Surgeon: Lucilla Lame, MD;  Location: [facility] ENDOSCOPY;  Service: Endoscopy;  Laterality: N/A;    Home Medications:  Allergies as of 07/05/2017   No Known Allergies     Medication List        Accurate as of 07/05/17  7:57 PM. Always use your most recent med list.          acetaminophen 500 MG tablet Commonly known as:  TYLENOL Take 500 mg by mouth every 6 (six) hours as needed.   aspirin 81 MG tablet Take 81 mg by mouth daily.   Calcium Citrate-Vitamin D 200-250 MG-UNIT Tabs Take by mouth daily.   meloxicam 15 MG tablet Commonly known as:  MOBIC Take 1 tablet (15 mg total) by mouth daily.   metoprolol succinate 25 MG 24 hr tablet Commonly known as:  TOPROL-XL Take 1 tablet (25 mg total) by mouth daily.   multivitamin capsule Take 1 capsule by mouth daily.   OSTEO BI-FLEX REGULAR STRENGTH PO Take by mouth daily.   oxyCODONE-acetaminophen 5-325 MG tablet Commonly known  as:  PERCOCET/ROXICET Take 1 tablet by mouth every 6 (six) hours as needed for severe pain.   ranitidine 150 MG tablet Commonly known as:  ZANTAC Take 1 tablet (150 mg total) by mouth 2 (two) times daily.   tamsulosin 0.4 MG Caps capsule Commonly known as:  FLOMAX Take 1 capsule (0.4 mg total) by mouth daily.   vitamin C 500 MG tablet Commonly known as:  ASCORBIC ACID Take 500 mg by mouth daily.       Allergies: No Known Allergies  Family History: Family History  Problem Relation Age of Onset  . Aneurysm Mother        brain  . Hypertension Mother   . Stroke Mother        possibly mini strokes  . Heart disease Father   . Heart attack Father   . Mental retardation Father   . Cancer Brother   . Diabetes Maternal Aunt   . Hypertension Sister   . Obesity Sister   . Mental illness Sister   . Cancer Maternal Grandmother   . Hypertension Sister   . Mental illness Sister   . Depression Sister   . Hypertension Sister   . Depression Sister   . COPD Neg Hx   . Breast cancer Neg Hx     Social History:  reports that she quit smoking  about 32 years ago. Her smoking use included cigarettes. She has a 5.00 pack-year smoking history. she has never used smokeless tobacco. She reports that she drinks alcohol. She reports that she does not use drugs.  ROS: UROLOGY Frequent Urination?: No Hard to postpone urination?: No Burning/pain with urination?: No Get up at night to urinate?: Yes Leakage of urine?: No Urine stream starts and stops?: No Trouble starting stream?: No Do you have to strain to urinate?: No Blood in urine?: No Urinary tract infection?: No Sexually transmitted disease?: No Injury to kidneys or bladder?: No Painful intercourse?: No Weak stream?: No Currently pregnant?: No Vaginal bleeding?: No Last menstrual period?: n  Gastrointestinal Nausea?: No Vomiting?: No Indigestion/heartburn?: No Diarrhea?: No Constipation?: No  Constitutional Fever:  No Night sweats?: No Weight loss?: No Fatigue?: No  Skin Skin rash/lesions?: No Itching?: No  Eyes Blurred vision?: No Double vision?: No  Ears/Nose/Throat Sore throat?: No Sinus problems?: No  Hematologic/Lymphatic Swollen glands?: No Easy bruising?: No  Cardiovascular Leg swelling?: No Chest pain?: No  Respiratory Cough?: No Shortness of breath?: No  Endocrine Excessive thirst?: No  Musculoskeletal Back pain?: No Joint pain?: No  Neurological Headaches?: No Dizziness?: No  Psychologic Depression?: No Anxiety?: No  Physical Exam: BP (!) 171/91 (BP Location: Right Arm, Patient Position: Sitting, Cuff Size: Large)   Pulse 73   Ht 5\' 3"  (1.6 m)   Wt 153 lb 9.6 oz (69.7 kg)   BMI 27.21 kg/m   Constitutional:  Alert and oriented, No acute distress. HEENT: [place] AT, moist mucus membranes.  Trachea midline, no masses. Cardiovascular: No clubbing, cyanosis, or edema.  CV RRR Respiratory: Normal respiratory effort, no increased work of breathing.  Lungs clear GI: Abdomen is soft, nontender, nondistended, no abdominal masses GU: No CVA tenderness.  Skin: No rashes, bruises or suspicious lesions. Lymph: No cervical or inguinal adenopathy. Neurologic: Grossly intact, no focal deficits, moving all 4 extremities. Psychiatric: Normal mood and affect.  Laboratory Data: Lab Results  Component Value Date   WBC 9.6 06/15/2017   HGB 14.7 06/15/2017   HCT 42.3 06/15/2017   MCV 87.2 06/15/2017   PLT 364 06/15/2017    Lab Results  Component Value Date   CREATININE 0.83 06/15/2017    Pertinent Imaging:  Results for orders placed during the hospital encounter of 06/15/17  CT RENAL STONE STUDY   Narrative CLINICAL DATA:  Severe left flank pain for 2 days with nausea. Microscopic hematuria. History irritable bowel syndrome and Cesarean section.  EXAM: CT ABDOMEN AND PELVIS WITHOUT CONTRAST  TECHNIQUE: Multidetector CT imaging of the abdomen and pelvis was  performed following the standard protocol without IV contrast.  COMPARISON:  None.  FINDINGS: Lower chest: Non calcified, well-circumscribed 4 mm left lower lobe pulmonary nodule on image 6. There is an additional tiny nodule on image 8. The lung bases are otherwise clear. No significant pleural or pericardial effusion.  Hepatobiliary: Tiny low-density lesion in the dome of the left hepatic lobe on image number 10, likely benign. The liver otherwise appears unremarkable as imaged in the noncontrast state. No evidence of gallstones, gallbladder wall thickening or biliary dilatation.  Pancreas: Unremarkable. No pancreatic ductal dilatation or surrounding inflammatory changes.  Spleen: Scattered small calcified granulomas. Otherwise unremarkable.  Adrenals/Urinary Tract: Both adrenal glands appear normal. There are 3 nonobstructing calculi in the lower pole of the left kidney, measuring up to 5 mm in diameter. There is left-sided hydronephrosis and hydroureter secondary to an obstructing 2 mm calculus at  the left ureterovesical junction (image 77). There is mild perinephric soft tissue stranding on the left. No right-sided urinary tract calculi. In the interpolar region of the right kidney, there is a tiny fat density lesion consistent with an incidental angiomyolipoma, best seen on coronal image 57. The bladder is nearly empty but demonstrates no abnormality.  Stomach/Bowel: No evidence of bowel wall thickening, distention or surrounding inflammatory change. The appendix appears normal.  Vascular/Lymphatic: There are no enlarged abdominal or pelvic lymph nodes. Minimal aortoiliac atherosclerosis.  Reproductive: The uterus and adnexa appear unremarkable.  Other: No evidence of abdominal wall mass or hernia. No ascites.  Musculoskeletal: No acute or significant osseous findings. There are degenerative changes within the spine which are most advanced  at L1-2.  IMPRESSION: 1. Obstructing 2 mm calculus at the left ureterovesical junction. Not clearly visible on scout image. Associated hydronephrosis and mild perinephric soft tissue stranding. 2. Nonobstructing left renal calculi. 3. Tiny nodules at the left lung base. This appearance is almost certainly benign, and no dedicated follow-up is required if this patient is low risk for bronchogenic carcinoma (and has no known or suspected primary neoplasm). Non-contrast chest CT can be considered in 12 months if patient is high-risk. This recommendation follows the consensus statement: Guidelines for Management of Incidental Pulmonary Nodules Detected on CT Images: From the Fleischner Society 2017; Radiology 2017; 284:228-243.   Electronically Signed   By: Richardean Sale M.D.   On: 06/15/2017 12:59     Assessment & Plan:  I discussed management options of her nonobstructing left renal calculi including observation, ureteroscopic removal and shockwave lithotripsy.  The pros and cons of each treatment were discussed in detail.  She is not comfortable with observation due to her recent episode of renal colic.  She has elected elective shockwave lithotripsy.  The indications and nature of the planned procedure were discussed as well as the potential benefits and expected outcome.  Alternatives were discussed as described above.  The most common complications and side effects were discussed as outlined in the [facility] consent form.  It was stressed that there is no guarantee that lithotripsy will be successful and she could require retreatment or alternative treatment.  The rare instance of perirenal bleeding requiring hospitalization, transfusion and rarely surgery were discussed.  The possibility of renal colic from obstructing stone fragments requiring stent placement or ureteroscopy was also discussed.     Abbie Sons, [facility] [address], [place], [place] [address] [phone]

## 2017-07-06 ENCOUNTER — Encounter: Payer: Self-pay | Admitting: Urology

## 2017-07-06 ENCOUNTER — Other Ambulatory Visit: Payer: Self-pay | Admitting: Radiology

## 2017-07-06 DIAGNOSIS — N200 Calculus of kidney: Secondary | ICD-10-CM

## 2017-07-08 ENCOUNTER — Other Ambulatory Visit: Payer: Self-pay | Admitting: Radiology

## 2017-07-08 DIAGNOSIS — N200 Calculus of kidney: Secondary | ICD-10-CM

## 2017-07-08 HISTORY — DX: Calculus of kidney: N20.0

## 2017-07-12 ENCOUNTER — Encounter: Payer: Self-pay | Admitting: *Deleted

## 2017-07-13 MED ORDER — CIPROFLOXACIN HCL 500 MG PO TABS
500.0000 mg | ORAL_TABLET | ORAL | Status: AC
  Administered 2017-07-14: 500 mg via ORAL

## 2017-07-14 ENCOUNTER — Ambulatory Visit: Payer: 59

## 2017-07-14 ENCOUNTER — Encounter
Admission: RE | Disposition: A | Discharge status: Home / Self Care | Payer: Self-pay | Source: Ambulatory Visit | Attending: Urology

## 2017-07-14 ENCOUNTER — Ambulatory Visit
Admission: RE | Admit: 2017-07-14 | Discharge: 2017-07-14 | Disposition: A | Discharge status: Home / Self Care | Payer: 59 | Source: Ambulatory Visit | Attending: Urology | Admitting: Urology

## 2017-07-14 DIAGNOSIS — K219 Gastro-esophageal reflux disease without esophagitis: Secondary | ICD-10-CM | POA: Diagnosis not present

## 2017-07-14 DIAGNOSIS — N200 Calculus of kidney: Secondary | ICD-10-CM

## 2017-07-14 DIAGNOSIS — Z87891 Personal history of nicotine dependence: Secondary | ICD-10-CM | POA: Insufficient documentation

## 2017-07-14 DIAGNOSIS — N132 Hydronephrosis with renal and ureteral calculous obstruction: Secondary | ICD-10-CM

## 2017-07-14 DIAGNOSIS — Z7982 Long term (current) use of aspirin: Secondary | ICD-10-CM | POA: Diagnosis not present

## 2017-07-14 DIAGNOSIS — K589 Irritable bowel syndrome without diarrhea: Secondary | ICD-10-CM | POA: Insufficient documentation

## 2017-07-14 DIAGNOSIS — Z79899 Other long term (current) drug therapy: Secondary | ICD-10-CM | POA: Diagnosis not present

## 2017-07-14 DIAGNOSIS — M8580 Other specified disorders of bone density and structure, unspecified site: Secondary | ICD-10-CM | POA: Insufficient documentation

## 2017-07-14 DIAGNOSIS — N202 Calculus of kidney with calculus of ureter: Secondary | ICD-10-CM | POA: Insufficient documentation

## 2017-07-14 HISTORY — DX: Calculus of kidney: N20.0

## 2017-07-14 HISTORY — DX: Irritable bowel syndrome without diarrhea: K58.9

## 2017-07-14 HISTORY — PX: EXTRACORPOREAL SHOCK WAVE LITHOTRIPSY: SHX1557

## 2017-07-14 SURGERY — LITHOTRIPSY, ESWL
Anesthesia: Moderate Sedation | Laterality: Left

## 2017-07-14 MED ORDER — SODIUM CHLORIDE 0.9 % IV SOLN: INTRAVENOUS | Status: DC

## 2017-07-14 MED ORDER — DIAZEPAM 5 MG PO TABS
ORAL_TABLET | ORAL | Status: AC
Start: 2017-07-14 — End: 2017-07-14
  Administered 2017-07-14: 10 mg via ORAL
  Filled 2017-07-14: qty 2

## 2017-07-14 MED ORDER — DIAZEPAM 5 MG PO TABS
10.0000 mg | ORAL_TABLET | ORAL | Status: AC
  Administered 2017-07-14: 10 mg via ORAL

## 2017-07-14 MED ORDER — DIPHENHYDRAMINE HCL 25 MG PO CAPS: 25.0000 mg | ORAL_CAPSULE | ORAL | Status: DC

## 2017-07-14 MED ORDER — CIPROFLOXACIN HCL 500 MG PO TABS
ORAL_TABLET | ORAL | Status: AC
  Administered 2017-07-14: 500 mg via ORAL
  Filled 2017-07-14: qty 1

## 2017-07-14 MED ORDER — TAMSULOSIN HCL 0.4 MG PO CAPS: 0.4000 mg | ORAL_CAPSULE | Freq: Every day | ORAL | 0 refills | Status: DC

## 2017-07-14 MED ORDER — OXYCODONE-ACETAMINOPHEN 5-325 MG PO TABS: 1.0000 | ORAL_TABLET | Freq: Four times a day (QID) | ORAL | 0 refills | Status: DC | PRN

## 2017-07-14 MED ORDER — ONDANSETRON HCL 4 MG/2ML IJ SOLN
INTRAMUSCULAR | Status: AC
  Filled 2017-07-14: qty 2

## 2017-07-14 MED ORDER — DIPHENHYDRAMINE HCL 25 MG PO CAPS
ORAL_CAPSULE | ORAL | Status: AC
  Filled 2017-07-14: qty 1

## 2017-07-14 MED ORDER — ONDANSETRON HCL 4 MG/2ML IJ SOLN: 4.0000 mg | Freq: Once | INTRAMUSCULAR | Status: DC

## 2017-07-14 NOTE — Interval H&P Note (Signed)
History and Physical Interval Note:  07/14/2017 7:42 AM  Alexandria Hill  has presented today for surgery, with the diagnosis of Kidney stone  The various methods of treatment have been discussed with the patient and family. After consideration of risks, benefits and other options for treatment, the patient has consented to  Procedure(s): EXTRACORPOREAL SHOCK WAVE LITHOTRIPSY (ESWL) (Left) as a surgical intervention .  The patient's history has been reviewed, patient examined, no change in status, stable for surgery.  I have reviewed the patient's chart and labs.  Questions were answered to the patient's satisfaction.     [place]

## 2017-07-14 NOTE — Discharge Instructions (Signed)

## 2017-07-26 NOTE — Progress Notes (Signed)
Closing out lab/order note open since:  2017

## 2017-07-28 ENCOUNTER — Ambulatory Visit: Payer: 59 | Admitting: Urology

## 2017-07-28 ENCOUNTER — Encounter: Payer: Self-pay | Admitting: Family Medicine

## 2017-07-28 ENCOUNTER — Telehealth: Payer: Self-pay | Admitting: Family Medicine

## 2017-07-28 NOTE — Telephone Encounter (Signed)
Copied from [place] [id number]. Topic: Quick Communication - Rx Refill/Question >> Jul 28, 2017  4:57 PM Oliver Pila B wrote: Medication: tamaflu  Relatives in her home has the flu, and pt was recommended to get the medication, contact pt to advise

## 2017-07-29 MED ORDER — OSELTAMIVIR PHOSPHATE 75 MG PO CAPS: 75.0000 mg | ORAL_CAPSULE | Freq: Every day | ORAL | 0 refills | Status: DC

## 2017-07-29 NOTE — Telephone Encounter (Signed)
Pt.notified

## 2017-07-29 NOTE — Telephone Encounter (Signed)
Please have her check MyChart I already sent the Tamiflu in Thank you

## 2017-08-01 ENCOUNTER — Ambulatory Visit: Payer: 59 | Admitting: Urology

## 2017-08-03 ENCOUNTER — Ambulatory Visit (INDEPENDENT_AMBULATORY_CARE_PROVIDER_SITE_OTHER): Payer: 59 | Admitting: Urology

## 2017-08-03 ENCOUNTER — Ambulatory Visit
Admission: RE | Admit: 2017-08-03 | Discharge: 2017-08-03 | Disposition: A | Discharge status: Home / Self Care | Payer: 59 | Source: Ambulatory Visit | Attending: Urology | Admitting: Urology

## 2017-08-03 ENCOUNTER — Encounter: Payer: Self-pay | Admitting: Urology

## 2017-08-03 VITALS — BP 151/87 | HR 91 | Resp 16 | Ht 63.5 in | Wt 155.0 lb

## 2017-08-03 DIAGNOSIS — N200 Calculus of kidney: Secondary | ICD-10-CM | POA: Diagnosis not present

## 2017-08-03 NOTE — Progress Notes (Signed)
08/03/2017 12:56 PM   Alexandria Hill 07/04/1955 [id number]  Referring provider: Arnetha Courser, MD [address] [place], [place] [address]  Chief Complaint  Patient presents with  . Follow-up    kidney stone- passed one stone     HPI: 62 year old female status post shockwave lithotripsy on 07/14/2017 for 2-3 mm left lower pole calculi.  Both stones were able to be treated.  She passed several small fragments.  She had no postoperative problems including flank pain, fever or nausea/vomiting.  KUB performed today was reviewed and the previously identified calcifications are no longer identified.  No obvious fragments are seen along the expected course of the ureter.   PMH: Past Medical History:  Diagnosis Date  . GERD (gastroesophageal reflux disease)   . IBS (irritable bowel syndrome) 2019  . Mitral valve regurgitation    mild  . Mitral valve regurgitation    mild  . Osteopenia   . Osteopenia   . Overweight (BMI 25.0-29.9) 08/11/2016  . Reflux   . Renal calculi 07/2017    Surgical History: Past Surgical History:  Procedure Laterality Date  . CESAREAN SECTION  1987  . COLONOSCOPY WITH PROPOFOL N/A 12/05/2014   Procedure: COLONOSCOPY WITH PROPOFOL;  Surgeon: Lucilla Lame, MD;  Location: [facility] ENDOSCOPY;  Service: Endoscopy;  Laterality: N/A;  . EXTRACORPOREAL SHOCK WAVE LITHOTRIPSY Left 07/14/2017   Procedure: EXTRACORPOREAL SHOCK WAVE LITHOTRIPSY (ESWL);  Surgeon: Abbie Sons, MD;  Location: [facility] ORS;  Service: Urology;  Laterality: Left;    Home Medications:  Allergies as of 08/03/2017   No Known Allergies     Medication List        Accurate as of 08/03/17 12:56 PM. Always use your most recent med list.          acetaminophen 500 MG tablet Commonly known as:  TYLENOL Take 500 mg by mouth every 6 (six) hours as needed.   aspirin 81 MG tablet Take 81 mg by mouth daily.   Calcium Citrate-Vitamin D 200-250 MG-UNIT Tabs Take by mouth daily.     meloxicam 15 MG tablet Commonly known as:  MOBIC Take 1 tablet (15 mg total) by mouth daily.   metoprolol succinate 25 MG 24 hr tablet Commonly known as:  TOPROL-XL Take 1 tablet (25 mg total) by mouth daily.   multivitamin capsule Take 1 capsule by mouth daily.   oseltamivir 75 MG capsule Commonly known as:  TAMIFLU Take 1 capsule (75 mg total) by mouth daily.   OSTEO BI-FLEX REGULAR STRENGTH PO Take by mouth daily.   vitamin C 500 MG tablet Commonly known as:  ASCORBIC ACID Take 500 mg by mouth daily.       Allergies: No Known Allergies  Family History: Family History  Problem Relation Age of Onset  . Aneurysm Mother        brain  . Hypertension Mother   . Stroke Mother        possibly mini strokes  . Heart disease Father   . Heart attack Father   . Mental retardation Father   . Cancer Brother   . Diabetes Maternal Aunt   . Hypertension Sister   . Obesity Sister   . Mental illness Sister   . Cancer Maternal Grandmother   . Hypertension Sister   . Mental illness Sister   . Depression Sister   . Hypertension Sister   . Depression Sister   . COPD Neg Hx   . Breast cancer Neg Hx  Social History:  reports that she quit smoking about 32 years ago. Her smoking use included cigarettes. She has a 5.00 pack-year smoking history. she has never used smokeless tobacco. She reports that she drinks alcohol. She reports that she does not use drugs.  ROS: UROLOGY Frequent Urination?: No Hard to postpone urination?: No Burning/pain with urination?: No Get up at night to urinate?: No Leakage of urine?: No Urine stream starts and stops?: No Trouble starting stream?: No Do you have to strain to urinate?: No Blood in urine?: No Urinary tract infection?: No Sexually transmitted disease?: No Injury to kidneys or bladder?: No Painful intercourse?: No Weak stream?: No Currently pregnant?: No Vaginal bleeding?: No Last menstrual period?:  n  Gastrointestinal Nausea?: No Vomiting?: No Indigestion/heartburn?: No Diarrhea?: No Constipation?: No  Constitutional Fever: No Night sweats?: No Weight loss?: No Fatigue?: No  Skin Skin rash/lesions?: No Itching?: No  Eyes Blurred vision?: No Double vision?: No  Ears/Nose/Throat Sore throat?: No Sinus problems?: No  Hematologic/Lymphatic Swollen glands?: No Easy bruising?: No  Cardiovascular Leg swelling?: No Chest pain?: No  Respiratory Cough?: No Shortness of breath?: No  Endocrine Excessive thirst?: No  Musculoskeletal Back pain?: No Joint pain?: No  Neurological Headaches?: No Dizziness?: No  Psychologic Depression?: No Anxiety?: No  Physical Exam: BP (!) 151/87   Pulse 91   Resp 16   Ht 5' 3.5" (1.613 m)   Wt 155 lb (70.3 kg)   SpO2 99%   BMI 27.03 kg/m   Constitutional:  Alert and oriented, No acute distress.  Laboratory Data: Lab Results  Component Value Date   WBC 9.6 06/15/2017   HGB 14.7 06/15/2017   HCT 42.3 06/15/2017   MCV 87.2 06/15/2017   PLT 364 06/15/2017    Lab Results  Component Value Date   CREATININE 0.83 06/15/2017     Pertinent Imaging: KUB reviewed    Assessment & Plan:   Good results status post shockwave lithotripsy.  Her stone fragments will be sent for analysis.  I recommended a metabolic evaluation through [facility] and she desires to proceed.  She will be notified with her metabolic evaluation results.  Follow-up 6 months with a KUB.   Return in about 6 months (around 01/31/2018) for KUB.  Abbie Sons, [facility] [address], [place], [place] [address] [phone]

## 2017-08-12 ENCOUNTER — Other Ambulatory Visit: Payer: Self-pay | Admitting: Urology

## 2017-08-15 ENCOUNTER — Ambulatory Visit: Payer: Self-pay | Admitting: *Deleted

## 2017-08-15 NOTE — Telephone Encounter (Signed)
Called in c/o right lower abdominal pain that started over the weekend.    She has a cold that started Friday other than that she denies any other abd symptoms.  The pain is a constant mild pain.  Nothing seems to make it better or worse.     I made an appt with Raelyn Ensign, FNP-C at 9:20am on 08/16/17.   Reason for Disposition . Age > 60 years  Answer Assessment - Initial Assessment Questions 1. LOCATION: "Where does it hurt?"      Right lower abdominal pain. 2. RADIATION: "Does the pain shoot anywhere else?" (e.g., chest, back)     No 3. ONSET: "When did the pain begin?" (e.g., minutes, hours or days ago)      Began over the weekend. 4. SUDDEN: "Gradual or sudden onset?"     Gradual onset.  5. PATTERN "Does the pain come and go, or is it constant?"    - If constant: "Is it getting better, staying the same, or worsening?"      (Note: Constant means the pain never goes away completely; most serious pain is constant and it progresses)     - If intermittent: "How long does it last?" "Do you have pain now?"     (Note: Intermittent means the pain goes away completely between bouts)     Staying about the same.  Constant 6. SEVERITY: "How bad is the pain?"  (e.g., Scale 1-10; mild, moderate, or severe)   - MILD (1-3): doesn't interfere with normal activities, abdomen soft and not tender to touch    - MODERATE (4-7): interferes with normal activities or awakens from sleep, tender to touch    - SEVERE (8-10): excruciating pain, doubled over, unable to do any normal activities      3-4 on pain scale.   I'm working so it's not that severe. 7. RECURRENT SYMPTOM: "Have you ever had this type of abdominal pain before?" If so, ask: "When was the last time?" and "What happened that time?"      No.    I have lower back pain.    I still have appendix, ovaries and uterus. 8. CAUSE: "What do you think is causing the abdominal pain?"     I don't know.     I had a cold and chills since Friday.   It was full  blown on Saturday.   No N/V/D. 9. RELIEVING/AGGRAVATING FACTORS: "What makes it better or worse?" (e.g., movement, antacids, bowel movement)    Nothing makes it better or worse. 10. OTHER SYMPTOMS: "Has there been any vomiting, diarrhea, constipation, or urine problems?"       No bladder or bowel symptoms. 11. PREGNANCY: "Is there any chance you are pregnant?" "When was your last menstrual period?"       Not asked  Protocols used: ABDOMINAL PAIN - [facility]

## 2017-08-16 ENCOUNTER — Ambulatory Visit: Payer: 59 | Admitting: Family Medicine

## 2017-08-16 ENCOUNTER — Encounter: Payer: Self-pay | Admitting: Family Medicine

## 2017-08-16 VITALS — BP 132/72 | HR 88 | Temp 98.2°F | Resp 16 | Ht 64.0 in | Wt 156.1 lb

## 2017-08-16 DIAGNOSIS — N3001 Acute cystitis with hematuria: Secondary | ICD-10-CM

## 2017-08-16 DIAGNOSIS — M545 Low back pain, unspecified: Secondary | ICD-10-CM

## 2017-08-16 DIAGNOSIS — K5900 Constipation, unspecified: Secondary | ICD-10-CM

## 2017-08-16 DIAGNOSIS — R1031 Right lower quadrant pain: Secondary | ICD-10-CM | POA: Diagnosis not present

## 2017-08-16 DIAGNOSIS — J069 Acute upper respiratory infection, unspecified: Secondary | ICD-10-CM | POA: Diagnosis not present

## 2017-08-16 DIAGNOSIS — R1032 Left lower quadrant pain: Secondary | ICD-10-CM

## 2017-08-16 LAB — POCT URINALYSIS DIPSTICK
Bilirubin, UA: NEGATIVE
GLUCOSE UA: NEGATIVE
Ketones, UA: NEGATIVE
Nitrite, UA: NEGATIVE
Spec Grav, UA: <=1.005 — AB (ref 1.010–1.025)
Urobilinogen, UA: NEGATIVE E.U./dL — AB
pH, UA: 6 (ref 5.0–8.0)

## 2017-08-16 MED ORDER — SULFAMETHOXAZOLE-TRIMETHOPRIM 800-160 MG PO TABS: 1.0000 | ORAL_TABLET | Freq: Two times a day (BID) | ORAL | 0 refills | Status: AC

## 2017-08-16 NOTE — Patient Instructions (Addendum)
Miralax twice a day until bowel movements return to their usual frequency, then take once daily for about a week. Upper Respiratory Infection, Adult Most upper respiratory infections (URIs) are caused by a virus. A URI affects the nose, throat, and upper air passages. The most common type of URI is often called "the common cold." Follow these instructions at home:  Take medicines only as told by your doctor.  Gargle warm saltwater or take cough drops to comfort your throat as told by your doctor.  Use a warm mist humidifier or inhale steam from a shower to increase air moisture. This may make it easier to breathe.  Drink enough fluid to keep your pee (urine) clear or pale yellow.  Eat soups and other clear broths.  Have a healthy diet.  Rest as needed.  Go back to work when your fever is gone or your doctor says it is okay. ? You may need to stay home longer to avoid giving your URI to others. ? You can also wear a face mask and wash your hands often to prevent spread of the virus.  Use your inhaler more if you have asthma.  Do not use any tobacco products, including cigarettes, chewing tobacco, or electronic cigarettes. If you need help quitting, ask your doctor. Contact a doctor if:  You are getting worse, not better.  Your symptoms are not helped by medicine.  You have chills.  You are getting more short of breath.  You have brown or red mucus.  You have yellow or brown discharge from your nose.  You have pain in your face, especially when you bend forward.  You have a fever.  You have puffy (swollen) neck glands.  You have pain while swallowing.  You have white areas in the back of your throat. Get help right away if:  You have very bad or constant: ? Headache. ? Ear pain. ? Pain in your forehead, behind your eyes, and over your cheekbones (sinus pain). ? Chest pain.  You have long-lasting (chronic) lung disease and any of the  following: ? Wheezing. ? Long-lasting cough. ? Coughing up blood. ? A change in your usual mucus.  You have a stiff neck.  You have changes in your: ? Vision. ? Hearing. ? Thinking. ? Mood. This information is not intended to replace advice given to you by your health care provider. Make sure you discuss any questions you have with your health care provider. Document Released: 11/10/2007 Document Revised: 01/25/2016 Document Reviewed: 08/29/2013 Elsevier Interactive Patient Education  2018 [employer].  Urinary Tract Infection, Adult A urinary tract infection (UTI) is an infection of any part of the urinary tract. The urinary tract includes the:  Kidneys.  Ureters.  Bladder.  Urethra.  These organs make, store, and get rid of pee (urine) in the body. Follow these instructions at home:  Take over-the-counter and prescription medicines only as told by your doctor.  If you were prescribed an antibiotic medicine, take it as told by your doctor. Do not stop taking the antibiotic even if you start to feel better.  Avoid the following drinks: ? Alcohol. ? Caffeine. ? Tea. ? Carbonated drinks.  Drink enough fluid to keep your pee clear or pale yellow.  Keep all follow-up visits as told by your doctor. This is important.  Make sure to: ? Empty your bladder often and completely. Do not to hold pee for long periods of time. ? Empty your bladder before and after sex. ?  Wipe from front to back after a bowel movement if you are female. Use each tissue one time when you wipe. Contact a doctor if:  You have back pain.  You have a fever.  You feel sick to your stomach (nauseous).  You throw up (vomit).  Your symptoms do not get better after 3 days.  Your symptoms go away and then come back. Get help right away if:  You have very bad back pain.  You have very bad lower belly (abdominal) pain.  You are throwing up and cannot keep down any medicines or water. This  information is not intended to replace advice given to you by your health care provider. Make sure you discuss any questions you have with your health care provider. Document Released: 11/10/2007 Document Revised: 10/30/2015 Document Reviewed: 04/14/2015 Elsevier Interactive Patient Education  [employer].

## 2017-08-16 NOTE — Progress Notes (Signed)
Name: Alexandria Hill   MRN: [id number]    DOB: 1956-06-05   Date:08/16/2017       Progress Note  Subjective  Chief Complaint  Chief Complaint  Patient presents with  . Abdominal Pain    cold chills. Recently had kidney stone procedure on February 7th  . Back Pain    lower back     HPI  Pt presents with concern for abdominal pain and URI symptoms:  Started out as cold-like symptoms - scratchy throat and rhinorrhea about 4 days ago. 3 days ago she developed some chills and dull intermittent RLQ pain accompanied by right low back pain.  Yesterday she experienced some LLQ and RLQ abdominal pain both sides were dull and intermittent with some chills. Today she is having mild pain in RIGHT lower back pain that she describes as sore; she did have recent lithotripsy for nephrolithiasis, recent Xray on 08/03/2017 by Dr. Bernardo Heater did not find any RIGHT sided stones.  Endorses mild constipation for the last couple of days.  Denies dysuria, urgency/freuqency, hematuria, fevers, cough, ear pain/pressure, sinus pain/pressure, NVD.   Patient Active Problem List   Diagnosis Date Noted  . Chest pain 08/11/2016  . Essential hypertension, benign 08/11/2016  . Overweight (BMI 25.0-29.9) 08/11/2016  . Heartburn 08/11/2016  . Breast cancer screening 04/24/2015  . Preventative health care 04/24/2015  . Varicella exposure 04/24/2015  . Osteopenia     Social History   Tobacco Use  . Smoking status: Former Smoker    Packs/day: 0.50    Years: 10.00    Pack years: 5.00    Types: Cigarettes    Last attempt to quit: 04/23/1985    Years since quitting: 32.3  . Smokeless tobacco: Never Used  Substance Use Topics  . Alcohol use: Yes    Comment: occasional     Current Outpatient Medications:  .  acetaminophen (TYLENOL) 500 MG tablet, Take 500 mg by mouth every 6 (six) hours as needed., Disp: , Rfl:  .  aspirin 81 MG tablet, Take 81 mg by mouth daily., Disp: , Rfl:  .  Calcium Citrate-Vitamin D 200-250  MG-UNIT TABS, Take by mouth daily., Disp: , Rfl:  .  Glucosamine-Chondroitin (OSTEO BI-FLEX REGULAR STRENGTH PO), Take by mouth daily. , Disp: , Rfl:  .  metoprolol succinate (TOPROL-XL) 25 MG 24 hr tablet, Take 1 tablet (25 mg total) by mouth daily., Disp: 90 tablet, Rfl: 3 .  Multiple Vitamin (MULTIVITAMIN) capsule, Take 1 capsule by mouth daily., Disp: , Rfl:  .  vitamin C (ASCORBIC ACID) 500 MG tablet, Take 500 mg by mouth daily., Disp: , Rfl:  .  sulfamethoxazole-trimethoprim (BACTRIM DS,SEPTRA DS) 800-160 MG tablet, Take 1 tablet by mouth 2 (two) times daily for 3 days., Disp: 6 tablet, Rfl: 0  No Known Allergies  ROS  Constitutional: Negative for fever or weight change.  Respiratory: Negative for cough and shortness of breath.   Cardiovascular: Negative for chest pain or palpitations.  Gastrointestinal: See HPI Musculoskeletal: Negative for gait problem or joint swelling.  Skin: Negative for rash.  Neurological: Negative for dizziness or headache.  No other specific complaints in a complete review of systems (except as listed in HPI above).  Objective  Vitals:   08/16/17 0925  BP: 132/72  Pulse: 88  Resp: 16  Temp: 98.2 F (36.8 C)  SpO2: 96%  Weight: 156 lb 1.6 oz (70.8 kg)  Height: 5\' 4"  (1.626 m)   Body mass index is 26.79 kg/m.  Nursing  Note and Vital Signs reviewed.  Physical Exam  Constitutional: Patient appears well-developed and well-nourished. Obese. No distress.  HEENT: head atraumatic, normocephalic, pupils equal and reactive to light, EOM's intact, TM's without erythema or bulging, no maxillary or frontal sinus tenderness , neck supple without lymphadenopathy, oropharynx pink and moist without exudate. Voice is slightly hoarse during examination. Cardiovascular: Normal rate, regular rhythm, S1/S2 present.  No murmur or rub heard. No BLE edema. Pulmonary/Chest: Effort normal and breath sounds clear. No respiratory distress or retractions. Abdominal: Soft  and non-tender, bowel sounds present x4 quadrants.  No CVA Tenderness. Psychiatric: Patient has a normal mood and affect. behavior is normal. Judgment and thought content normal. Musculoskeletal: Normal range of motion, no joint effusions. No gross deformities; tenderness over the RIGHT low back musculature on palpation. Neurological: she is alert and oriented to person, place, and time. No cranial nerve deficit. Coordination, balance, strength, speech and gait are normal.  Skin: Skin is warm and dry. No rash noted. No erythema.   Results for orders placed or performed in visit on 08/16/17 (from the past 72 hour(s))  POCT urinalysis dipstick     Status: Abnormal   Collection Time: 08/16/17  9:58 AM  Result Value Ref Range   Color, UA yellow    Clarity, UA clear    Glucose, UA negative    Bilirubin, UA negative    Ketones, UA negative    Spec Grav, UA <=1.005 (A) 1.010 - 1.025   Blood, UA large    pH, UA 6.0 5.0 - 8.0   Protein, UA trace    Urobilinogen, UA negative (A) 0.2 or 1.0 E.U./dL   Nitrite, UA negative    Leukocytes, UA Large (3+) (A) Negative   Appearance clear    Odor none     Assessment & Plan  1. Acute cystitis with hematuria - Urine Culture - sulfamethoxazole-trimethoprim (BACTRIM DS,SEPTRA DS) 800-160 MG tablet; Take 1 tablet by mouth 2 (two) times daily for 3 days.  Dispense: 6 tablet; Refill: 0  2. Abdominal pain, bilateral lower quadrant - POCT urinalysis dipstick  3. Constipation, unspecified constipation type - Miralax BID until BM's regulate, then once daily.  4. Acute right-sided low back pain without sciatica - POCT urinalysis dipstick  5. Upper respiratory tract infection, unspecified type - Discussed symptomatic care, she declines tessalon or flonase today, will take OTC medication.  - Declines KUB today, would like to do labs first.   - She has miralax at home and will take BID until BM's become regular and then will go to once daily. - Return  for 3 week Nurse visit for UA (Hematuria check). -Red flags and when to present for emergency care or RTC including fever >101.42F, chest pain, shortness of breath, new/worsening/un-resolving symptoms, severe pain, Nausea or vomiting, hematuria reviewed with patient at time of visit. Follow up and care instructions discussed and provided in AVS.

## 2017-08-17 LAB — URINE CULTURE
MICRO NUMBER:: 90313071
SPECIMEN QUALITY:: ADEQUATE

## 2017-08-18 ENCOUNTER — Telehealth: Payer: Self-pay

## 2017-08-18 NOTE — Telephone Encounter (Signed)
-----   Message from Abbie Sons, MD sent at 08/17/2017  7:40 AM EDT ----- Joaquim Lai analysis was mixed calcium oxalate.  Proceed with metabolic evaluation as we previously discussed.

## 2017-08-18 NOTE — Telephone Encounter (Signed)
Letter sent.

## 2017-09-05 ENCOUNTER — Ambulatory Visit (INDEPENDENT_AMBULATORY_CARE_PROVIDER_SITE_OTHER): Payer: 59

## 2017-09-05 ENCOUNTER — Other Ambulatory Visit: Payer: 59

## 2017-09-05 DIAGNOSIS — N3000 Acute cystitis without hematuria: Secondary | ICD-10-CM

## 2017-09-05 DIAGNOSIS — N200 Calculus of kidney: Secondary | ICD-10-CM | POA: Diagnosis not present

## 2017-09-05 LAB — POCT URINALYSIS DIPSTICK
BILIRUBIN UA: NEGATIVE
GLUCOSE UA: NEGATIVE
KETONES UA: NEGATIVE
Leukocytes, UA: NEGATIVE
Nitrite, UA: NEGATIVE
PH UA: 7 (ref 5.0–8.0)
Protein, UA: NEGATIVE
RBC UA: NEGATIVE
SPEC GRAV UA: 1.02 (ref 1.010–1.025)
Urobilinogen, UA: NEGATIVE E.U./dL — AB

## 2017-10-06 DIAGNOSIS — D485 Neoplasm of uncertain behavior of skin: Secondary | ICD-10-CM | POA: Diagnosis not present

## 2017-10-06 DIAGNOSIS — Z85828 Personal history of other malignant neoplasm of skin: Secondary | ICD-10-CM | POA: Diagnosis not present

## 2017-10-06 DIAGNOSIS — D2262 Melanocytic nevi of left upper limb, including shoulder: Secondary | ICD-10-CM | POA: Diagnosis not present

## 2017-10-06 DIAGNOSIS — D2261 Melanocytic nevi of right upper limb, including shoulder: Secondary | ICD-10-CM | POA: Diagnosis not present

## 2017-10-06 DIAGNOSIS — C44319 Basal cell carcinoma of skin of other parts of face: Secondary | ICD-10-CM | POA: Diagnosis not present

## 2017-10-06 DIAGNOSIS — C44519 Basal cell carcinoma of skin of other part of trunk: Secondary | ICD-10-CM | POA: Diagnosis not present

## 2017-11-10 DIAGNOSIS — L578 Other skin changes due to chronic exposure to nonionizing radiation: Secondary | ICD-10-CM | POA: Diagnosis not present

## 2017-11-10 DIAGNOSIS — C44319 Basal cell carcinoma of skin of other parts of face: Secondary | ICD-10-CM | POA: Diagnosis not present

## 2017-11-10 DIAGNOSIS — L988 Other specified disorders of the skin and subcutaneous tissue: Secondary | ICD-10-CM | POA: Diagnosis not present

## 2017-11-13 ENCOUNTER — Other Ambulatory Visit: Payer: Self-pay | Admitting: Family Medicine

## 2017-11-14 ENCOUNTER — Ambulatory Visit: Payer: 59 | Admitting: Nurse Practitioner

## 2017-11-14 ENCOUNTER — Encounter: Payer: Self-pay | Admitting: Nurse Practitioner

## 2017-11-14 VITALS — BP 130/76 | HR 76 | Temp 98.3°F | Resp 16 | Ht 64.0 in | Wt 155.0 lb

## 2017-11-14 DIAGNOSIS — Z87442 Personal history of urinary calculi: Secondary | ICD-10-CM | POA: Diagnosis not present

## 2017-11-14 DIAGNOSIS — M5431 Sciatica, right side: Secondary | ICD-10-CM | POA: Diagnosis not present

## 2017-11-14 DIAGNOSIS — K589 Irritable bowel syndrome without diarrhea: Secondary | ICD-10-CM

## 2017-11-14 DIAGNOSIS — R1031 Right lower quadrant pain: Secondary | ICD-10-CM

## 2017-11-14 NOTE — Progress Notes (Addendum)
Name: Alexandria Hill   MRN: [id number]    DOB: 1955/06/17   Date:11/14/2017       Progress Note  Subjective  Chief Complaint  Chief Complaint  Patient presents with  . Abdominal Pain    for 3 months  . Back Pain    HPI  Patient was seen for same by Raquel Sarna NP in office on 3/12 and Urology Dr. Bernardo Heater 2/27- did not find any stones, did find and treat UTI, treated constipation with miralax. Patient endorses right lower abdominal pain that radiates to back- states has been intermittent for the 3 months. Patient states pain comes 4 days out of a week, not severe pain just awareness of it. Sts is aggravated with moving at night sometimes. Rarely takes medicine but states has been relieved with ibuprofen in the past but doesn't like to take it. Patient denies nausea, vomiting, diarrhea, dysuria, vaginal discharge or bleeding, hematuria, urinary frequency. Patient endorses a few bowel movements usually 2 in the morning; if her IBS is flared up may have some episodes in the afternoon Bristol scale 4 to 5. When pain comes it is a constant achey- sore pain that lasts usually the whole day.   Patient Active Problem List   Diagnosis Date Noted  . IBS (irritable bowel syndrome) 11/14/2017  . Sciatic nerve pain, right 11/14/2017  . Personal history of kidney stones 11/14/2017  . Essential hypertension, benign 08/11/2016  . Overweight (BMI 25.0-29.9) 08/11/2016  . Heartburn 08/11/2016  . Varicella exposure 04/24/2015  . Osteopenia     Past Medical History:  Diagnosis Date  . GERD (gastroesophageal reflux disease)   . IBS (irritable bowel syndrome) 2019  . Mitral valve regurgitation    mild  . Mitral valve regurgitation    mild  . Osteopenia   . Osteopenia   . Overweight (BMI 25.0-29.9) 08/11/2016  . Reflux   . Renal calculi 07/2017    Past Surgical History:  Procedure Laterality Date  . CESAREAN SECTION  1987  . COLONOSCOPY WITH PROPOFOL N/A 12/05/2014   Procedure: COLONOSCOPY WITH  PROPOFOL;  Surgeon: Lucilla Lame, MD;  Location: [facility] ENDOSCOPY;  Service: Endoscopy;  Laterality: N/A;  . EXTRACORPOREAL SHOCK WAVE LITHOTRIPSY Left 07/14/2017   Procedure: EXTRACORPOREAL SHOCK WAVE LITHOTRIPSY (ESWL);  Surgeon: Abbie Sons, MD;  Location: [facility] ORS;  Service: Urology;  Laterality: Left;    Social History   Tobacco Use  . Smoking status: Former Smoker    Packs/day: 0.50    Years: 10.00    Pack years: 5.00    Types: Cigarettes    Last attempt to quit: 04/23/1985    Years since quitting: 32.5  . Smokeless tobacco: Never Used  Substance Use Topics  . Alcohol use: Yes    Comment: occasional     Current Outpatient Medications:  .  acetaminophen (TYLENOL) 500 MG tablet, Take 500 mg by mouth every 6 (six) hours as needed., Disp: , Rfl:  .  aspirin 81 MG tablet, Take 81 mg by mouth daily., Disp: , Rfl:  .  Calcium Citrate-Vitamin D 200-250 MG-UNIT TABS, Take by mouth daily., Disp: , Rfl:  .  Glucosamine-Chondroitin (OSTEO BI-FLEX REGULAR STRENGTH PO), Take by mouth daily. , Disp: , Rfl:  .  metoprolol succinate (TOPROL-XL) 25 MG 24 hr tablet, Take 1 tablet (25 mg total) by mouth daily., Disp: 90 tablet, Rfl: 3 .  Multiple Vitamin (MULTIVITAMIN) capsule, Take 1 capsule by mouth daily., Disp: , Rfl:  .  vitamin C (ASCORBIC ACID)  500 MG tablet, Take 500 mg by mouth daily., Disp: , Rfl:   No Known Allergies  ROS  Constitutional: Negative for fever or unexplained weight change.  Respiratory: Negative for cough and shortness of breath.   Cardiovascular: Negative for chest pain or palpitations.  Gastrointestinal: Positive for abdominal pain, no bowel changes.  Musculoskeletal: Negative for gait problem or joint swelling.  Skin: Negative for rash.  Neurological: Negative for dizziness or headache.  No other specific complaints in a complete review of systems (except as listed in HPI above).  Objective  Vitals:   11/14/17 0936  BP: 130/76  Pulse: 76  Resp: 16   Temp: 98.3 F (36.8 C)  TempSrc: Oral  SpO2: 96%  Weight: 155 lb (70.3 kg)  Height: 5\' 4"  (1.626 m)     Body mass index is 26.61 kg/m.  Nursing Note and Vital Signs reviewed.  Physical Exam   Constitutional: Patient appears well-developed and well-nourished. o distress.  Cardiovascular: Normal rate, regular rhythm, S1/S2 present.   Pulmonary/Chest: Effort normal and breath sounds clear.  Abdominal: Soft and non-tender, bowel sounds present throughout, no CVA tenderness, no inguinal lymphadenopathy, no rebound tenderness or masses palpated.  MSK: non tender back, full ROM, negative straight leg test  Psychiatric: Patient has a normal mood and affect. behavior is normal. Judgment and thought content normal.  No results found for this or any previous visit (from the past 72 hour(s)).  Assessment & Plan 1. RLQ abdominal pain -ongoing for 3 months mild achey pain intermittently, possible IBS or GI related, Lower suspicion for MSK, GYN or urology due to descriptors. Shared decision-making utilized- will proceed with urine and lab work, if negative work-up will discuss possible imaging and referral to GI. 2 week follow up with PCP  - Urinalysis, Routine w reflex microscopic - CBC - COMPLETE METABOLIC PANEL WITH GFR - Lipase  2. Irritable bowel syndrome without diarrhea - food diary, FODMAP,  3. Sciatic nerve pain, right - heat and stretching   4. Personal history of kidney stones - drink plenty of water, cont. Routine follow up with urology    Face-to-face time with patient was more than 25 minutes, >50% time spent counseling and coordination of care -Red flags and when to present for emergency care or RTC including fever >101.44F, chest pain, shortness of breath, new/worsening/un-resolving symptoms, vomiting, dizziness reviewed with patient at time of visit. Follow up and care instructions discussed and provided in AVS.  -------------------------------------------- I have  reviewed this encounter including the documentation in this note and/or discussed this patient with the provider, Suezanne Cheshire DNP AGNP-C. I am certifying that I agree with the content of this note as supervising physician. Enid Derry, [facility] 11/14/2017, 1:08 PM

## 2017-11-14 NOTE — Telephone Encounter (Signed)
Relation to pt: self Call back [phone] Pharmacy: [facility], [place] [phone] (Phone) [phone] (Fax)      Reason for call:  Patient states error on her part and would like a new Rx for metoprolol succinate (TOPROL-XL) 25 MG 24 hr tablet 1x daily please send to retail due to the year supply being cheaper.

## 2017-11-14 NOTE — Telephone Encounter (Signed)
Left detailed voicemail

## 2017-11-14 NOTE — Patient Instructions (Addendum)
- Keep a food diary and also note your symptoms - Drink plenty of water 64 ounces a day  - Get lab work done - If pain is unrelieved and labs are normal can do x-ray or referral to GI   Abdominal Pain, Adult Many things can cause belly (abdominal) pain. Most times, belly pain is not dangerous. Many cases of belly pain can be watched and treated at home. Sometimes belly pain is serious, though. Your doctor will try to find the cause of your belly pain. Follow these instructions at home:  Take over-the-counter and prescription medicines only as told by your doctor. Do not take medicines that help you poop (laxatives) unless told to by your doctor.  Drink enough fluid to keep your pee (urine) clear or pale yellow.  Watch your belly pain for any changes.  Keep all follow-up visits as told by your doctor. This is important. Contact a doctor if:  Your belly pain changes or gets worse.  You are not hungry, or you lose weight without trying.  You are having trouble pooping (constipated) or have watery poop (diarrhea) for more than 2-3 days.  You have pain when you pee or poop.  Your belly pain wakes you up at night.  Your pain gets worse with meals, after eating, or with certain foods.  You are throwing up and cannot keep anything down.  You have a fever. Get help right away if:  Your pain does not go away as soon as your doctor says it should.  You cannot stop throwing up.  Your pain is only in areas of your belly, such as the right side or the left lower part of the belly.  You have bloody or black poop, or poop that looks like tar.  You have very bad pain, cramping, or bloating in your belly.  You have signs of not having enough fluid or water in your body (dehydration), such as: ? Dark pee, very little pee, or no pee. ? Cracked lips. ? Dry mouth. ? Sunken eyes. ? Sleepiness. ? Weakness. This information is not intended to replace advice given to you by your health  care provider. Make sure you discuss any questions you have with your health care provider. Document Released: 11/10/2007 Document Revised: 12/12/2015 Document Reviewed: 11/05/2015 Elsevier Interactive Patient Education  2018 [employer].   Sciatica Rehab Ask your health care provider which exercises are safe for you. Do exercises exactly as told by your health care provider and adjust them as directed. It is normal to feel mild stretching, pulling, tightness, or discomfort as you do these exercises, but you should stop right away if you feel sudden pain or your pain gets worse.Do not begin these exercises until told by your health care provider. Stretching and range of motion exercises These exercises warm up your muscles and joints and improve the movement and flexibility of your hips and your back. These exercises also help to relieve pain, numbness, and tingling. Exercise A: Sciatic nerve glide 1. Sit in a chair with your head facing down toward your chest. Place your hands behind your back. Let your shoulders slump forward. 2. Slowly straighten one of your knees while you tilt your head back as if you are looking toward the ceiling. Only straighten your leg as far as you can without making your symptoms worse. 3. Hold for __________ seconds. 4. Slowly return to the starting position. 5. Repeat with your other leg. Repeat __________ times. Complete this exercise __________  times a day. Exercise B: Knee to chest with hip adduction and internal rotation  1. Lie on your back on a firm surface with both legs straight. 2. Bend one of your knees and move it up toward your chest until you feel a gentle stretch in your lower back and buttock. Then, move your knee toward the shoulder that is on the opposite side from your leg. ? Hold your leg in this position by holding onto the front of your knee. 3. Hold for __________ seconds. 4. Slowly return to the starting position. 5. Repeat with your  other leg. Repeat __________ times. Complete this exercise __________ times a day. Exercise C: Prone extension on elbows  1. Lie on your abdomen on a firm surface. A bed may be too soft for this exercise. 2. Prop yourself up on your elbows. 3. Use your arms to help lift your chest up until you feel a gentle stretch in your abdomen and your lower back. ? This will place some of your body weight on your elbows. If this is uncomfortable, try stacking pillows under your chest. ? Your hips should stay down, against the surface that you are lying on. Keep your hip and back muscles relaxed. 4. Hold for __________ seconds. 5. Slowly relax your upper body and return to the starting position. Repeat __________ times. Complete this exercise __________ times a day. Strengthening exercises These exercises build strength and endurance in your back. Endurance is the ability to use your muscles for a long time, even after they get tired. Exercise D: Pelvic tilt 1. Lie on your back on a firm surface. Bend your knees and keep your feet flat. 2. Tense your abdominal muscles. Tip your pelvis up toward the ceiling and flatten your lower back into the floor. ? To help with this exercise, you may place a small towel under your lower back and try to push your back into the towel. 3. Hold for __________ seconds. 4. Let your muscles relax completely before you repeat this exercise. Repeat __________ times. Complete this exercise __________ times a day. Exercise E: Alternating arm and leg raises  1. Get on your hands and knees on a firm surface. If you are on a hard floor, you may want to use padding to cushion your knees, such as an exercise mat. 2. Line up your arms and legs. Your hands should be below your shoulders, and your knees should be below your hips. 3. Lift your left leg behind you. At the same time, raise your right arm and straighten it in front of you. ? Do not lift your leg higher than your hip. ? Do  not lift your arm higher than your shoulder. ? Keep your abdominal and back muscles tight. ? Keep your hips facing the ground. ? Do not arch your back. ? Keep your balance carefully, and do not hold your breath. 4. Hold for __________ seconds. 5. Slowly return to the starting position and repeat with your right leg and your left arm. Repeat __________ times. Complete this exercise __________ times a day. Posture and body mechanics  Body mechanics refers to the movements and positions of your body while you do your daily activities. Posture is part of body mechanics. Good posture and healthy body mechanics can help to relieve stress in your body's tissues and joints. Good posture means that your spine is in its natural S-curve position (your spine is neutral), your shoulders are pulled back slightly, and your head is not  tipped forward. The following are general guidelines for applying improved posture and body mechanics to your everyday activities. Standing   When standing, keep your spine neutral and your feet about hip-width apart. Keep a slight bend in your knees. Your ears, shoulders, and hips should line up.  When you do a task in which you stand in one place for a long time, place one foot up on a stable object that is 2-4 inches (5-10 cm) high, such as a footstool. This helps keep your spine neutral. Sitting   When sitting, keep your spine neutral and keep your feet flat on the floor. Use a footrest, if necessary, and keep your thighs parallel to the floor. Avoid rounding your shoulders, and avoid tilting your head forward.  When working at a desk or a computer, keep your desk at a height where your hands are slightly lower than your elbows. Slide your chair under your desk so you are close enough to maintain good posture.  When working at a computer, place your monitor at a height where you are looking straight ahead and you do not have to tilt your head forward or downward to look at  the screen. Resting   When lying down and resting, avoid positions that are most painful for you.  If you have pain with activities such as sitting, bending, stooping, or squatting (flexion-based activities), lie in a position in which your body does not bend very much. For example, avoid curling up on your side with your arms and knees near your chest (fetal position).  If you have pain with activities such as standing for a long time or reaching with your arms (extension-based activities), lie with your spine in a neutral position and bend your knees slightly. Try the following positions: ? Lying on your side with a pillow between your knees. ? Lying on your back with a pillow under your knees. Lifting   When lifting objects, keep your feet at least shoulder-width apart and tighten your abdominal muscles.  Bend your knees and hips and keep your spine neutral. It is important to lift using the strength of your legs, not your back. Do not lock your knees straight out.  Always ask for help to lift heavy or awkward objects. This information is not intended to replace advice given to you by your health care provider. Make sure you discuss any questions you have with your health care provider. Document Released: 05/24/2005 Document Revised: 01/29/2016 Document Reviewed: 02/07/2015 Elsevier Interactive Patient Education  [employer].

## 2017-11-14 NOTE — Telephone Encounter (Signed)
Could you please clarify the instructions with patient for the metoprolol? I sent a year's worth to [facility] in November 2018 for one pill daily New request is for half a pill daily to another pharmacy Please clarify how much she is taking, what happened to Rx from November, and where does she want this one sent?

## 2017-11-14 NOTE — Telephone Encounter (Signed)
Rx approved

## 2017-11-15 ENCOUNTER — Other Ambulatory Visit: Payer: Self-pay | Admitting: Nurse Practitioner

## 2017-11-15 ENCOUNTER — Telehealth: Payer: Self-pay | Admitting: Family Medicine

## 2017-11-15 DIAGNOSIS — N3090 Cystitis, unspecified without hematuria: Secondary | ICD-10-CM

## 2017-11-15 LAB — URINALYSIS, ROUTINE W REFLEX MICROSCOPIC
BILIRUBIN URINE: NEGATIVE
Bacteria, UA: NONE SEEN /HPF
GLUCOSE, UA: NEGATIVE
HGB URINE DIPSTICK: NEGATIVE
HYALINE CAST: NONE SEEN /LPF
Ketones, ur: NEGATIVE
NITRITE: NEGATIVE
PH: 7 (ref 5.0–8.0)
PROTEIN: NEGATIVE
RBC / HPF: NONE SEEN /HPF (ref 0–2)
Specific Gravity, Urine: 1.008 (ref 1.001–1.03)

## 2017-11-15 LAB — COMPLETE METABOLIC PANEL WITH GFR
AG Ratio: 1.5 (calc) (ref 1.0–2.5)
ALT: 16 U/L (ref 6–29)
AST: 18 U/L (ref 10–35)
Albumin: 4.3 g/dL (ref 3.6–5.1)
Alkaline phosphatase (APISO): 72 U/L (ref 33–130)
BUN: 18 mg/dL (ref 7–25)
CALCIUM: 10 mg/dL (ref 8.6–10.4)
CHLORIDE: 103 mmol/L (ref 98–110)
CO2: 29 mmol/L (ref 20–32)
Creat: 0.79 mg/dL (ref 0.50–0.99)
GFR, EST AFRICAN AMERICAN: 92 mL/min/{1.73_m2} (ref >=60)
GFR, EST NON AFRICAN AMERICAN: 79 mL/min/{1.73_m2} (ref >=60)
GLUCOSE: 95 mg/dL (ref 65–139)
Globulin: 2.8 g/dL (calc) (ref 1.9–3.7)
Potassium: 4.4 mmol/L (ref 3.5–5.3)
Sodium: 138 mmol/L (ref 135–146)
TOTAL PROTEIN: 7.1 g/dL (ref 6.1–8.1)
Total Bilirubin: 0.5 mg/dL (ref 0.2–1.2)

## 2017-11-15 LAB — CBC
HCT: 41.6 % (ref 35.0–45.0)
Hemoglobin: 14.2 g/dL (ref 11.7–15.5)
MCH: 30.6 pg (ref 27.0–33.0)
MCHC: 34.1 g/dL (ref 32.0–36.0)
MCV: 89.7 fL (ref 80.0–100.0)
MPV: 9.1 fL (ref 7.5–12.5)
Platelets: 323 10*3/uL (ref 140–400)
RBC: 4.64 10*6/uL (ref 3.80–5.10)
RDW: 12.5 % (ref 11.0–15.0)
WBC: 5.1 10*3/uL (ref 3.8–10.8)

## 2017-11-15 LAB — LIPASE: Lipase: 43 U/L (ref 7–60)

## 2017-11-15 NOTE — Telephone Encounter (Signed)
Patient returned call for results- per CRM. Patient notified and she will await urine culture. She uses [facility]/[address].    Results not in basket.

## 2017-11-21 ENCOUNTER — Encounter: Payer: Self-pay | Admitting: Family Medicine

## 2017-11-21 NOTE — Progress Notes (Signed)
Spoke to lab about add on culture

## 2017-11-22 ENCOUNTER — Telehealth: Payer: Self-pay | Admitting: Nurse Practitioner

## 2017-11-22 NOTE — Telephone Encounter (Signed)
Please call patient to see how her symptoms of abdominal pain are. The urine culture needs to be obtained. If she is having urinary frequency and dysuria I can send in an antibiotic but would still like her to come in the next week or so if she is still having symptoms.

## 2017-11-23 ENCOUNTER — Other Ambulatory Visit: Payer: Self-pay | Admitting: Nurse Practitioner

## 2017-11-23 DIAGNOSIS — N3090 Cystitis, unspecified without hematuria: Secondary | ICD-10-CM

## 2017-11-23 DIAGNOSIS — C44519 Basal cell carcinoma of skin of other part of trunk: Secondary | ICD-10-CM | POA: Diagnosis not present

## 2017-11-23 MED ORDER — NITROFURANTOIN MONOHYD MACRO 100 MG PO CAPS: 100.0000 mg | ORAL_CAPSULE | Freq: Two times a day (BID) | ORAL | 0 refills | Status: DC

## 2017-11-23 NOTE — Telephone Encounter (Signed)
Patient stated she has picked up script and she is feeling a little better. Will come in next week to leave a specimen for recheck

## 2017-11-30 ENCOUNTER — Other Ambulatory Visit: Payer: Self-pay

## 2017-11-30 DIAGNOSIS — N3090 Cystitis, unspecified without hematuria: Secondary | ICD-10-CM | POA: Diagnosis not present

## 2017-12-01 LAB — URINE CULTURE
MICRO NUMBER: 90763573
SPECIMEN QUALITY:: ADEQUATE

## 2017-12-22 DIAGNOSIS — H2513 Age-related nuclear cataract, bilateral: Secondary | ICD-10-CM | POA: Diagnosis not present

## 2018-01-30 ENCOUNTER — Ambulatory Visit
Admission: RE | Admit: 2018-01-30 | Discharge: 2018-01-30 | Disposition: A | Discharge status: Home / Self Care | Payer: 59 | Source: Ambulatory Visit | Attending: Urology | Admitting: Urology

## 2018-01-30 ENCOUNTER — Other Ambulatory Visit: Payer: Self-pay | Admitting: Urology

## 2018-01-30 ENCOUNTER — Ambulatory Visit: Payer: 59 | Admitting: Urology

## 2018-01-30 ENCOUNTER — Encounter: Payer: Self-pay | Admitting: Urology

## 2018-01-30 VITALS — BP 165/84 | HR 72 | Ht 64.0 in | Wt 156.4 lb

## 2018-01-30 DIAGNOSIS — Z87440 Personal history of urinary (tract) infections: Secondary | ICD-10-CM

## 2018-01-30 DIAGNOSIS — Z87442 Personal history of urinary calculi: Secondary | ICD-10-CM

## 2018-01-30 DIAGNOSIS — N200 Calculus of kidney: Secondary | ICD-10-CM | POA: Diagnosis not present

## 2018-01-30 LAB — URINALYSIS, COMPLETE
Bilirubin, UA: NEGATIVE
Glucose, UA: NEGATIVE
KETONES UA: NEGATIVE
Nitrite, UA: NEGATIVE
Protein, UA: NEGATIVE
RBC UA: NEGATIVE
SPEC GRAV UA: 1.01 (ref 1.005–1.030)
UUROB: 0.2 mg/dL (ref 0.2–1.0)
pH, UA: 6.5 (ref 5.0–7.5)

## 2018-01-30 NOTE — Progress Notes (Signed)
01/30/2018 11:33 AM   Arsenio Katz 10-Sep-1955 [id number]  Referring provider: Arnetha Courser, MD [address] [place], [place] [address]  Chief Complaint  Patient presents with  . Nephrolithiasis  . Recurrent UTI    HPI: 62 year old female with a history of stone disease presents for a six-month follow-up.  She was seen on 06/16/2017 after an episode of left renal colic secondary to a 2 mm left UVJ calculus.  She elected prophylactic treatment of 4 and 5 mm nonobstructing left lower pole calculi by shockwave lithotripsy which was performed on 07/14/2017. She was last seen on 08/03/2017 and was doing well.  No fragments were identified on KUB.  She has been treated for 2 urinary tract infections in March and May.  A KUB performed today was reviewed and there are no obvious calcifications suspicious for urinary tract stones.  There are faint calcifications overlying the left renal outline.  She states she did do her 24-hour urine study however I never received the results from [place].  PMH: Past Medical History:  Diagnosis Date  . GERD (gastroesophageal reflux disease)   . IBS (irritable bowel syndrome) 2019  . Mitral valve regurgitation    mild  . Mitral valve regurgitation    mild  . Osteopenia   . Osteopenia   . Overweight (BMI 25.0-29.9) 08/11/2016  . Reflux   . Renal calculi 07/2017    Surgical History: Past Surgical History:  Procedure Laterality Date  . CESAREAN SECTION  1987  . COLONOSCOPY WITH PROPOFOL N/A 12/05/2014   Procedure: COLONOSCOPY WITH PROPOFOL;  Surgeon: Lucilla Lame, MD;  Location: [facility] END[facility];  Service: Endoscopy;  Laterality: N/A;  . EXTRACORPOREAL SHOCK WAVE LITHOTRIPSY Left 07/14/2017   Procedure: EXTRACORPOREAL SHOCK WAVE LITHOTRIPSY (ESWL);  Surgeon: Abbie Sons, MD;  Location: [facility] ORS;  Service: Urology;  Laterality: Left;    Home Medications:  Allergies as of 01/30/2018   No Known Allergies     Medication List        Accurate as of 01/30/18 11:33 AM. Always use your most recent med list.          acetaminophen 500 MG tablet Commonly known as:  TYLENOL Take 500 mg by mouth every 6 (six) hours as needed.   aspirin 81 MG tablet Take 81 mg by mouth daily.   Calcium Citrate-Vitamin D 200-250 MG-UNIT Tabs Take by mouth daily.   metoprolol succinate 25 MG 24 hr tablet Commonly known as:  TOPROL-XL Take 1 tablet (25 mg total) by mouth daily.   multivitamin capsule Take 1 capsule by mouth daily.   nitrofurantoin (macrocrystal-monohydrate) 100 MG capsule Commonly known as:  MACROBID Take 1 capsule (100 mg total) by mouth 2 (two) times daily.   OSTEO BI-FLEX REGULAR STRENGTH PO Take by mouth daily.   vitamin C 500 MG tablet Commonly known as:  ASCORBIC ACID Take 500 mg by mouth daily.       Allergies: No Known Allergies  Family History: Family History  Problem Relation Age of Onset  . Aneurysm Mother        brain  . Hypertension Mother   . Stroke Mother        possibly mini strokes  . Heart disease Father   . Heart attack Father   . Mental retardation Father   . Cancer Brother   . Diabetes Maternal Aunt   . Hypertension Sister   . Obesity Sister   . Mental illness Sister   . Cancer Maternal  Grandmother   . Hypertension Sister   . Mental illness Sister   . Depression Sister   . Hypertension Sister   . Depression Sister   . COPD Neg Hx   . Breast cancer Neg Hx     Social History:  reports that she quit smoking about 32 years ago. Her smoking use included cigarettes. She has a 5.00 pack-year smoking history. She has never used smokeless tobacco. She reports that she drinks alcohol. She reports that she does not use drugs.  ROS: UROLOGY Frequent Urination?: No Hard to postpone urination?: No Burning/pain with urination?: No Get up at night to urinate?: Yes Leakage of urine?: No Urine stream starts and stops?: No Trouble starting stream?: No Do you have to strain to  urinate?: No Blood in urine?: No Urinary tract infection?: Yes Sexually transmitted disease?: No Injury to kidneys or bladder?: No Painful intercourse?: No Weak stream?: No Currently pregnant?: No Vaginal bleeding?: No Last menstrual period?: n  Gastrointestinal Nausea?: No Vomiting?: No Indigestion/heartburn?: No Diarrhea?: No Constipation?: No  Constitutional Fever: No Night sweats?: No Weight loss?: No Fatigue?: No  Skin Skin rash/lesions?: No Itching?: No  Eyes Blurred vision?: No Double vision?: No  Ears/Nose/Throat Sore throat?: No Sinus problems?: No  Hematologic/Lymphatic Swollen glands?: No Easy bruising?: No  Cardiovascular Leg swelling?: No Chest pain?: No  Respiratory Cough?: No Shortness of breath?: No  Endocrine Excessive thirst?: No  Musculoskeletal Back pain?: Yes Joint pain?: No  Neurological Headaches?: No Dizziness?: No  Psychologic Depression?: No Anxiety?: No  Physical Exam: BP (!) 165/84 (BP Location: Left Arm, Patient Position: Sitting, Cuff Size: Large)   Pulse 72   Ht 5\' 4"  (1.626 m)   Wt 156 lb 6.4 oz (70.9 kg)   BMI 26.85 kg/m   Constitutional:  Alert and oriented, No acute distress. HEENT: [place] AT, moist mucus membranes.  Trachea midline, no masses. Cardiovascular: No clubbing, cyanosis, or edema. Respiratory: Normal respiratory effort, no increased work of breathing. GI: Abdomen is soft, nontender, nondistended, no abdominal masses GU: No CVA tenderness Lymph: No cervical or inguinal lymphadenopathy. Skin: No rashes, bruises or suspicious lesions. Neurologic: Grossly intact, no focal deficits, moving all 4 extremities. Psychiatric: Normal mood and affect.  Laboratory Data:  Urinalysis Dipstick/microscopy negative  Pertinent Imaging: KUB personally reviewed Results for orders placed during the hospital encounter of 01/30/18  Abdomen 1 view (KUB)   Narrative CLINICAL DATA:  History of left renal calculi  and prior lithotripsy  EXAM: ABDOMEN - 1 VIEW  COMPARISON:  08/03/2017  FINDINGS: Scattered large and small bowel gas is noted. No abnormal mass is identified. Questionable tiny density is noted in the lower pole of the left kidney. No definitive ureteral stones are seen. The right kidney is within normal limits. No bony abnormality is noted.  IMPRESSION: Question tiny stone in the lower pole of the left kidney.   Electronically Signed   By: Inez Catalina M.D.   On: 01/30/2018 09:02       Assessment & Plan:   Will track down and she will be contacted regarding her [facility] results.  Follow-up 1 year with a KUB.  We discussed strategies for UTI prevention.  There was no relation of her symptoms to intercourse.  I discussed cranberry tablets and low-dose vaginal estrogen.  She would like to hold off on the estrogen at present and will start the cranberry tablets.  Follow-up earlier as needed for recurrent UTI symptoms.   Abbie Sons, MD  [facility] [address], [place], [place] [address] [phone]

## 2018-02-22 ENCOUNTER — Telehealth: Payer: Self-pay | Admitting: Urology

## 2018-02-22 NOTE — Telephone Encounter (Signed)
Please let patient know her 24-hour urine study and blood work was reviewed.  Blood work showed no abnormalities.  There were no significant abnormalities noted on her 24-hour urine study.  Her urine volume was excellent at 2.25 L.  Urine calcium and, oxalate and citrate levels were all normal.  She is fine to continue her present diet and would continue her level of fluid intake.  She has a follow-up scheduled in April.

## 2018-03-22 ENCOUNTER — Encounter: Payer: Self-pay | Admitting: Family Medicine

## 2018-03-28 DIAGNOSIS — Z7982 Long term (current) use of aspirin: Secondary | ICD-10-CM | POA: Insufficient documentation

## 2018-03-28 DIAGNOSIS — Z79899 Other long term (current) drug therapy: Secondary | ICD-10-CM | POA: Diagnosis not present

## 2018-03-28 DIAGNOSIS — I10 Essential (primary) hypertension: Secondary | ICD-10-CM | POA: Insufficient documentation

## 2018-03-28 DIAGNOSIS — Z87891 Personal history of nicotine dependence: Secondary | ICD-10-CM | POA: Diagnosis not present

## 2018-03-28 DIAGNOSIS — R200 Anesthesia of skin: Secondary | ICD-10-CM | POA: Insufficient documentation

## 2018-03-28 DIAGNOSIS — R202 Paresthesia of skin: Secondary | ICD-10-CM | POA: Diagnosis not present

## 2018-03-28 DIAGNOSIS — R6884 Jaw pain: Secondary | ICD-10-CM | POA: Insufficient documentation

## 2018-03-28 LAB — CBC
HEMATOCRIT: 42.3 % (ref 36.0–46.0)
HEMOGLOBIN: 14.3 g/dL (ref 12.0–15.0)
MCH: 31 pg (ref 26.0–34.0)
MCHC: 33.8 g/dL (ref 30.0–36.0)
MCV: 91.8 fL (ref 80.0–100.0)
NRBC: 0 % (ref 0.0–0.2)
PLATELETS: 302 10*3/uL (ref 150–400)
RBC: 4.61 MIL/uL (ref 3.87–5.11)
RDW: 12.9 % (ref 11.5–15.5)
WBC: 7.5 10*3/uL (ref 4.0–10.5)

## 2018-03-28 LAB — COMPREHENSIVE METABOLIC PANEL
ALBUMIN: 4.3 g/dL (ref 3.5–5.0)
ALT: 24 U/L (ref 0–44)
ANION GAP: 8 (ref 5–15)
AST: 25 U/L (ref 15–41)
Alkaline Phosphatase: 66 U/L (ref 38–126)
BUN: 19 mg/dL (ref 8–23)
CHLORIDE: 103 mmol/L (ref 98–111)
CO2: 26 mmol/L (ref 22–32)
Calcium: 9.1 mg/dL (ref 8.9–10.3)
Creatinine, Ser: 0.92 mg/dL (ref 0.44–1.00)
GFR calc Af Amer: >60 mL/min (ref >60)
GFR calc non Af Amer: >60 mL/min (ref >60)
GLUCOSE: 113 mg/dL — AB (ref 70–99)
POTASSIUM: 3.5 mmol/L (ref 3.5–5.1)
SODIUM: 137 mmol/L (ref 135–145)
TOTAL PROTEIN: 7.4 g/dL (ref 6.5–8.1)
Total Bilirubin: 0.6 mg/dL (ref 0.3–1.2)

## 2018-03-28 LAB — DIFFERENTIAL
Abs Immature Granulocytes: 0.02 10*3/uL (ref 0.00–0.07)
BASOS ABS: 0.1 10*3/uL (ref 0.0–0.1)
BASOS PCT: 1 %
EOS ABS: 0.2 10*3/uL (ref 0.0–0.5)
EOS PCT: 2 %
IMMATURE GRANULOCYTES: 0 %
Lymphocytes Relative: 35 %
Lymphs Abs: 2.6 10*3/uL (ref 0.7–4.0)
Monocytes Absolute: 0.7 10*3/uL (ref 0.1–1.0)
Monocytes Relative: 9 %
Neutro Abs: 3.9 10*3/uL (ref 1.7–7.7)
Neutrophils Relative %: 53 %

## 2018-03-28 LAB — TROPONIN I: Troponin I: <0.03 ng/mL (ref <0.03)

## 2018-03-28 NOTE — ED Triage Notes (Signed)
Pt states L sided jaw numbness about 11p last night. Speech clear. Equal smile. No vision changes. Intermittent numbness. Ambulatory. No weakness noted.

## 2018-03-28 NOTE — ED Notes (Signed)
Blue top and urine sent to lab as well.

## 2018-03-29 ENCOUNTER — Emergency Department
Admission: EM | Admit: 2018-03-29 | Discharge: 2018-03-29 | Disposition: A | Discharge status: Home / Self Care | Payer: 59 | Attending: Emergency Medicine | Admitting: Emergency Medicine

## 2018-03-29 DIAGNOSIS — R200 Anesthesia of skin: Secondary | ICD-10-CM

## 2018-03-29 HISTORY — DX: Essential (primary) hypertension: I10

## 2018-03-29 NOTE — ED Provider Notes (Signed)
[facility] Emergency Department Provider Note  ____________________________________________   First MD Initiated Contact with Patient 03/29/18 [phone]     (approximate)  I have reviewed the triage vital signs and the nursing notes.   HISTORY  Chief Complaint Numbness    HPI Alexandria Hill is a 62 y.o. female with medical history as listed below which most notably includes hypertension who presents for evaluation of acute onset left-sided jaw numbness more than 24 hours ago.  She reports that it was numb but also somewhat painful and uncomfortable.  It occurred right before bed and it caused her to have some difficulty getting to sleep.  It resolved when she was awake today but then seemed to come back intermittent late over the course the day and was a mild dull ache but mostly numbness.  It is isolated to her left lower jaw.  Nothing in particular made it better or worse.  She has had no numbness or weakness in any of her extremities, no word finding difficulties, no difficulty with ambulation, and she denies fever/chills, chest pain, shortness of breath, difficulty swallowing, nausea, vomiting, and abdominal pain.  She has not had similar symptoms in the past she had a stress test about a year and half ago which was normal.  She has no history of diabetes or hyperlipidemia.  Her father had a heart attack around age 39 but she has no personal cardiac history.  She reports that she came to the emergency department because her daughter warned her that it could be the sign of a heart attack.  Past Medical History:  Diagnosis Date  . GERD (gastroesophageal reflux disease)   . Hypertension   . IBS (irritable bowel syndrome) 2019  . Mitral valve regurgitation    mild  . Mitral valve regurgitation    mild  . Osteopenia   . Osteopenia   . Overweight (BMI 25.0-29.9) 08/11/2016  . Reflux   . Renal calculi 07/2017    Patient Active Problem List   Diagnosis Date Noted    . IBS (irritable bowel syndrome) 11/14/2017  . Sciatic nerve pain, right 11/14/2017  . Personal history of kidney stones 11/14/2017  . Essential hypertension, benign 08/11/2016  . Overweight (BMI 25.0-29.9) 08/11/2016  . Heartburn 08/11/2016  . Varicella exposure 04/24/2015  . Osteopenia     Past Surgical History:  Procedure Laterality Date  . CESAREAN SECTION  1987  . COLONOSCOPY WITH PROPOFOL N/A 12/05/2014   Procedure: COLONOSCOPY WITH PROPOFOL;  Surgeon: Lucilla Lame, MD;  Location: [facility] ENDOSCOPY;  Service: Endoscopy;  Laterality: N/A;  . EXTRACORPOREAL SHOCK WAVE LITHOTRIPSY Left 07/14/2017   Procedure: EXTRACORPOREAL SHOCK WAVE LITHOTRIPSY (ESWL);  Surgeon: Abbie Sons, MD;  Location: [facility] ORS;  Service: Urology;  Laterality: Left;    Prior to Admission medications   Medication Sig Start Date End Date Taking? Authorizing Provider  acetaminophen (TYLENOL) 500 MG tablet Take 500 mg by mouth every 6 (six) hours as needed.    [provider]  aspirin 81 MG tablet Take 81 mg by mouth daily.    [provider]  Calcium Citrate-Vitamin D 200-250 MG-UNIT TABS Take by mouth daily.    [provider]  Glucosamine-Chondroitin (OSTEO BI-FLEX REGULAR STRENGTH PO) Take by mouth daily.     [provider]  metoprolol succinate (TOPROL-XL) 25 MG 24 hr tablet Take 1 tablet (25 mg total) by mouth daily. 11/14/17   Arnetha Courser, MD  Multiple Vitamin (MULTIVITAMIN) capsule Take 1  capsule by mouth daily.    [provider]  nitrofurantoin, macrocrystal-monohydrate, (MACROBID) 100 MG capsule Take 1 capsule (100 mg total) by mouth 2 (two) times daily. 11/23/17   Poulose, Bethel Born, NP  vitamin C (ASCORBIC ACID) 500 MG tablet Take 500 mg by mouth daily.    [provider]    Allergies Patient has no known allergies.  Family History  Problem Relation Age of Onset  . Aneurysm Mother        brain  . Hypertension Mother   . Stroke Mother         possibly mini strokes  . Heart disease Father   . Heart attack Father   . Mental retardation Father   . Cancer Brother   . Diabetes Maternal Aunt   . Hypertension Sister   . Obesity Sister   . Mental illness Sister   . Cancer Maternal Grandmother   . Hypertension Sister   . Mental illness Sister   . Depression Sister   . Hypertension Sister   . Depression Sister   . COPD Neg Hx   . Breast cancer Neg Hx     Social History Social History   Tobacco Use  . Smoking status: Former Smoker    Packs/day: 0.50    Years: 10.00    Pack years: 5.00    Types: Cigarettes    Last attempt to quit: 04/23/1985    Years since quitting: 32.9  . Smokeless tobacco: Never Used  Substance Use Topics  . Alcohol use: Yes    Comment: occasional  . Drug use: No    Review of Systems Constitutional: No fever/chills Eyes: No visual changes. ENT: No sore throat. Cardiovascular: Denies chest pain. Respiratory: Denies shortness of breath. Gastrointestinal: No abdominal pain.  No nausea, no vomiting.  No diarrhea.  No constipation. Genitourinary: Negative for dysuria. Musculoskeletal: Left-sided jaw numbness and occasional aching pain as described above.  Negative for neck pain.  Negative for back pain. Integumentary: Negative for rash. Neurological: Negative for headaches, focal weakness or numbness.   ____________________________________________   PHYSICAL EXAM:  VITAL SIGNS: ED Triage Vitals  Enc Vitals Group     BP 03/28/18 2006 (!) 181/89     Pulse Rate 03/28/18 2005 91     Resp 03/28/18 2005 16     Temp 03/28/18 2005 97.9 F (36.6 C)     Temp Source 03/28/18 2005 Oral     SpO2 03/28/18 2005 100 %     Weight 03/28/18 2007 70.3 kg (155 lb)     Height 03/28/18 2007 1.6 m (5\' 3" )     Head Circumference --      Peak Flow --      Pain Score 03/28/18 2005 5     Pain Loc --      Pain Edu? --      Excl. in [place]? --     Constitutional: Alert and oriented. Well appearing and in  no acute distress. Eyes: Conjunctivae are normal.  Head: Atraumatic. Nose: No congestion/rhinnorhea. Mouth/Throat: Mucous membranes are moist. Neck: No stridor.  No meningeal signs.  No palpable lymphadenopathy.  No carotid bruits. Cardiovascular: Normal rate, regular rhythm. Good peripheral circulation. Grossly normal heart sounds. Respiratory: Normal respiratory effort.  No retractions. Lungs CTAB. Gastrointestinal: Soft and nontender. No distention.  Musculoskeletal: No lower extremity tenderness nor edema. No gross deformities of extremities. Neurologic:  Normal speech and language. No gross focal neurologic deficits are appreciated.  Skin:  Skin is warm,  dry and intact. No rash noted. Psychiatric: Mood and affect are normal. Speech and behavior are normal.  ____________________________________________   LABS (all labs ordered are listed, but only abnormal results are displayed)  Labs Reviewed  COMPREHENSIVE METABOLIC PANEL - Abnormal; Notable for the following components:      Result Value   Glucose, Bld 113 (*)    All other components within normal limits  CBC  DIFFERENTIAL  TROPONIN I  CBG MONITORING, ED   ____________________________________________  EKG  ED ECG REPORT I, Hinda Kehr, the attending physician, personally viewed and interpreted this ECG.  Date: 03/28/2018 EKG Time: 20: 13 Rate: 86 Rhythm: normal sinus rhythm QRS Axis: normal Intervals: normal ST/T Wave abnormalities: normal Narrative Interpretation: no evidence of acute ischemia  ____________________________________________  RADIOLOGY   ED MD interpretation: No indication for imaging  Official radiology report(s): No results found.  ____________________________________________   PROCEDURES  Critical Care performed: No   Procedure(s) performed:   Procedures   ____________________________________________   INITIAL IMPRESSION / ASSESSMENT AND PLAN / ED COURSE  As part of my  medical decision making, I reviewed the following data within the [place] History obtained from family, Nursing notes reviewed and incorporated, Labs reviewed , EKG interpreted  and Old chart reviewed  Differential diagnosis includes, but is not limited to, ACS, neuropathic pain such as trigeminal neuralgia, electrolyte or metabolic abnormality, TMJ, less likely infection or CVA or carotid dissection.  The patient has no focal neurological deficits on thorough neurological exam.  She has somewhat waxing and waning symptoms and will intermittently describe it as numbness versus a dull pain.  She has had symptoms for more than 24 hours and has a completely normal lab work-up including negative troponin, normal CBC, normal CMP, and she has stable vital signs except for some hypertension.  She has no carotid bruits or palpable lymphadenopathy and there is no indication for imaging at this time.  Based on the description I think it is very unlikely that this is the result of carotid dissection she has no Tenderness nor bruits.  I provided reassurance to the patient encouraged her to follow-up as an outpatient with her primary care doctor for possible additional outpatient imaging that could include carotid Dopplers, MRIs, stress test, etc.  Over at this point she has a reassuring exam with no evidence of any acute or emergent medical conditions and she is comfortable with the plan for discharge and outpatient follow-up.  I gave my usual and customary return precautions.       ____________________________________________  FINAL CLINICAL IMPRESSION(S) / ED DIAGNOSES  Final diagnoses:  Left jaw numbness     MEDICATIONS GIVEN DURING THIS VISIT:  Medications - No data to display   ED Discharge Orders    None       Note:  This document was prepared using Dragon voice recognition software and may include unintentional dictation errors.    Hinda Kehr, MD 03/29/18 [phone]

## 2018-03-29 NOTE — Discharge Instructions (Signed)
Your workup in the Emergency Department today was reassuring.  We did not find any specific abnormalities.  We recommend you drink plenty of fluids, take your regular medications and/or any new ones prescribed today, and follow up with the doctor(s) listed in these documents as recommended.  Return to the Emergency Department if you develop new or worsening symptoms that concern you.  

## 2018-03-30 ENCOUNTER — Encounter: Payer: Self-pay | Admitting: Family Medicine

## 2018-03-30 ENCOUNTER — Ambulatory Visit: Payer: 59 | Admitting: Family Medicine

## 2018-03-30 VITALS — BP 138/90 | HR 81 | Temp 98.0°F | Ht 64.0 in | Wt 151.9 lb

## 2018-03-30 DIAGNOSIS — R6884 Jaw pain: Secondary | ICD-10-CM | POA: Diagnosis not present

## 2018-03-30 DIAGNOSIS — I10 Essential (primary) hypertension: Secondary | ICD-10-CM

## 2018-03-30 DIAGNOSIS — Z0000 Encounter for general adult medical examination without abnormal findings: Secondary | ICD-10-CM

## 2018-03-30 MED ORDER — METOPROLOL SUCCINATE ER 50 MG PO TB24: 50.0000 mg | ORAL_TABLET | Freq: Every day | ORAL | 0 refills | Status: DC

## 2018-03-30 NOTE — Progress Notes (Signed)
BP 138/90   Pulse 81   Temp 98 F (36.7 C)   Ht 5\' 4"  (1.626 m)   Wt 151 lb 14.4 oz (68.9 kg)   SpO2 98%   BMI 26.07 kg/m    Subjective:    Patient ID: Alexandria Hill, female    DOB: 04-02-1956, 62 y.o.   MRN: [id number]  HPI: SWEDEN LESURE is a 62 y.o. female  Chief Complaint  Patient presents with  . Follow-up    Pt already had appt but was seen in ER on Tuesday    HPI Patient is here for regular f/u but also was seen in the ER on Oct 22nd; note reviewed ER note reviewed, Dr. Hinda Kehr saw her; no chest pain whatsoever Per note, she had acute onset left-sided jaw numbness more than 24 hours prior to presentation to the ER; numb, but also painful and uncomfortable; right before bed; difficulty falling asleep, resolved when she woke up, the came back through the course of the day; left lower jaw; nothing improved or worsened sx; no other s/s of stroke per ER HPI Father had open heart surgery in his 77s and then passed at age 38 from an MI  BP yesterday in the ER was 181/89; EKG was normal, no evidence of acute ischemia No imaging done Trop I negative, glucose 113, normal CBC She has episodes of acid reflux in her 30s or 40s; mild mitral regurgitation found at that time She has a cardiologist Reviewed note from her visit with Dr. Yvone Neu March 2018  Lab Results  Component Value Date   CHOL 191 03/30/2018   HDL 55 03/30/2018   LDLCALC 113 (H) 03/30/2018   TRIG 120 03/30/2018   CHOLHDL 3.5 03/30/2018   The 10-year ASCVD risk score Mikey Bussing DC Jr., et al., 2013) is: 6%   Values used to calculate the score:     Age: 66 years     Sex: Female     Is Non-Hispanic African American: No     Diabetic: No     Tobacco smoker: No     Systolic Blood Pressure: 154 mmHg     Is BP treated: Yes     HDL Cholesterol: 55 mg/dL     Total Cholesterol: 191 mg/dL   Depression screen [facility] 2/9 03/30/2018 05/02/2017 10/08/2016 08/11/2016 04/26/2016  Decreased Interest 0 0 0 0 0  Down,  Depressed, Hopeless 0 0 0 0 0  PHQ - 2 Score 0 0 0 0 0  Altered sleeping 0 - - - -  Tired, decreased energy 0 - - - -  Change in appetite 0 - - - -  Feeling bad or failure about yourself  0 - - - -  Trouble concentrating 0 - - - -  Moving slowly or fidgety/restless 0 - - - -  Suicidal thoughts 0 - - - -  PHQ-9 Score 0 - - - -  Difficult doing work/chores Not difficult at all - - - -   Fall Risk  03/30/2018 05/02/2017 10/08/2016 08/11/2016 04/26/2016  Falls in the past year? No No No No No    Relevant past medical, surgical, family and social history reviewed Past Medical History:  Diagnosis Date  . GERD (gastroesophageal reflux disease)   . Hypertension   . IBS (irritable bowel syndrome) 2019  . Osteopenia   . Overweight (BMI 25.0-29.9) 08/11/2016  . Reflux   . Renal calculi 07/2017   Past Surgical History:  Procedure Laterality Date  .  CESAREAN SECTION  1987  . COLONOSCOPY WITH PROPOFOL N/A 12/05/2014   Procedure: COLONOSCOPY WITH PROPOFOL;  Surgeon: Lucilla Lame, MD;  Location: [facility] ENDOSCOPY;  Service: Endoscopy;  Laterality: N/A;  . EXTRACORPOREAL SHOCK WAVE LITHOTRIPSY Left 07/14/2017   Procedure: EXTRACORPOREAL SHOCK WAVE LITHOTRIPSY (ESWL);  Surgeon: Abbie Sons, MD;  Location: [facility] ORS;  Service: Urology;  Laterality: Left;   Family History  Problem Relation Age of Onset  . Aneurysm Mother        brain  . Hypertension Mother   . Stroke Mother        possibly mini strokes  . Heart disease Father   . Heart attack Father   . Mental retardation Father   . Cancer Brother   . Diabetes Maternal Aunt   . Hypertension Sister   . Obesity Sister   . Mental illness Sister   . Cancer Maternal Grandmother   . Hypertension Sister   . Mental illness Sister   . Depression Sister   . Hypertension Sister   . Depression Sister   . COPD Neg Hx   . Breast cancer Neg Hx    Social History   Tobacco Use  . Smoking status: Former Smoker    Packs/day: 0.50    Years: 10.00     Pack years: 5.00    Types: Cigarettes    Last attempt to quit: 04/23/1985    Years since quitting: 32.9  . Smokeless tobacco: Never Used  Substance Use Topics  . Alcohol use: Yes    Comment: occasional  . Drug use: No     Office Visit from 03/30/2018 in [facility]  AUDIT-C Score  1      Interim medical history since last visit reviewed. Allergies and medications reviewed  Review of Systems  Constitutional: Negative for unexpected weight change (trying to eat better, on purpose).  Cardiovascular: Positive for palpitations (rapid, not skipped). Negative for chest pain.   Per HPI unless specifically indicated above     Objective:    BP 138/90   Pulse 81   Temp 98 F (36.7 C)   Ht 5\' 4"  (1.626 m)   Wt 151 lb 14.4 oz (68.9 kg)   SpO2 98%   BMI 26.07 kg/m   Wt Readings from Last 3 Encounters:  04/03/18 154 lb 4 oz (70 kg)  03/31/18 154 lb 1.7 oz (69.9 kg)  03/30/18 151 lb 14.4 oz (68.9 kg)    Physical Exam  Constitutional: She appears well-developed and well-nourished. No distress.  HENT:  Head: Normocephalic and atraumatic.  Eyes: EOM are normal. No scleral icterus.  Neck: No thyromegaly present.  Cardiovascular: Normal rate, regular rhythm and normal heart sounds.  No murmur heard. Pulmonary/Chest: Effort normal and breath sounds normal. No respiratory distress. She has no wheezes.  Abdominal: Soft. Bowel sounds are normal. She exhibits no distension.  Musculoskeletal: She exhibits no edema.  Neurological: She is alert.  Skin: Skin is warm and dry. She is not diaphoretic. No pallor.  Psychiatric: She has a normal mood and affect. Her behavior is normal. Judgment and thought content normal.      Assessment & Plan:   Problem List Items Addressed This Visit      Cardiovascular and Mediastinum   Essential hypertension, benign    Elevated pressures lately; see AVS       Other Visit Diagnoses    Pain in lower jaw    -  Primary   may be  anginal  equivalent; refer to cardiologist; reasons to call 911 reviewed   Relevant Orders   Ambulatory referral to Cardiology   Preventative health care       order screening labs; patient to return for actual physical component   Relevant Orders   Lipid panel (Completed)       Follow up plan: Return in about 1 week (around 04/06/2018) for blood pressure and pulse recheck with CMA.  An after-visit summary was printed and given to the patient at [place].  Please see the patient instructions which may contain other information and recommendations beyond what is mentioned above in the assessment and plan.  Meds ordered this encounter  Medications  . DISCONTD: metoprolol succinate (TOPROL-XL) 50 MG 24 hr tablet    Sig: Take 1 tablet (50 mg total) by mouth daily. Take with or immediately following a meal.    Dispense:  30 tablet    Refill:  0    We're changing the dose    Orders Placed This Encounter  Procedures  . Lipid panel  . Ambulatory referral to Cardiology   Face-to-face time with patient was more than 25 minutes, >50% time spent counseling and coordination of care

## 2018-03-30 NOTE — Patient Instructions (Addendum)
Try to limit saturated fats in your diet (bologna, hot dogs, barbeque, cheeseburgers, hamburgers, steak, bacon, sausage, cheese, etc.) and get more fresh fruits, vegetables, and whole grains We'll have you see the cardiologist Easy walking is fine, nothing exertional, until you see the heart doctor Continue the daily aspirin therapy We'll check your cholesterol   Coronary Artery Disease, Female Coronary artery disease (CAD) is a condition in which the arteries that lead to the heart (coronary arteries) become narrow or blocked. The narrowing or blockage can lead to decreased blood flow to the heart. Prolonged reduced blood flow can cause a heart attack (myocardial infarction or MI). This condition may also be called coronary heart disease. Because CAD is the leading cause of death in women, it is important to understand what causes this condition and how it is treated. What are the causes? CAD is most often caused by atherosclerosis. This is the buildup of fat and cholesterol (plaque) on the inside of the arteries. Over time, the plaque may narrow or block the artery, reducing blood flow to the heart. Plaque can also become weak and break off within a coronary artery and cause a sudden blockage. Other less common causes of CAD include:  An embolism or blood clot in a coronary artery.  A tearing of the artery (spontaneous coronary artery dissection).  An aneurysm.  Inflammation (vasculitis) in the artery wall.  What increases the risk? The following factors may make you more likely to develop this condition:  Age. Women over age 72 are at a greater risk of CAD.  Family history of CAD.  High blood pressure (hypertension).  Diabetes.  High cholesterol levels.  Tobacco use.  Lack of exercise.  Menopause. ? All postmenopausal women are at greater risk of CAD. ? Women who have experienced menopause between the ages of 31-45 (early menopause) are at a higher risk of CAD. ? Women who  have experienced menopause before age 55 (premature menopause) are at a very high risk of CAD.  Excessive alcohol use  A diet high in saturated and trans fats, such as fried food and processed meat.  Other possible risk factors include:  High stress levels.  Depression  Obesity.  Sleep apnea.  What are the signs or symptoms? Many people do not have any symptoms during the early stages of CAD. As the condition progresses, symptoms may include:  Chest pain (angina). The pain can: ? Feel like crushing or squeezing, or a tightness, pressure, fullness, or heaviness in the chest. ? Last more than a few minutes or can stop and recur. The pain tends to get worse with exercise or stress and to fade with rest.  Pain in the arms, neck, jaw, or back.  Unexplained heartburn or indigestion.  Shortness of breath.  Nausea.  Sudden cold sweats.  Sudden light-headedness.  Fluttering or fast heartbeat (palpitations).  Many women have chest discomfort and the other symptoms. However, women often have unusual (atypical) symptoms, such as:  Fatigue.  Vomiting.  Unexplained feelings of nervousness or anxiety.  Unexplained weakness.  Dizziness or fainting.  How is this diagnosed? This condition is diagnosed based on:  Your family and medical history.  A physical exam.  Tests, including: ? A test to check the electrical signals in your heart (electrocardiogram). ? Exercise stress test. This looks for signs of blockage when the heart is stressed with exercise, such as running on a treadmill. ? Pharmacologic stress test. This test looks for signs of blockage when the heart  is being stressed with a medicine. ? Blood tests. ? Coronary angiogram. This is a procedure to look at the coronary arteries to see if there is any blockage. During this test, a dye is injected into your arteries so they appear on an X-ray. ? A test that uses sound waves to take a picture of your heart  (echocardiogram). ? Chest X-ray.  How is this treated? This condition may be treated by:  Healthy lifestyle changes to reduce risk factors.  Medicines such as: ? Antiplatelet medicines and blood-thinning medicines, such as aspirin. These help prevent blood clots. ? Nitroglycerin. ? Blood pressure medicines. ? Cholesterol-lowering medicine.  Coronary angioplasty and stenting. During this procedure, a thin, flexible tube is inserted through a blood vessel and into a blocked artery. A balloon or similar device on the end of the tube is inflated to open up the artery. In some cases, a small, mesh tube (stent) is inserted into the artery to keep it open.  Coronary artery bypass surgery. During this surgery, veins or arteries from other parts of the body are used to create a bypass around the blockage and allow blood to reach your heart.  Follow these instructions at home: Medicines  Take over-the-counter and prescription medicines only as told by your health care provider.  Do not take the following medicines unless your health care provider approves: ? NSAIDs, such as ibuprofen, naproxen, or celecoxib. ? Vitamin supplements that contain vitamin A, vitamin E, or both. ? Hormone replacement therapy that contains estrogen with or without progestin. Lifestyle  Follow an exercise program approved by your health care provider. Aim for 150 minutes of moderate exercise or 75 minutes of vigorous exercise each week.  Maintain a healthy weight or lose weight as approved by your health care provider.  Rest when you are tired.  Learn to manage stress or try to limit your stress. Ask your health care provider for suggestions if you need help.  Get screened for depression and seek treatment, if needed.  Do not use any products that contain nicotine or tobacco, such as cigarettes and e-cigarettes. If you need help quitting, ask your health care provider.  Do not use illegal drugs. Eating and  drinking  Follow a heart-healthy diet. A dietitian can help educate you about healthy food options and changes. In general, eat plenty of fruits and vegetables, lean meats, and whole grains.  Avoid foods high in: ? Sugar. ? Salt (sodium). ? Saturated fats, such as processed or fatty meat. ? Trans fats, such as fried food.  Use healthy cooking methods such as roasting, grilling, broiling, baking, poaching, steaming, or stir-frying.  If you drink alcohol, and your health care provider approves, limit your alcohol intake to no more than 1 drink per day. One drink equals 12 ounces of beer, 5 ounces of wine, or 1 ounces of hard liquor. General instructions  Manage any other health conditions, such as hypertension and diabetes. These conditions affect your heart.  Your health care provider may ask you to monitor your blood pressure. Ideally, your blood pressure should be below 130/80.  Keep all follow-up visits as told by your health care provider. This is important. Get help right away if:  You have pain in your chest, neck, arm, jaw, stomach, or back that: ? Lasts more than a few minutes. ? Is recurring. ? Is not relieved by taking medicine under your tongue (sublingualnitroglycerin).  You have profuse sweating without cause.  You have unexplained: ? Heartburn or  indigestion. ? Shortness of breath or difficulty breathing. ? Fluttering or fast heartbeat (palpitations). ? Nausea or vomiting. ? Fatigue. ? Feelings of nervousness or anxiety. ? Weakness. ? Diarrhea.  You have sudden light-headedness or dizziness.  You faint.  You feel like hurting yourself or think about taking your own life. These symptoms may represent a serious problem that is an emergency. Do not wait to see if the symptoms will go away. Get medical help right away. Call your local emergency services (911 in the [place] Do not drive yourself to the hospital. Summary  Coronary artery disease (CAD) is a  process in which the arteries that lead to the heart (coronary arteries) become narrow or blocked. The narrowing or blockage can lead to a heart attack.  Many women have chest discomfort and other common symptoms of CAD. However, women often have different (atypical) symptoms, such as fatigue, vomiting, and dizziness or weakness.  CAD can be treated with lifestyle changes, medicines, surgery, or a combination of these treatments. This information is not intended to replace advice given to you by your health care provider. Make sure you discuss any questions you have with your health care provider. Document Released: 08/16/2011 Document Revised: 05/14/2016 Document Reviewed: 05/14/2016 Elsevier Interactive Patient Education  [employer].

## 2018-03-31 ENCOUNTER — Emergency Department: Payer: Commercial Managed Care - HMO

## 2018-03-31 ENCOUNTER — Telehealth: Payer: Self-pay | Admitting: Family Medicine

## 2018-03-31 ENCOUNTER — Encounter: Payer: Self-pay | Admitting: Emergency Medicine

## 2018-03-31 ENCOUNTER — Other Ambulatory Visit: Payer: Self-pay

## 2018-03-31 ENCOUNTER — Emergency Department
Admission: EM | Admit: 2018-03-31 | Discharge: 2018-03-31 | Disposition: A | Discharge status: Home / Self Care | Payer: Commercial Managed Care - HMO | Attending: Student in an Organized Health Care Education/Training Program | Admitting: Student in an Organized Health Care Education/Training Program

## 2018-03-31 DIAGNOSIS — R200 Anesthesia of skin: Secondary | ICD-10-CM | POA: Diagnosis not present

## 2018-03-31 DIAGNOSIS — Z7982 Long term (current) use of aspirin: Secondary | ICD-10-CM | POA: Diagnosis not present

## 2018-03-31 DIAGNOSIS — Z79899 Other long term (current) drug therapy: Secondary | ICD-10-CM | POA: Insufficient documentation

## 2018-03-31 DIAGNOSIS — R079 Chest pain, unspecified: Secondary | ICD-10-CM | POA: Diagnosis not present

## 2018-03-31 DIAGNOSIS — Z87891 Personal history of nicotine dependence: Secondary | ICD-10-CM | POA: Insufficient documentation

## 2018-03-31 DIAGNOSIS — R0789 Other chest pain: Secondary | ICD-10-CM | POA: Diagnosis present

## 2018-03-31 DIAGNOSIS — I10 Essential (primary) hypertension: Secondary | ICD-10-CM | POA: Diagnosis not present

## 2018-03-31 LAB — LIPID PANEL
CHOLESTEROL: 191 mg/dL (ref <200)
HDL: 55 mg/dL (ref >50)
LDL Cholesterol (Calc): 113 mg/dL (calc) — ABNORMAL HIGH
Non-HDL Cholesterol (Calc): 136 mg/dL (calc) — ABNORMAL HIGH (ref <130)
TRIGLYCERIDES: 120 mg/dL (ref <150)
Total CHOL/HDL Ratio: 3.5 (calc) (ref <5.0)

## 2018-03-31 LAB — CBC
HEMATOCRIT: 43.6 % (ref 36.0–46.0)
HEMOGLOBIN: 14.4 g/dL (ref 12.0–15.0)
MCH: 30.5 pg (ref 26.0–34.0)
MCHC: 33 g/dL (ref 30.0–36.0)
MCV: 92.4 fL (ref 80.0–100.0)
Platelets: 326 10*3/uL (ref 150–400)
RBC: 4.72 MIL/uL (ref 3.87–5.11)
RDW: 12.7 % (ref 11.5–15.5)
WBC: 7 10*3/uL (ref 4.0–10.5)
nRBC: 0 % (ref 0.0–0.2)

## 2018-03-31 LAB — BASIC METABOLIC PANEL
ANION GAP: 8 (ref 5–15)
BUN: 16 mg/dL (ref 8–23)
CHLORIDE: 104 mmol/L (ref 98–111)
CO2: 26 mmol/L (ref 22–32)
Calcium: 9.2 mg/dL (ref 8.9–10.3)
Creatinine, Ser: 0.77 mg/dL (ref 0.44–1.00)
GFR calc Af Amer: >60 mL/min (ref >60)
GFR calc non Af Amer: >60 mL/min (ref >60)
GLUCOSE: 128 mg/dL — AB (ref 70–99)
Potassium: 3.8 mmol/L (ref 3.5–5.1)
Sodium: 138 mmol/L (ref 135–145)

## 2018-03-31 LAB — TROPONIN I
Troponin I: <0.03 ng/mL (ref <0.03)
Troponin I: <0.03 ng/mL (ref <0.03)

## 2018-03-31 MED ORDER — AMLODIPINE BESYLATE 5 MG PO TABS
5.0000 mg | ORAL_TABLET | Freq: Once | ORAL | Status: AC
  Administered 2018-03-31: 5 mg via ORAL
  Filled 2018-03-31: qty 1

## 2018-03-31 MED ORDER — LISINOPRIL 5 MG PO TABS: 5.0000 mg | ORAL_TABLET | Freq: Every day | ORAL | 0 refills | Status: DC

## 2018-03-31 NOTE — ED Provider Notes (Signed)
[facility] Emergency Department Provider Note    First MD Initiated Contact with Patient 03/31/18 1856     (approximate)  I have reviewed the triage vital signs and the nursing notes.   HISTORY  Chief Complaint Chest Pain and Numbness    HPI Alexandria Hill is a 62 y.o. female who presents the ER for evaluation of left jaw paresthesias that started around 4:00 followed by pressure in her chest feeling like her bra was fitting too tightly.  Denies any diaphoresis nausea or vomiting.  States that she has made some changes to her blood pressure medications recently.  States she was seen on Monday for similar symptoms at least related to the numbness.  Is been ongoing and intermittent for about 10 days.  Denies any headache.  No pain ripping or tearing through to her back.  Denies any personal history of heart disease.  No history of stroke.  She does not smoke.  States that the symptoms are mild    Past Medical History:  Diagnosis Date  . GERD (gastroesophageal reflux disease)   . Hypertension   . IBS (irritable bowel syndrome) 2019  . Mitral valve regurgitation    mild  . Mitral valve regurgitation    mild  . Osteopenia   . Osteopenia   . Overweight (BMI 25.0-29.9) 08/11/2016  . Reflux   . Renal calculi 07/2017   Family History  Problem Relation Age of Onset  . Aneurysm Mother        brain  . Hypertension Mother   . Stroke Mother        possibly mini strokes  . Heart disease Father   . Heart attack Father   . Mental retardation Father   . Cancer Brother   . Diabetes Maternal Aunt   . Hypertension Sister   . Obesity Sister   . Mental illness Sister   . Cancer Maternal Grandmother   . Hypertension Sister   . Mental illness Sister   . Depression Sister   . Hypertension Sister   . Depression Sister   . COPD Neg Hx   . Breast cancer Neg Hx    Past Surgical History:  Procedure Laterality Date  . CESAREAN SECTION  1987  . COLONOSCOPY WITH  PROPOFOL N/A 12/05/2014   Procedure: COLONOSCOPY WITH PROPOFOL;  Surgeon: Lucilla Lame, MD;  Location: [facility] ENDOSCOPY;  Service: Endoscopy;  Laterality: N/A;  . EXTRACORPOREAL SHOCK WAVE LITHOTRIPSY Left 07/14/2017   Procedure: EXTRACORPOREAL SHOCK WAVE LITHOTRIPSY (ESWL);  Surgeon: Abbie Sons, MD;  Location: [facility] ORS;  Service: Urology;  Laterality: Left;   Patient Active Problem List   Diagnosis Date Noted  . IBS (irritable bowel syndrome) 11/14/2017  . Sciatic nerve pain, right 11/14/2017  . Personal history of kidney stones 11/14/2017  . Essential hypertension, benign 08/11/2016  . Overweight (BMI 25.0-29.9) 08/11/2016  . Heartburn 08/11/2016  . Varicella exposure 04/24/2015  . Osteopenia       Prior to Admission medications   Medication Sig Start Date End Date Taking? Authorizing Provider  acetaminophen (TYLENOL) 500 MG tablet Take 500 mg by mouth every 6 (six) hours as needed.    [provider]  aspirin 81 MG tablet Take 81 mg by mouth daily.    [provider]  Calcium Citrate-Vitamin D 200-250 MG-UNIT TABS Take by mouth daily.    [provider]  Glucosamine-Chondroitin (OSTEO BI-FLEX REGULAR STRENGTH PO) Take by mouth daily.     [provider]  lisinopril (PRINIVIL,ZESTRIL) 5 MG tablet Take 1 tablet (5 mg total) by mouth daily. 03/31/18 03/31/19  Merlyn Lot, MD  metoprolol succinate (TOPROL-XL) 50 MG 24 hr tablet Take 1 tablet (50 mg total) by mouth daily. Take with or immediately following a meal. 03/30/18   Lada, Satira Anis, MD  Multiple Vitamin (MULTIVITAMIN) capsule Take 1 capsule by mouth daily.    [provider]  vitamin C (ASCORBIC ACID) 500 MG tablet Take 500 mg by mouth daily.    [provider]    Allergies Patient has no known allergies.    Social History Social History   Tobacco Use  . Smoking status: Former Smoker    Packs/day: 0.50    Years: 10.00    Pack years: 5.00    Types:  Cigarettes    Last attempt to quit: 04/23/1985    Years since quitting: 32.9  . Smokeless tobacco: Never Used  Substance Use Topics  . Alcohol use: Yes    Comment: occasional  . Drug use: No    Review of Systems Patient denies headaches, rhinorrhea, blurry vision, numbness, shortness of breath, chest pain, edema, cough, abdominal pain, nausea, vomiting, diarrhea, dysuria, fevers, rashes or hallucinations unless otherwise stated above in HPI. ____________________________________________   PHYSICAL EXAM:  VITAL SIGNS: Vitals:   03/31/18 2200 03/31/18 2230  BP: (!) 142/85 (!) 148/84  Pulse: 75 71  Resp: 15 17  Temp:    SpO2: 95% 97%    Constitutional: Alert and oriented.  Eyes: Conjunctivae are normal.  Head: Atraumatic. Nose: No congestion/rhinnorhea. Mouth/Throat: Mucous membranes are moist.   Neck: No stridor. Painless ROM.  Cardiovascular: Normal rate, regular rhythm. Grossly normal heart sounds.  Good peripheral circulation. Respiratory: Normal respiratory effort.  No retractions. Lungs CTAB. Gastrointestinal: Soft and nontender. No distention. No abdominal bruits. No CVA tenderness. Musculoskeletal: No lower extremity tenderness nor edema.  No joint effusions. Neurologic: CN- intact.  No facial droop, Normal FNF.  Normal heel to shin.  Sensation intact bilaterally. Normal speech and language. No gross focal neurologic deficits are appreciated. No gait instability. Skin:  Skin is warm, dry and intact. No rash noted. Psychiatric: Mood and affect are normal. Speech and behavior are normal.  ____________________________________________   LABS (all labs ordered are listed, but only abnormal results are displayed)  Results for orders placed or performed during the hospital encounter of 03/31/18 (from the past 24 hour(s))  Basic metabolic panel     Status: Abnormal   Collection Time: 03/31/18  5:28 PM  Result Value Ref Range   Sodium 138 135 - 145 mmol/L   Potassium 3.8  3.5 - 5.1 mmol/L   Chloride 104 98 - 111 mmol/L   CO2 26 22 - 32 mmol/L   Glucose, Bld 128 (H) 70 - 99 mg/dL   BUN 16 8 - 23 mg/dL   Creatinine, Ser 0.77 0.44 - 1.00 mg/dL   Calcium 9.2 8.9 - 10.3 mg/dL   GFR calc non Af Amer >60 >60 mL/min   GFR calc Af Amer >60 >60 mL/min   Anion gap 8 5 - 15  CBC     Status: None   Collection Time: 03/31/18  5:28 PM  Result Value Ref Range   WBC 7.0 4.0 - 10.5 K/uL   RBC 4.72 3.87 - 5.11 MIL/uL   Hemoglobin 14.4 12.0 - 15.0 g/dL   HCT 43.6 36.0 - 46.0 %   MCV 92.4 80.0 - 100.0 fL   MCH 30.5 26.0 -  34.0 pg   MCHC 33.0 30.0 - 36.0 g/dL   RDW 12.7 11.5 - 15.5 %   Platelets 326 150 - 400 K/uL   nRBC 0.0 0.0 - 0.2 %  Troponin I     Status: None   Collection Time: 03/31/18  5:28 PM  Result Value Ref Range   Troponin I <0.03 <0.03 ng/mL  Troponin I     Status: None   Collection Time: 03/31/18  9:09 PM  Result Value Ref Range   Troponin I <0.03 <0.03 ng/mL   ____________________________________________  EKG My review and personal interpretation at Time: 17:18   Indication: htn  Rate: 85  Rhythm: sinus Axis: normal Other: nonsepcific st abn, poor r wave rpogression ____________________________________________  RADIOLOGY  I personally reviewed all radiographic images ordered to evaluate for the above acute complaints and reviewed radiology reports and findings.  These findings were personally discussed with the patient.  Please see medical record for radiology report.  ____________________________________________   PROCEDURES  Procedure(s) performed:  Procedures    Critical Care performed: no ____________________________________________   INITIAL IMPRESSION / ASSESSMENT AND PLAN / ED COURSE  Pertinent labs & imaging results that were available during my care of the patient were reviewed by me and considered in my medical decision making (see chart for details).   DDX: htn, acs, chf, cva, bleed, htn urgency, doubt pe or  disscetion  SMT LOKEY is a 62 y.o. who presents to the ED with his as described above.  Patient well-appearing is noted to be mildly hypertensive.  She is no focal or objective deficits does have some paresthesia in the left jaw but very minor symptomatology and certainly no debilitating deficit.  Given the intermittent nature over the past 10 days certainly not a TPA candidate.  Doubt CVA but given her hypertension will order C TEE to further evaluate.  Patient also describing some chest pressure.  No diaphoresis.  Possible hypertensive urgency.  Will give Norvasc for blood pressure and reassess.  EKG shows no evidence of ischemia.  Initial troponin is negative but will observe on monitor and repeat troponin.  Clinical Course as of Apr 01 2339  Fri Mar 31, 2018  2126 Patient reassessed.  Feels improved now blood pressure is improved.  Denies any chest pain.  Tingling resolving.  CT head does not show any evidence of acute abnormality.   [PR]  2222 Patient's repeat troponin negative.  Symptoms also remained improved.  Discussed option recommendation for hospitalization for further hemodynamic monitoring chest pain rule out the patient also has close follow-up with cardiology.  Does seem to have a large component of blood pressure induced symptoms.  Does not show any signs of CVA do not feel that emergent MRI clinically indicated at this time.  Will have patient start lisinopril.  Have discussed with the patient and available family all diagnostics and treatments performed thus far and all questions were answered to the best of my ability. The patient demonstrates understanding and agreement with plan.    [PR]    Clinical Course User Index [PR] Merlyn Lot, MD     As part of my medical decision making, I reviewed the following data within the [place] notes reviewed and incorporated, Labs reviewed, notes from prior ED visits and [place] Controlled Substance  Database   ____________________________________________   FINAL CLINICAL IMPRESSION(S) / ED DIAGNOSES  Final diagnoses:  Essential hypertension  Atypical chest pain      NEW MEDICATIONS  STARTED DURING THIS VISIT:  Discharge Medication List as of 03/31/2018 10:29 PM    START taking these medications   Details  lisinopril (PRINIVIL,ZESTRIL) 5 MG tablet Take 1 tablet (5 mg total) by mouth daily., Starting Fri 03/31/2018, Until Sat 03/31/2019, Normal         Note:  This document was prepared using Dragon voice recognition software and may include unintentional dictation errors.    Merlyn Lot, MD 03/31/18 [phone]

## 2018-03-31 NOTE — Telephone Encounter (Signed)
Copied from [place] [id number]. Topic: Quick Communication - See Telephone Encounter >> Mar 31, 2018  8:49 AM Blase Mess A wrote: CRM for notification. See Telephone encounter for: 03/31/18.  Patient is requesting referral to be placed to [facility] for Dr. Fletcher Anon at the [facility].

## 2018-03-31 NOTE — ED Triage Notes (Signed)
C/O left facial numbness.  States numbness has been ongoing and intermittent x 10 days, seen through ED on 10/22 for facial numbness.  Arrives today with c/o left facial numbness x 1 hour and chest pain / tightness x 15 minutes.    Patient is AAOx3.  Skin warm and dry.  Speech clear.  Facial movement symmetrical and wnl.  MAE equally and strong.  Gait steady. NAD

## 2018-03-31 NOTE — ED Notes (Signed)
Patient transported to CT 

## 2018-03-31 NOTE — Discharge Instructions (Signed)
Please return immediately should she develop any chest pain or pressure or any worsening numbness or tingling or headaches.  Please take lisinopril first thing in the morning.  As we discussed you can start taking metoprolol 25 mg.  Please return to the ER should you have any additional questions or concerns as we discussed another option is admission to the hospital for blood pressure monitoring and further cardiac work-up.

## 2018-04-01 ENCOUNTER — Encounter: Payer: Self-pay | Admitting: Physician Assistant

## 2018-04-01 NOTE — Progress Notes (Signed)
Cardiology Office Note Date:  04/03/2018  Patient ID:  Alexandria Hill, Alexandria Hill 02-01-1956, MRN [id number] PCP:  Arnetha Courser, MD  Cardiologist:  Former Dr. Yvone Neu patient    Chief Complaint: Chest pain  History of Present Illness: Alexandria Hill is a 62 y.o. female with history of HTN, prior toabcco abuse quitting in 1986 with 5 pack years, GERD and family history of CAD with her father having valve replacement with possible bypass surgery in his 75s and passing with an MI at age 33 who presents for evaluation of chest pain.   Patient was previously evaluated by Dr. Yvone Neu in 08/2016 at the request of Dr. Sanda Klein for evaluation of hypertension and chest pain that had been going on for quite some time of an unknown duration, possibly a few months lasting a few minutes at a time and was randomly occurring. Lexiscan Myoview on 08/19/2016 was negative for significant ischemia, EF 78%, normal wall motion, no EKG changes concerning for ischemia, achieved 7 METs and 89% of MPHR. This was a low risk study. Echo on 08/25/2016 showed an EF of 60-65%, mild LVH, no RWMA, Gr1DD, normal RV cavity size, wall thickness, and systolic function. No significant valvular abnormalities.   Patient was seen in the ED on 03/28/2018 with left jaw numbness/pain. She denied any chest pain or SOB. Work up showed a negative troponin x 1, EKG not acute. BP noted to be 181/89. Outpatient follow up was advised. Patient saw Dr. Sanda Klein on 10/24 in follow up with BP improved to 138/90. Cardiology follow up was recommended. She returned to the ED on 10/25 with left jaw numbness and a chest tightness sensation, "like her bra was fitting to tight." She reported these symptoms had been intermittently present for ~ 10 days. BP noted to be in the 623J systolic. Troponin negative x 2. CXR not acute. CT head not acute. EKG not acute. Patient was started on lisinopril and outpatient follow up was advised.   She comes in accompanied by her daughter  today.  Patient notes a several month history of blood pressure readings that have slowly been trending upwards into the initially 628B to 151V systolic.  In this setting, patient contacted her PCP for follow-up appointment which was scheduled for 10/24 as detailed above.  However, over the past 2 weeks the patient has noted worsening blood pressure into the 616W to 737T systolic.  In the setting, she has had left jaw numbness and pain with associated chest tightness.  Symptoms are not exertional.  Symptoms seem to only occur when her blood pressure is in the 062I to 948N systolic.  Symptoms are similar to how she felt in 08/2016 when she was initially evaluated by cardiology as above.  Since starting lisinopril 5 mg daily at her ER visit on 10/25 her blood pressure has been significantly improved into the 1 teens to 462V systolic.  She has not had any further jaw numbness or pain nor any chest tightness.  She is currently without complaints.   Past Medical History:  Diagnosis Date  . GERD (gastroesophageal reflux disease)   . Hypertension   . IBS (irritable bowel syndrome) 2019  . Osteopenia   . Overweight (BMI 25.0-29.9) 08/11/2016  . Reflux   . Renal calculi 07/2017    Past Surgical History:  Procedure Laterality Date  . CESAREAN SECTION  1987  . COLONOSCOPY WITH PROPOFOL N/A 12/05/2014   Procedure: COLONOSCOPY WITH PROPOFOL;  Surgeon: Lucilla Lame, MD;  [facility]  END[facility];  Service: Endoscopy;  Laterality: N/A;  . EXTRACORPOREAL SHOCK WAVE LITHOTRIPSY Left 07/14/2017   Procedure: EXTRACORPOREAL SHOCK WAVE LITHOTRIPSY (ESWL);  Surgeon: Abbie Sons, MD;  Location: [facility] ORS;  Service: Urology;  Laterality: Left;    Current Meds  Medication Sig  . aspirin 81 MG tablet Take 81 mg by mouth daily.  . Calcium Citrate-Vitamin D 200-250 MG-UNIT TABS Take by mouth daily.  . Multiple Vitamin (MULTIVITAMIN) capsule Take 1 capsule by mouth daily.  . vitamin C (ASCORBIC ACID) 500 MG tablet Take  500 mg by mouth daily.  . [DISCONTINUED] acetaminophen (TYLENOL) 500 MG tablet Take 500 mg by mouth every 6 (six) hours as needed.  . [DISCONTINUED] Glucosamine-Chondroitin (OSTEO BI-FLEX REGULAR STRENGTH PO) Take by mouth daily.   . [DISCONTINUED] lisinopril (PRINIVIL,ZESTRIL) 5 MG tablet Take 1 tablet (5 mg total) by mouth daily.  . [DISCONTINUED] metoprolol succinate (TOPROL-XL) 50 MG 24 hr tablet Take 1 tablet (50 mg total) by mouth daily. Take with or immediately following a meal. (Patient taking differently: Take 25 mg by mouth daily. Take with or immediately following a meal.)    Allergies:   Patient has no known allergies.   Social History:  The patient  reports that she quit smoking about 32 years ago. Her smoking use included cigarettes. She has a 5.00 pack-year smoking history. She has never used smokeless tobacco. She reports that she drinks alcohol. She reports that she does not use drugs.   Family History:  The patient's family history includes Aneurysm in her mother; Cancer in her brother and maternal grandmother; Depression in her sister and sister; Diabetes in her maternal aunt; Heart attack in her father; Heart disease in her father; Hypertension in her mother, sister, sister, and sister; Mental illness in her sister and sister; Mental retardation in her father; Obesity in her sister; Stroke in her mother.  ROS:   Review of Systems  Constitutional: Positive for malaise/fatigue. Negative for chills, diaphoresis, fever and weight loss.  HENT: Negative for congestion.   Eyes: Negative for discharge and redness.  Respiratory: Negative for cough, hemoptysis, sputum production, shortness of breath and wheezing.   Cardiovascular: Positive for chest pain. Negative for palpitations, orthopnea, claudication, leg swelling and PND.  Gastrointestinal: Negative for abdominal pain, blood in stool, heartburn, melena, nausea and vomiting.  Genitourinary: Negative for hematuria.    Musculoskeletal: Positive for neck pain. Negative for falls and myalgias.       Left jaw numbness and pain  Skin: Negative for rash.  Neurological: Positive for sensory change and weakness. Negative for dizziness, tingling, tremors, speech change, focal weakness and loss of consciousness.  Endo/Heme/Allergies: Does not bruise/bleed easily.  Psychiatric/Behavioral: Negative for substance abuse. The patient is not nervous/anxious.   All other systems reviewed and are negative.    PHYSICAL EXAM:  VS:  BP 122/80 (BP Location: Left Arm, Patient Position: Sitting, Cuff Size: Normal)   Pulse 77   Ht 5' 3.5" (1.613 m)   Wt 154 lb 4 oz (70 kg)   BMI 26.90 kg/m  BMI: Body mass index is 26.9 kg/m.  Physical Exam  Constitutional: She is oriented to person, place, and time. She appears well-developed and well-nourished.  HENT:  Head: Normocephalic and atraumatic.  Eyes: Right eye exhibits no discharge. Left eye exhibits no discharge.  Neck: Normal range of motion. No JVD present.  Cardiovascular: Normal rate, regular rhythm, S1 normal, S2 normal and normal heart sounds. Exam reveals no distant heart sounds, no friction  rub, no midsystolic click and no opening snap.  No murmur heard. Pulses:      Posterior tibial pulses are 2+ on the right side, and 2+ on the left side.  Pulmonary/Chest: Effort normal and breath sounds normal. No respiratory distress. She has no decreased breath sounds. She has no wheezes. She has no rales. She exhibits no tenderness.  Abdominal: Soft. She exhibits no distension. There is no tenderness.  Musculoskeletal: She exhibits no edema.  Neurological: She is alert and oriented to person, place, and time.  Skin: Skin is warm and dry. No cyanosis. Nails show no clubbing.  Psychiatric: She has a normal mood and affect. Her speech is normal and behavior is normal. Judgment and thought content normal.     EKG:  Was ordered and interpreted by me today. Shows NSR, 77 bpm,  normal axis, no acute ST-T changes  Recent Labs: 05/02/2017: TSH 1.33 03/28/2018: ALT 24 03/31/2018: BUN 16; Creatinine, Ser 0.77; Hemoglobin 14.4; Platelets 326; Potassium 3.8; Sodium 138  03/30/2018: Cholesterol 191; HDL 55; LDL Cholesterol (Calc) 113; Total CHOL/HDL Ratio 3.5; Triglycerides 120   Estimated Creatinine Clearance: 67.5 mL/min (by C-G formula based on SCr of 0.77 mg/dL).   Wt Readings from Last 3 Encounters:  04/03/18 154 lb 4 oz (70 kg)  03/31/18 154 lb 1.7 oz (69.9 kg)  03/30/18 151 lb 14.4 oz (68.9 kg)     Other studies reviewed: Additional studies/records reviewed today include: summarized above  ASSESSMENT AND PLAN:  1. Chest pain with moderate risk for cardiac etiology.  Currently asystematic.  Long discussion with patient and daughter regarding further cardiac evaluation.  Schedule cardiac CT with FFR to further evaluate for high risk coronary atherosclerosis and for further management of both her symptoms and hyperlipidemia.  2. Hypertension: Significantly improved following initiation of lisinopril.  I do not think patient needs to be on both low-dose metoprolol and low-dose lisinopril.  We have elected to taper her off metoprolol over the next 3 days by taking 12.5 mg daily through 10/31.  In this setting, she will increase lisinopril to 10 mg daily.  On 11/1, she will no longer take Toprol.  She will continue to monitor her blood pressure and call us if systolic readings are persistently greater than 130-140.  Plan for RN visit on 11/4 for BP check and labs to evaluate for renal function since starting lisinopril.  3. Hyperlipidemia: Recent LDL of 113 from 03/30/2018.  Await cardiac evaluation as above prior to deciding whether lifestyle modification is recommended versus initiation of statin.  Patient prefers to avoid statin if possible.  Disposition: F/u with Dr. Fletcher Anon in 2 months (reestablish care, former [facility] patient)  Current medicines are reviewed at  length with the patient today.  The patient did not have any concerns regarding medicines.  Signed, Christell Faith, PA-C 04/03/2018 4:05 PM     [place] [address] [place], [place] [address] [phone]

## 2018-04-03 ENCOUNTER — Encounter: Payer: Self-pay | Admitting: Physician Assistant

## 2018-04-03 ENCOUNTER — Ambulatory Visit: Payer: 59 | Admitting: Physician Assistant

## 2018-04-03 VITALS — BP 122/80 | HR 77 | Ht 63.5 in | Wt 154.2 lb

## 2018-04-03 DIAGNOSIS — I10 Essential (primary) hypertension: Secondary | ICD-10-CM

## 2018-04-03 DIAGNOSIS — R079 Chest pain, unspecified: Secondary | ICD-10-CM

## 2018-04-03 DIAGNOSIS — Z79899 Other long term (current) drug therapy: Secondary | ICD-10-CM

## 2018-04-03 DIAGNOSIS — E782 Mixed hyperlipidemia: Secondary | ICD-10-CM

## 2018-04-03 MED ORDER — METOPROLOL TARTRATE 100 MG PO TABS: ORAL_TABLET | ORAL | 0 refills | Status: DC

## 2018-04-03 MED ORDER — LISINOPRIL 10 MG PO TABS: 10.0000 mg | ORAL_TABLET | Freq: Every day | ORAL | 3 refills | Status: DC

## 2018-04-03 NOTE — Patient Instructions (Addendum)
Medication Instructions:  Your physician has recommended you make the following change in your medication:  1- DECREASE Metoprolol to 12.5 mg by mouth once a day for 3 days, THEN STOP. 2- INCREASE Lisinopril to 10 mg by mouth once a day starting tomorrow.   If you need a refill on your cardiac medications before your next appointment, please call your pharmacy.   Lab work: Your physician recommends that you return for lab work in: [place].  If you have labs (blood work) drawn today and your tests are completely normal, you will receive your results only by: Marland Kitchen MyChart Message (if you have MyChart) OR . A paper copy in the mail If you have any lab test that is abnormal or we need to change your treatment, we will call you to review the results.  Testing/Procedures: Your physician has requested that you have cardiac CT. Cardiac computed tomography (CT) is a painless test that uses an x-ray machine to take clear, detailed pictures of your heart. For further information please visit [email or web address]. Please follow instruction sheet as given.  Please arrive at the [facility] main entrance of [facility] at xx:xx AM (30-45 minutes prior to test start time)  [facility] [place], [place] [address] [phone]  Proceed to the [facility] Radiology Department (First Floor).  Please follow these instructions carefully (unless otherwise directed):   On the Night Before the Test: . Be sure to Drink plenty of water. . Do not consume any caffeinated/decaffeinated beverages or chocolate 12 hours prior to your test. . Do not take any antihistamines 12 hours prior to your test. . If you take Metformin do not take 24 hours prior to test.   On the Day of the Test: . Drink plenty of water. Do not drink any water within one hour of the test. . Do not eat any food 4 hours prior to the test. . You  may take your regular medications prior to the test.  . Take metoprolol (Lopressor) 100 mg by mouth two hours prior to test.   After the Test: . Drink plenty of water. . After receiving IV contrast, you may experience a mild flushed feeling. This is normal. . On occasion, you may experience a mild rash up to 24 hours after the test. This is not dangerous. If this occurs, you can take Benadryl 25 mg and increase your fluid intake. . If you experience trouble breathing, this can be serious. If it is severe call 911 IMMEDIATELY. If it is mild, please call our office.    Follow-Up: Your physician recommends that you schedule a follow-up appointment in: 1 week (Monday) for lab work and BP check.    At [facility], you and your health needs are our priority.  As part of our continuing mission to provide you with exceptional heart care, we have created designated Provider Care Teams.  These Care Teams include your primary Cardiologist (physician) and Advanced Practice Providers (APPs -  Physician Assistants and Nurse Practitioners) who all work together to provide you with the care you need, when you need it. You will need a follow up appointment in 2 months.  Please call our office 2 months in advance to schedule this appointment.  You may see DR Harrell Gave END or one of the following Advanced Practice Providers on your designated Care Team:   Murray Hodgkins, NP Christell Faith,  PA-C . Marrianne Mood, PA-C

## 2018-04-06 ENCOUNTER — Ambulatory Visit: Payer: 59

## 2018-04-09 NOTE — Assessment & Plan Note (Signed)
Elevated pressures lately; see AVS

## 2018-04-10 ENCOUNTER — Telehealth: Payer: Self-pay | Admitting: Physician Assistant

## 2018-04-10 ENCOUNTER — Ambulatory Visit (INDEPENDENT_AMBULATORY_CARE_PROVIDER_SITE_OTHER): Payer: 59 | Admitting: *Deleted

## 2018-04-10 ENCOUNTER — Other Ambulatory Visit (INDEPENDENT_AMBULATORY_CARE_PROVIDER_SITE_OTHER): Payer: 59

## 2018-04-10 VITALS — BP 112/70 | HR 91 | Ht 63.5 in | Wt 151.8 lb

## 2018-04-10 DIAGNOSIS — Z79899 Other long term (current) drug therapy: Secondary | ICD-10-CM | POA: Diagnosis not present

## 2018-04-10 DIAGNOSIS — I10 Essential (primary) hypertension: Secondary | ICD-10-CM | POA: Diagnosis not present

## 2018-04-10 MED ORDER — LISINOPRIL 20 MG PO TABS: 20.0000 mg | ORAL_TABLET | Freq: Every day | ORAL | 3 refills | Status: DC

## 2018-04-10 NOTE — Telephone Encounter (Signed)
Message sent to Mack Guise to see where we are in the process. Patient here for BP and lab work visit.  She verbalized understanding that message has been sent.  She will call back sometime next week if she has not heard anything concerning the scheduling on the CT. She needs it down this month because her new insurance year starts back on December 1st.

## 2018-04-10 NOTE — Progress Notes (Signed)
1.) Reason for visit: BP check and BMET  2.) Name of MD requesting visit: Ryan Dunn  3.) H&P: HTN  4.) ROS related to problem: Patient recently taken off metoprolol and increased the lisinopril to 20 mg on 04/04/18 by Christell Faith, PA-C. Patient states BP's at home have been between 105-120/58-70's. Denies any dizziness, chest pain, shortness of breath. Complains of occasional left face numbness and left arm numbness. States it comes on for short periods of time throughout the day. Its been going on since going to the ED on 03/29/18. Denies having it at this moment.  Today, BP 112/70, HR 91.  5.) Assessment and plan per MD: Discussed with Thurmond Butts who advised for patient to continue current dose of lisinopril at 20 mg daily. Sending chart to him for final review.   Patient verbalized understanding and Rx sent to pharmacy.

## 2018-04-10 NOTE — Telephone Encounter (Signed)
Pt is asking about when her cardiac CT will be scheduled, states she needs it this month due to insurance reasons.

## 2018-04-11 LAB — BASIC METABOLIC PANEL
BUN / CREAT RATIO: 22 (ref 12–28)
BUN: 21 mg/dL (ref 8–27)
CALCIUM: 10 mg/dL (ref 8.7–10.3)
CO2: 24 mmol/L (ref 20–29)
Chloride: 100 mmol/L (ref 96–106)
Creatinine, Ser: 0.97 mg/dL (ref 0.57–1.00)
GFR calc non Af Amer: 62 mL/min/{1.73_m2} (ref >59)
GFR, EST AFRICAN AMERICAN: 71 mL/min/{1.73_m2} (ref >59)
GLUCOSE: 70 mg/dL (ref 65–99)
POTASSIUM: 4.3 mmol/L (ref 3.5–5.2)
Sodium: 139 mmol/L (ref 134–144)

## 2018-04-12 NOTE — Telephone Encounter (Signed)
MyChart message sent to patient on 04/11/18 with update. It has been pre-certed. We are now waiting for scheduling to contact patient.

## 2018-04-25 ENCOUNTER — Ambulatory Visit (HOSPITAL_COMMUNITY)
Admission: RE | Admit: 2018-04-25 | Discharge: 2018-04-25 | Disposition: A | Discharge status: Home / Self Care | Payer: 59 | Source: Ambulatory Visit | Attending: Physician Assistant | Admitting: Physician Assistant

## 2018-04-25 ENCOUNTER — Encounter (HOSPITAL_COMMUNITY): Payer: Self-pay

## 2018-04-25 ENCOUNTER — Ambulatory Visit (HOSPITAL_COMMUNITY): Admission: RE | Admit: 2018-04-25 | Payer: 59 | Source: Ambulatory Visit

## 2018-04-25 DIAGNOSIS — I700 Atherosclerosis of aorta: Secondary | ICD-10-CM | POA: Insufficient documentation

## 2018-04-25 DIAGNOSIS — R079 Chest pain, unspecified: Secondary | ICD-10-CM

## 2018-04-25 MED ORDER — METOPROLOL TARTRATE 5 MG/5ML IV SOLN
INTRAVENOUS | Status: AC
  Administered 2018-04-25: 5 mg via INTRAVENOUS
  Filled 2018-04-25: qty 5

## 2018-04-25 MED ORDER — IOPAMIDOL (ISOVUE-370) INJECTION 76%
100.0000 mL | Freq: Once | INTRAVENOUS | Status: AC | PRN
  Administered 2018-04-25: 100 mL via INTRAVENOUS

## 2018-04-25 MED ORDER — NITROGLYCERIN 0.4 MG SL SUBL
SUBLINGUAL_TABLET | SUBLINGUAL | Status: AC
  Administered 2018-04-25: 0.8 mg via SUBLINGUAL
  Filled 2018-04-25: qty 2

## 2018-04-25 MED ORDER — METOPROLOL TARTRATE 5 MG/5ML IV SOLN
5.0000 mg | INTRAVENOUS | Status: DC | PRN
  Administered 2018-04-25: 5 mg via INTRAVENOUS
  Filled 2018-04-25 (×2): qty 5

## 2018-04-25 MED ORDER — NITROGLYCERIN 0.4 MG SL SUBL
0.8000 mg | SUBLINGUAL_TABLET | Freq: Once | SUBLINGUAL | Status: AC
  Administered 2018-04-25: 0.8 mg via SUBLINGUAL
  Filled 2018-04-25: qty 25

## 2018-04-26 ENCOUNTER — Telehealth: Payer: Self-pay | Admitting: *Deleted

## 2018-04-26 NOTE — Telephone Encounter (Signed)
Pain along the left jaw and neck is not from her heart.  She has normal coronary arteries by recent cardiac CT with a calcium score of 0.  I recommend she follow-up with her PCP for noncardiac left jaw and neck pain.  With regards to her aortic atherosclerosis and LDL of 113, if she does not want to start a statin I recommend she avoid saturated fats including pork and beef.  She should eat grilled or baked chicken, [place], and salmon.  Limit fried foods and increase fruits and vegetables.  Would increase exercise.  Can recheck lipid panel and 6 to 12 months.

## 2018-04-26 NOTE — Telephone Encounter (Signed)
Results called to pt. Pt verbalized understanding. She is hesitant to start lipitor at this time because she really does not want to start on another medication. Would like to know if there are any recommended supplements to help her cholesterol instead of lipitor.  Also, still having sensation around her left jaw line and neck. No pain in her chest, no shortness of breath or nausea/vomiting. Occurs off and on during the day but not every day.  Could last several hours.  She would like to know if there's anything else she needs to do about that.  Routing to [employer] for advice.

## 2018-04-26 NOTE — Telephone Encounter (Signed)
-----   Message from Rise Mu, PA-C sent at 04/25/2018  3:41 PM EST ----- Coronary CT showed a calcium score of 0 with normal coronary arteries and a normal size aortic root.  Extracardiac findings included some aortic atherosclerosis and a stable 4 mm left lower lobe pulmonary nodule when compared to study in 06/2017.   Recommendations: -Primary prevention -Would start Lipitor 40 mg daily if she is agreeable with planned follow up lipid and liver function in ~ 8 weeks. -With regards to the pulmonary nodule, given she is a former smoker she may want to repeat a non-contrast chest CT in 12 months. She should discuss this with her PCP. If she would like to have this done, it can be ordered with [place] or PCP. Just let us know.

## 2018-04-26 NOTE — Telephone Encounter (Signed)
Called patient. She verbalized understanding of recommendations. She will contact PCP. She has appointment coming up in December already scheduled. She will discuss at that time if Dr Sanda Klein will manage cholesterol or if she will keep appointment with Dr Fletcher Anon in January as scheduled. She was very [employer] attorney.

## 2018-05-05 ENCOUNTER — Encounter: Payer: 59 | Admitting: Family Medicine

## 2018-05-09 ENCOUNTER — Encounter: Payer: Self-pay | Admitting: Family Medicine

## 2018-05-09 ENCOUNTER — Encounter: Payer: Self-pay | Admitting: Physician Assistant

## 2018-05-09 ENCOUNTER — Ambulatory Visit (INDEPENDENT_AMBULATORY_CARE_PROVIDER_SITE_OTHER): Payer: 59 | Admitting: Family Medicine

## 2018-05-09 VITALS — BP 122/78 | HR 84 | Temp 98.0°F | Ht 62.5 in | Wt 150.5 lb

## 2018-05-09 DIAGNOSIS — Z1239 Encounter for other screening for malignant neoplasm of breast: Secondary | ICD-10-CM | POA: Diagnosis not present

## 2018-05-09 DIAGNOSIS — Z0000 Encounter for general adult medical examination without abnormal findings: Secondary | ICD-10-CM | POA: Diagnosis not present

## 2018-05-09 DIAGNOSIS — M542 Cervicalgia: Secondary | ICD-10-CM

## 2018-05-09 DIAGNOSIS — R911 Solitary pulmonary nodule: Secondary | ICD-10-CM

## 2018-05-09 NOTE — Progress Notes (Signed)
I called and spoke with the patient. She is aware of Thurmond Butts, PA's recommendations to come in for a nurse visit BP check and to bring her home monitor to calibrate.  The patient states that her low readings are typically at night. I advised as long as she brings her cuff and her readings are fairly close to ours, then those low night time readings would be pretty accurate. We just need to confirm her home BP monitor if correct. The patient is agreeable and voices understanding. I have advised her to take her home medications that morning prior to coming and bring her BP cuff with her from home.

## 2018-05-09 NOTE — Patient Instructions (Addendum)
Please call the cardiology office today about reducing your lisinopril to 15 mg or even 10 mg (I'll defer that to them) Make sure you are getting 1,000 iu of vitamin D3 daily in vitamin and/or sun exposure Consider getting the new shingles vaccine called Shingrix; that is available for individuals 64 years of age and older, and is recommended even if you have had shingles in the past and/or already received the old shingles vaccine (Zostavax); it is a two-part series, and is available at many local pharmacies or here  Try to limit saturated fats in your diet (bologna, hot dogs, barbeque, cheeseburgers, hamburgers, steak, bacon, sausage, cheese, etc.) and get more fresh fruits, vegetables, and whole grains  Health Maintenance, Female Adopting a healthy lifestyle and getting preventive care can go a long way to promote health and wellness. Talk with your health care provider about what schedule of regular examinations is right for you. This is a good chance for you to check in with your provider about disease prevention and staying healthy. In between checkups, there are plenty of things you can do on your own. Experts have done a lot of research about which lifestyle changes and preventive measures are most likely to keep you healthy. Ask your health care provider for more information. Weight and diet Eat a healthy diet  Be sure to include plenty of vegetables, fruits, low-fat dairy products, and lean protein.  Do not eat a lot of foods high in solid fats, added sugars, or salt.  Get regular exercise. This is one of the most important things you can do for your health. ? Most adults should exercise for at least 150 minutes each week. The exercise should increase your heart rate and make you sweat (moderate-intensity exercise). ? Most adults should also do strengthening exercises at least twice a week. This is in addition to the moderate-intensity exercise.  Maintain a healthy weight  Body mass  index (BMI) is a measurement that can be used to identify possible weight problems. It estimates body fat based on height and weight. Your health care provider can help determine your BMI and help you achieve or maintain a healthy weight.  For females 21 years of age and older: ? A BMI below 18.5 is considered underweight. ? A BMI of 18.5 to 24.9 is normal. ? A BMI of 25 to 29.9 is considered overweight. ? A BMI of 30 and above is considered obese.  Watch levels of cholesterol and blood lipids  You should start having your blood tested for lipids and cholesterol at 64 years of age, then have this test every 5 years.  You may need to have your cholesterol levels checked more often if: ? Your lipid or cholesterol levels are high. ? You are older than 64 years of age. ? You are at high risk for heart disease.  Cancer screening Lung Cancer  Lung cancer screening is recommended for adults 15-13 years old who are at high risk for lung cancer because of a history of smoking.  A yearly low-dose CT scan of the lungs is recommended for people who: ? Currently smoke. ? Have quit within the past 15 years. ? Have at least a 30-pack-year history of smoking. A pack year is smoking an average of one pack of cigarettes a day for 1 year.  Yearly screening should continue until it has been 15 years since you quit.  Yearly screening should stop if you develop a health problem that would prevent you from  having lung cancer treatment.  Breast Cancer  Practice breast self-awareness. This means understanding how your breasts normally appear and feel.  It also means doing regular breast self-exams. Let your health care provider know about any changes, no matter how small.  If you are in your 20s or 30s, you should have a clinical breast exam (CBE) by a health care provider every 1-3 years as part of a regular health exam.  If you are 74 or older, have a CBE every year. Also consider having a breast  X-ray (mammogram) every year.  If you have a family history of breast cancer, talk to your health care provider about genetic screening.  If you are at high risk for breast cancer, talk to your health care provider about having an MRI and a mammogram every year.  Breast cancer gene (BRCA) assessment is recommended for women who have family members with BRCA-related cancers. BRCA-related cancers include: ? Breast. ? Ovarian. ? Tubal. ? Peritoneal cancers.  Results of the assessment will determine the need for genetic counseling and BRCA1 and BRCA2 testing.  Cervical Cancer Your health care provider may recommend that you be screened regularly for cancer of the pelvic organs (ovaries, uterus, and vagina). This screening involves a pelvic examination, including checking for microscopic changes to the surface of your cervix (Pap test). You may be encouraged to have this screening done every 3 years, beginning at age 53.  For women ages 71-65, health care providers may recommend pelvic exams and Pap testing every 3 years, or they may recommend the Pap and pelvic exam, combined with testing for human papilloma virus (HPV), every 5 years. Some types of HPV increase your risk of cervical cancer. Testing for HPV may also be done on women of any age with unclear Pap test results.  Other health care providers may not recommend any screening for nonpregnant women who are considered low risk for pelvic cancer and who do not have symptoms. Ask your health care provider if a screening pelvic exam is right for you.  If you have had past treatment for cervical cancer or a condition that could lead to cancer, you need Pap tests and screening for cancer for at least 20 years after your treatment. If Pap tests have been discontinued, your risk factors (such as having a new sexual partner) need to be reassessed to determine if screening should resume. Some women have medical problems that increase the chance of  getting cervical cancer. In these cases, your health care provider may recommend more frequent screening and Pap tests.  Colorectal Cancer  This type of cancer can be detected and often prevented.  Routine colorectal cancer screening usually begins at 64 years of age and continues through 64 years of age.  Your health care provider may recommend screening at an earlier age if you have risk factors for colon cancer.  Your health care provider may also recommend using home test kits to check for hidden blood in the stool.  A small camera at the end of a tube can be used to examine your colon directly (sigmoidoscopy or colonoscopy). This is done to check for the earliest forms of colorectal cancer.  Routine screening usually begins at age 57.  Direct examination of the colon should be repeated every 5-10 years through 64 years of age. However, you may need to be screened more often if early forms of precancerous polyps or small growths are found.  Skin Cancer  Check your skin from head  toe regularly.  Tell your health care provider about any new moles or changes in moles, especially if there is a change in a mole's shape or color.  Also tell your health care provider if you have a mole that is larger than the size of a pencil eraser.  Always use sunscreen. Apply sunscreen liberally and repeatedly throughout the day.  Protect yourself by wearing long sleeves, pants, a wide-brimmed hat, and sunglasses whenever you are outside.  Heart disease, diabetes, and high blood pressure  High blood pressure causes heart disease and increases the risk of stroke. High blood pressure is more likely to develop in: ? People who have blood pressure in the high end of the normal range (130-139/85-89 mm Hg). ? People who are overweight or obese. ? People who are African American.  If you are 18-39 years of age, have your blood pressure checked every 3-5 years. If you are 40 years of age or older,  have your blood pressure checked every year. You should have your blood pressure measured twice-once when you are at a hospital or clinic, and once when you are not at a hospital or clinic. Record the average of the two measurements. To check your blood pressure when you are not at a hospital or clinic, you can use: ? An automated blood pressure machine at a pharmacy. ? A home blood pressure monitor.  If you are between 55 years and 79 years old, ask your health care provider if you should take aspirin to prevent strokes.  Have regular diabetes screenings. This involves taking a blood sample to check your fasting blood sugar level. ? If you are at a normal weight and have a low risk for diabetes, have this test once every three years after 64 years of age. ? If you are overweight and have a high risk for diabetes, consider being tested at a younger age or more often. Preventing infection Hepatitis B  If you have a higher risk for hepatitis B, you should be screened for this virus. You are considered at high risk for hepatitis B if: ? You were born in a country where hepatitis B is common. Ask your health care provider which countries are considered high risk. ? Your parents were born in a high-risk country, and you have not been immunized against hepatitis B (hepatitis B vaccine). ? You have HIV or AIDS. ? You use needles to inject street drugs. ? You live with someone who has hepatitis B. ? You have had sex with someone who has hepatitis B. ? You get hemodialysis treatment. ? You take certain medicines for conditions, including cancer, organ transplantation, and autoimmune conditions.  Hepatitis C  Blood testing is recommended for: ? Everyone born from 1945 through 1965. ? Anyone with known risk factors for hepatitis C.  Sexually transmitted infections (STIs)  You should be screened for sexually transmitted infections (STIs) including gonorrhea and chlamydia if: ? You are sexually  active and are younger than 64 years of age. ? You are older than 64 years of age and your health care provider tells you that you are at risk for this type of infection. ? Your sexual activity has changed since you were last screened and you are at an increased risk for chlamydia or gonorrhea. Ask your health care provider if you are at risk.  If you do not have HIV, but are at risk, it may be recommended that you take a prescription medicine daily to prevent HIV infection.   infection. This is called pre-exposure prophylaxis (PrEP). You are considered at risk if: ? You are sexually active and do not regularly use condoms or know the HIV status of your partner(s). ? You take drugs by injection. ? You are sexually active with a partner who has HIV.  Talk with your health care provider about whether you are at high risk of being infected with HIV. If you choose to begin PrEP, you should first be tested for HIV. You should then be tested every 3 months for as long as you are taking PrEP. Pregnancy  If you are premenopausal and you may become pregnant, ask your health care provider about preconception counseling.  If you may become pregnant, take 400 to 800 micrograms (mcg) of folic acid every day.  If you want to prevent pregnancy, talk to your health care provider about birth control (contraception). Osteoporosis and menopause  Osteoporosis is a disease in which the bones lose minerals and strength with aging. This can result in serious bone fractures. Your risk for osteoporosis can be identified using a bone density scan.  If you are 56 years of age or older, or if you are at risk for osteoporosis and fractures, ask your health care provider if you should be screened.  Ask your health care provider whether you should take a calcium or vitamin D supplement to lower your risk for osteoporosis.  Menopause may have certain physical symptoms and risks.  Hormone replacement therapy may reduce some of these  symptoms and risks. Talk to your health care provider about whether hormone replacement therapy is right for you. Follow these instructions at home:  Schedule regular health, dental, and eye exams.  Stay current with your immunizations.  Do not use any tobacco products including cigarettes, chewing tobacco, or electronic cigarettes.  If you are pregnant, do not drink alcohol.  If you are breastfeeding, limit how much and how often you drink alcohol.  Limit alcohol intake to no more than 1 drink per day for nonpregnant women. One drink equals 12 ounces of beer, 5 ounces of wine, or 1 ounces of hard liquor.  Do not use street drugs.  Do not share needles.  Ask your health care provider for help if you need support or information about quitting drugs.  Tell your health care provider if you often feel depressed.  Tell your health care provider if you have ever been abused or do not feel safe at home. This information is not intended to replace advice given to you by your health care provider. Make sure you discuss any questions you have with your health care provider. Document Released: 12/07/2010 Document Revised: 10/30/2015 Document Reviewed: 02/25/2015 Elsevier Interactive Patient Education  [employer].

## 2018-05-09 NOTE — Progress Notes (Signed)
Received message from patient's PCP today indicating the patient reported her blood pressure readings at home have been in the 90s over 50s.  Patient was seen in the PCPs office today.  Patient's PCP requested that cardiology consider decreasing the patient's lisinopril.  Upon reviewing the patient's blood pressure readings at our office as well as PCP's office blood pressure has been running in the 120s over 70s.  I feel like if this was an adjustment the PCP felt needed to be made, the adjustment should have been made while the patient was in their office.  We will have our office contact the patient for BP readings as well as to have the patient bring in her BP cuff for comparison to ours and make further recommendations based off that information.  Please contact patient and have the patient provide recent BP readings.  Please schedule patient for nurse visit to compare her BP cuff with ours.  Further recommendations pending.

## 2018-05-09 NOTE — Assessment & Plan Note (Signed)
USPSTF grade A and B recommendations reviewed with patient; age-appropriate recommendations, preventive care, screening tests, etc discussed and encouraged; healthy living encouraged; see AVS for patient education given to patient  

## 2018-05-09 NOTE — Progress Notes (Signed)
Greetings. It was a pleasure to see you today. Your complete blood count is normal. You have normal numbers of white blood cells, red blood cells, and platelets. The other labs are pending. Peace, Dr. Lochlann Mastrangelo

## 2018-05-09 NOTE — Assessment & Plan Note (Signed)
Due for follow-up chest CT in November 2020

## 2018-05-09 NOTE — Progress Notes (Signed)
BP 122/78   Pulse 84   Temp 98 F (36.7 C) (Oral)   Ht 5' 2.5" (1.588 m)   Wt 150 lb 8 oz (68.3 kg)   SpO2 98%   BMI 27.09 kg/m    Subjective:    Patient ID: Alexandria Hill, female    DOB: [birth year], 62 y.o.   MRN: [id number]  HPI: Alexandria Hill is a 62 y.o. female  Chief Complaint  Patient presents with  . Annual Exam    HPI   She is here for a physical However, she mentioned that she is still having left sided neck pain; went to the ER; worked her up for coronary artery disease; seeing cards; reviewed lipid panel, reviewed aspirin therapy; she was found to have a solitary lung nodule on recent CT scan  USPSTF grade A and B recommendations Depression:  Depression screen [facility] 2/9 05/09/2018 03/30/2018 05/02/2017 10/08/2016 08/11/2016  Decreased Interest 0 0 0 0 0  Down, Depressed, Hopeless 0 0 0 0 0  PHQ - 2 Score 0 0 0 0 0  Altered sleeping 0 0 - - -  Tired, decreased energy 0 0 - - -  Change in appetite 0 0 - - -  Feeling bad or failure about yourself  0 0 - - -  Trouble concentrating 0 0 - - -  Moving slowly or fidgety/restless 0 0 - - -  Suicidal thoughts 0 0 - - -  PHQ-9 Score 0 0 - - -  Difficult doing work/chores Not difficult at all Not difficult at all - - -   Hypertension: well controlled; 90/50s to 60s in the evening; note sent to cardiologists  BP Readings from Last 3 Encounters:  05/09/18 122/78  04/25/18 115/67  04/10/18 112/70   Obesity: overweight; trying to eat better  Wt Readings from Last 3 Encounters:  05/09/18 150 lb 8 oz (68.3 kg)  04/10/18 151 lb 12 oz (68.8 kg)  04/03/18 154 lb 4 oz (70 kg)   BMI Readings from Last 3 Encounters:  05/09/18 27.09 kg/m  04/10/18 26.46 kg/m  04/03/18 26.90 kg/m     Skin cancer: goes to dermatologist regularly Lung cancer:  Former smoker; they have seen a spot on one lobe Breast cancer: last mammo Jan 2019 Colorectal cancer:  2016 done; due in 2021; hx of polyps, positive fam hx of colon  cancer Cervical cancer screening: due in 2021 BRCA gene screening: family hx of breast and/or ovarian cancer and/or metastatic prostate cancer? Not interested HIV, hep B, hep C: no new exposure STD testing and prevention (chl/gon/syphilis): no desire to test Intimate partner violence: no abuse Contraception: n/a Osteoporosis: done in 2019, due in 2021 Fall prevention/vitamin D: discussed Immunizations: flu shot UTD; considering shingles vaccine Diet: trying to eat better; enough calcium in the diet; trying to limit red meat; more fish, tried ground [place] and chicken Exercise: regular exercise, walks 15-30 minutes almost every day of the week Alcohol:    [facility] Visit from 05/09/2018 in [facility]  AUDIT-C Score  1     Tobacco use: quit; over 30 years ago AAA: n/a; no AAA in the family Aspirin: The 10-year ASCVD risk score Mikey Bussing DC Jr., et al., 2013) is: 6%   Values used to calculate the score:     Age: 62 years     Sex: Female     Is Non-Hispanic African American: No     Diabetic: No     Tobacco  smoker: No     Systolic Blood Pressure: 161 mmHg     Is BP treated: Yes     HDL Cholesterol: 55 mg/dL     Total Cholesterol: 191 mg/dL Glucose:  Glucose  Date Value Ref Range Status  04/10/2018 70 65 - 99 mg/dL Final   Glucose, Bld  Date Value Ref Range Status  03/31/2018 128 (H) 70 - 99 mg/dL Final  03/28/2018 113 (H) 70 - 99 mg/dL Final  11/14/2017 95 65 - 139 mg/dL Final    Comment:    .        Non-fasting reference interval .    Lipids:  Lab Results  Component Value Date   CHOL 191 03/30/2018   CHOL 181 05/02/2017   CHOL 171 04/26/2016   Lab Results  Component Value Date   HDL 55 03/30/2018   HDL 54 05/02/2017   HDL 63 04/26/2016   Lab Results  Component Value Date   LDLCALC 113 (H) 03/30/2018   LDLCALC 108 (H) 05/02/2017   LDLCALC 91 04/26/2016   Lab Results  Component Value Date   TRIG 120 03/30/2018   TRIG 101 05/02/2017   TRIG  84 04/26/2016   Lab Results  Component Value Date   CHOLHDL 3.5 03/30/2018   CHOLHDL 3.4 05/02/2017   CHOLHDL 2.7 04/26/2016   No results found for: LDLDIRECT   Depression screen [facility] 2/9 05/09/2018 03/30/2018 05/02/2017 10/08/2016 08/11/2016  Decreased Interest 0 0 0 0 0  Down, Depressed, Hopeless 0 0 0 0 0  PHQ - 2 Score 0 0 0 0 0  Altered sleeping 0 0 - - -  Tired, decreased energy 0 0 - - -  Change in appetite 0 0 - - -  Feeling bad or failure about yourself  0 0 - - -  Trouble concentrating 0 0 - - -  Moving slowly or fidgety/restless 0 0 - - -  Suicidal thoughts 0 0 - - -  PHQ-9 Score 0 0 - - -  Difficult doing work/chores Not difficult at all Not difficult at all - - -   Fall Risk  05/09/2018 03/30/2018 05/02/2017 10/08/2016 08/11/2016  Falls in the past year? 0 No No No No  Number falls in past yr: 0 - - - -    Relevant past medical, surgical, family and social history reviewed Past Medical History:  Diagnosis Date  . GERD (gastroesophageal reflux disease)   . Hypertension   . IBS (irritable bowel syndrome) 2019  . Osteopenia   . Overweight (BMI 25.0-29.9) 08/11/2016  . Reflux   . Renal calculi 07/2017   Past Surgical History:  Procedure Laterality Date  . CESAREAN SECTION  1987  . COLONOSCOPY WITH PROPOFOL N/A 12/05/2014   Procedure: COLONOSCOPY WITH PROPOFOL;  Surgeon: Lucilla Lame, MD;  Location: [facility] ENDOSCOPY;  Service: Endoscopy;  Laterality: N/A;  . EXTRACORPOREAL SHOCK WAVE LITHOTRIPSY Left 07/14/2017   Procedure: EXTRACORPOREAL SHOCK WAVE LITHOTRIPSY (ESWL);  Surgeon: Abbie Sons, MD;  Location: [facility] ORS;  Service: Urology;  Laterality: Left;   Family History  Problem Relation Age of Onset  . Aneurysm Mother        brain  . Hypertension Mother   . Stroke Mother        possibly mini strokes  . Heart disease Father   . Heart attack Father   . Mental retardation Father   . Cancer Brother   . Diabetes Maternal Aunt   . Hypertension Sister   .  Obesity  Sister   . Mental illness Sister   . Cancer Maternal Grandmother   . Hypertension Sister   . Mental illness Sister   . Depression Sister   . Hypertension Sister   . Depression Sister   . COPD Neg Hx   . Breast cancer Neg Hx    Social History   Tobacco Use  . Smoking status: Former Smoker    Packs/day: 0.50    Years: 10.00    Pack years: 5.00    Types: Cigarettes    Last attempt to quit: 04/23/1985    Years since quitting: 33.0  . Smokeless tobacco: Never Used  Substance Use Topics  . Alcohol use: Yes    Comment: occasional  . Drug use: No     Office Visit from 05/09/2018 in [facility]  AUDIT-C Score  1      Interim medical history since last visit reviewed. Allergies and medications reviewed  Review of Systems  Constitutional: Negative for unexpected weight change.  HENT: Negative for hearing loss.   Eyes: Negative for visual disturbance.       Annual eye exam  Respiratory: Negative for shortness of breath and wheezing.   Cardiovascular: Negative for chest pain and leg swelling.  Gastrointestinal: Negative for blood in stool.  Endocrine: Negative for polydipsia and polyuria.  Genitourinary: Negative for hematuria.  Skin:       No worrisome moles; sees derm  Neurological: Negative for tremors.  Hematological: Bruises/bleeds easily (bruises easily, no bleeding).   Per HPI unless specifically indicated above     Objective:    BP 122/78   Pulse 84   Temp 98 F (36.7 C) (Oral)   Ht 5' 2.5" (1.588 m)   Wt 150 lb 8 oz (68.3 kg)   SpO2 98%   BMI 27.09 kg/m   Wt Readings from Last 3 Encounters:  05/09/18 150 lb 8 oz (68.3 kg)  04/10/18 151 lb 12 oz (68.8 kg)  04/03/18 154 lb 4 oz (70 kg)    Physical Exam  Constitutional: She appears well-developed and well-nourished.  HENT:  Head: Normocephalic and atraumatic.  Right Ear: Hearing, tympanic membrane, external ear and ear canal normal.  Left Ear: Hearing, tympanic membrane, external  ear and ear canal normal.  Eyes: Conjunctivae and EOM are normal. Right eye exhibits no hordeolum. Left eye exhibits no hordeolum. No scleral icterus.  Neck: Carotid bruit is not present. No thyromegaly present.  Cardiovascular: Normal rate, regular rhythm, S1 normal, S2 normal and normal heart sounds.  No extrasystoles are present.  Pulmonary/Chest: Effort normal and breath sounds normal. No respiratory distress. Right breast exhibits no inverted nipple, no mass, no nipple discharge, no skin change and no tenderness. Left breast exhibits no inverted nipple, no mass, no nipple discharge, no skin change and no tenderness. Breasts are symmetrical.  Abdominal: Soft. Normal appearance and bowel sounds are normal. She exhibits no distension, no abdominal bruit, no pulsatile midline mass and no mass. There is no hepatosplenomegaly. There is no tenderness. No hernia.  Musculoskeletal: Normal range of motion. She exhibits no edema.  Lymphadenopathy:       Head (right side): No submandibular adenopathy present.       Head (left side): No submandibular adenopathy present.    She has no cervical adenopathy.    She has no axillary adenopathy.  Neurological: She is alert. She displays no tremor. No cranial nerve deficit. She exhibits normal muscle tone. Gait normal.  Reflex Scores:  Patellar reflexes are 2+ on the right side and 2+ on the left side. Skin: Skin is warm and dry. No bruising and no ecchymosis noted. No cyanosis. No pallor.  Psychiatric: Her speech is normal and behavior is normal. Thought content normal. Her mood appears not anxious. She does not exhibit a depressed mood.    Results for orders placed or performed in visit on 96/29/52  Basic metabolic panel  Result Value Ref Range   Glucose 70 65 - 99 mg/dL   BUN 21 8 - 27 mg/dL   Creatinine, Ser 0.97 0.57 - 1.00 mg/dL   GFR calc non Af Amer 62 >59 mL/min/1.73   GFR calc Af Amer 71 >59 mL/min/1.73   BUN/Creatinine Ratio 22 12 - 28    Sodium 139 134 - 144 mmol/L   Potassium 4.3 3.5 - 5.2 mmol/L   Chloride 100 96 - 106 mmol/L   CO2 24 20 - 29 mmol/L   Calcium 10.0 8.7 - 10.3 mg/dL      Assessment & Plan:   Problem List Items Addressed This Visit      Other   Preventative health care - Primary    USPSTF grade A and B recommendations reviewed with patient; age-appropriate recommendations, preventive care, screening tests, etc discussed and encouraged; healthy living encouraged; see AVS for patient education given to patient       Relevant Orders   CBC with Differential/Platelet   COMPLETE METABOLIC PANEL WITH GFR   Lipid panel   TSH   Lung nodule, solitary    Due for follow-up chest CT in November 2020       Other Visit Diagnoses    Anterior neck pain       Relevant Orders   Ambulatory referral to Physical Therapy   Screening for breast cancer       Relevant Orders   MM 3D SCREEN BREAST BILATERAL       Follow up plan: Return in about 1 year (around 05/10/2019) for complete physical.  An after-visit summary was printed and given to the patient at [facility].  Please see the patient instructions which may contain other information and recommendations beyond what is mentioned above in the assessment and plan.  No orders of the defined types were placed in this encounter.   Orders Placed This Encounter  Procedures  . MM 3D SCREEN BREAST BILATERAL  . CBC with Differential/Platelet  . COMPLETE METABOLIC PANEL WITH GFR  . Lipid panel  . TSH  . Ambulatory referral to Physical Therapy

## 2018-05-10 LAB — COMPLETE METABOLIC PANEL WITH GFR
AG Ratio: 1.7 (calc) (ref 1.0–2.5)
ALKALINE PHOSPHATASE (APISO): 67 U/L (ref 33–130)
ALT: 16 U/L (ref 6–29)
AST: 17 U/L (ref 10–35)
Albumin: 4.3 g/dL (ref 3.6–5.1)
BUN: 17 mg/dL (ref 7–25)
CO2: 28 mmol/L (ref 20–32)
Calcium: 9.5 mg/dL (ref 8.6–10.4)
Chloride: 106 mmol/L (ref 98–110)
Creat: 0.79 mg/dL (ref 0.50–0.99)
GFR, Est African American: 92 mL/min/{1.73_m2} (ref >=60)
GFR, Est Non African American: 79 mL/min/{1.73_m2} (ref >=60)
GLOBULIN: 2.6 g/dL (ref 1.9–3.7)
Glucose, Bld: 93 mg/dL (ref 65–99)
POTASSIUM: 4.4 mmol/L (ref 3.5–5.3)
SODIUM: 139 mmol/L (ref 135–146)
Total Bilirubin: 0.4 mg/dL (ref 0.2–1.2)
Total Protein: 6.9 g/dL (ref 6.1–8.1)

## 2018-05-10 LAB — CBC WITH DIFFERENTIAL/PLATELET
BASOS ABS: 38 {cells}/uL (ref 0–200)
Basophils Relative: 0.8 %
EOS ABS: 80 {cells}/uL (ref 15–500)
Eosinophils Relative: 1.7 %
HEMATOCRIT: 40.1 % (ref 35.0–45.0)
Hemoglobin: 13.7 g/dL (ref 11.7–15.5)
Lymphs Abs: 1344 cells/uL (ref 850–3900)
MCH: 31.1 pg (ref 27.0–33.0)
MCHC: 34.2 g/dL (ref 32.0–36.0)
MCV: 91.1 fL (ref 80.0–100.0)
MONOS PCT: 8.5 %
MPV: 9.2 fL (ref 7.5–12.5)
Neutro Abs: 2839 cells/uL (ref 1500–7800)
Neutrophils Relative %: 60.4 %
PLATELETS: 321 10*3/uL (ref 140–400)
RBC: 4.4 10*6/uL (ref 3.80–5.10)
RDW: 12.4 % (ref 11.0–15.0)
TOTAL LYMPHOCYTE: 28.6 %
WBC mixed population: 400 cells/uL (ref 200–950)
WBC: 4.7 10*3/uL (ref 3.8–10.8)

## 2018-05-10 LAB — LIPID PANEL
CHOLESTEROL: 194 mg/dL (ref <200)
HDL: 54 mg/dL (ref >50)
LDL Cholesterol (Calc): 121 mg/dL (calc) — ABNORMAL HIGH
Non-HDL Cholesterol (Calc): 140 mg/dL (calc) — ABNORMAL HIGH (ref <130)
TRIGLYCERIDES: 87 mg/dL (ref <150)
Total CHOL/HDL Ratio: 3.6 (calc) (ref <5.0)

## 2018-05-10 LAB — TSH: TSH: 0.92 m[IU]/L (ref 0.40–4.50)

## 2018-05-12 ENCOUNTER — Telehealth: Payer: Self-pay

## 2018-05-12 ENCOUNTER — Ambulatory Visit (INDEPENDENT_AMBULATORY_CARE_PROVIDER_SITE_OTHER): Payer: 59

## 2018-05-12 VITALS — BP 112/74 | HR 84 | Ht 62.0 in | Wt 152.8 lb

## 2018-05-12 DIAGNOSIS — Z0130 Encounter for examination of blood pressure without abnormal findings: Secondary | ICD-10-CM

## 2018-05-12 MED ORDER — LISINOPRIL 10 MG PO TABS: ORAL_TABLET | ORAL | 3 refills | Status: DC

## 2018-05-12 NOTE — Progress Notes (Signed)
I assume she is taking her lisinopril in the morning. If so, she can decrease this to 10 mg daily and keep an eye on her blood pressures. Let us know if she is having readings consistently in the 834M systolic with this decrease.

## 2018-05-12 NOTE — Telephone Encounter (Signed)
Had nursing visit this morning with myself. Alexandria Hill wanted to make sure that her auto cuff was correlating with an accurate pressure. All values were sent to [facility]. His suggestions were as follows, "I assume she is taking her lisinopril in the morning. If so, she can decrease this to 10 mg daily and keep an eye on her blood pressures. Let us know if she is having readings consistently in the 086P systolic with this decrease."  Call to patient. She confirmed that she takes her lisinopril in the am.  She agreed with the plan to decrease dose to 10 mg daily and keep an eye on her blood pressures. She reported that she would take them twice daily in seated position and will keep recording of values.    Med change made in Epic.  She is aware of her f/u in January. Advised pt to call for any further questions or concerns

## 2018-05-12 NOTE — Patient Instructions (Addendum)
Medication Instructions:  Your physician recommends that you continue on your current medications as directed. Please refer to the Current Medication list given to you today.  If you need a refill on your cardiac medications before your next appointment, please call your pharmacy.   Lab work: None ordered   If you have labs (blood work) drawn today and your tests are completely normal, you will receive your results only by: Marland Kitchen MyChart Message (if you have MyChart) OR . A paper copy in the mail If you have any lab test that is abnormal or we need to change your treatment, we will call you to review the results.  Testing/Procedures: None ordered   Follow-Up: 07/04/2018 @ 4:20 PM with Dr. Fletcher Anon   At [facility], you and your health needs are our priority.  As part of our continuing mission to provide you with exceptional heart care, we have created designated Provider Care Teams.  These Care Teams include your primary Cardiologist (physician) and Advanced Practice Providers (APPs -  Physician Assistants and Nurse Practitioners) who all work together to provide you with the care you need, when you need it.

## 2018-05-12 NOTE — Progress Notes (Signed)
1.) Reason for visit: pt c/o "lightheadedness" with standing at times, denies syncope. Takes nightly BP with automated machine, while sitting in bed with legs up in bed. Readings have been in the 90's over 50's.    2.) Name of MD requesting visit: Christell Faith, PA  3.) H&P: pt w hx of HTN, IBS, and GERD  4.) ROS related to problem: pt c/o "lightheadedness" with standing at times, denies syncope.  She denies chest pain or SOB.  I took her manual BP in right arm with a reading of 112/74 while sitting in chair. She brought in her automated BP cuff in this morning and pt placed on right arm, as she does at home. Placement was appropriate. She took 3 readings 5 mins appart in right arm. The readings are as followed;  124/81  134/77  117/74  We also used the clinic's automated BP machine 5 min later in the  same position on R arm. The reading was 112/75.  5.) Assessment and plan per MD: I encouraged pt to keep her appt with Dr. Fletcher Anon on 07/04/2018. In the meantime she will continue to take BP readings nightly while in a seated position with feet flat on floor. Pt encouraged to call if she has more dizziness, any other complications or questions.   Note routed to Christell Faith, [place]

## 2018-06-13 ENCOUNTER — Encounter: Payer: Self-pay | Admitting: Physical Therapy

## 2018-06-13 ENCOUNTER — Ambulatory Visit: Payer: Commercial Managed Care - HMO | Attending: Family Medicine | Admitting: Physical Therapy

## 2018-06-13 DIAGNOSIS — M542 Cervicalgia: Secondary | ICD-10-CM | POA: Diagnosis not present

## 2018-06-13 NOTE — Therapy (Addendum)
[facility] [address], [place], [address] Phone: [phone]   Fax:  [phone]  Physical Therapy Evaluation  Patient Details  Name: Alexandria Hill MRN: [id number] Date of Birth: 1956/03/18 Referring Provider (PT): Lada   Encounter Date: 06/13/2018  PT End of Session - 06/13/18 7322    Visit Number  1    Number of Visits  17    Date for PT Re-Evaluation  08/08/18    PT Start Time  0445    PT Stop Time  0545    PT Time Calculation (min)  60 min    Activity Tolerance  Patient tolerated treatment well    Behavior During Therapy  [facility] for tasks assessed/performed       Past Medical History:  Diagnosis Date  . GERD (gastroesophageal reflux disease)   . Hypertension   . IBS (irritable bowel syndrome) 2019  . Osteopenia   . Overweight (BMI 25.0-29.9) 08/11/2016  . Reflux   . Renal calculi 07/2017    Past Surgical History:  Procedure Laterality Date  . CESAREAN SECTION  1987  . COLONOSCOPY WITH PROPOFOL N/A 12/05/2014   Procedure: COLONOSCOPY WITH PROPOFOL;  Surgeon: Lucilla Lame, MD;  Location: [facility] ENDOSCOPY;  Service: Endoscopy;  Laterality: N/A;  . EXTRACORPOREAL SHOCK WAVE LITHOTRIPSY Left 07/14/2017   Procedure: EXTRACORPOREAL SHOCK WAVE LITHOTRIPSY (ESWL);  Surgeon: Abbie Sons, MD;  Location: [facility] ORS;  Service: Urology;  Laterality: Left;    There were no vitals filed for this visit.  Subjective Assessment - 06/13/18 1649    Pertinent History  Patient is a 63 year female reporting to PT with post neck pain since Nov, with insideous onset that has been worsening since. Patient reports over Christmas when she had a break from the office her pain slightly improved. 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 Patient reports increased pain on L post neck that runs up to her jaw. Reports R neck pain is more along UT. Does report some radiating  pain "between shoulder blades" Worst pain over the past week 4/10 worst 0/10. Pt denies N/V, unexplained weight fluctuation, B&B changes, saddle paresthesia, fever, night sweats, or unrelenting night pain at this time.    Limitations  Lifting;House hold activities    How long can you sit comfortably?  unlimited    How long can you stand comfortably?  unlimited    How long can you walk comfortably?  unlimited    Diagnostic tests  None     Patient Stated Goals  Decrease pain    Currently in Pain?  Yes    Pain Score  2     Pain Location  Neck    Pain Orientation  Posterior    Pain Descriptors / Indicators  Nagging;Dull;Sharp    Pain Type  Chronic pain    Pain Radiating Towards  L up to jaw, R down into shoulder/scapular border    Pain Onset  1 to 4 weeks ago    Pain Frequency  Intermittent    Aggravating Factors   Sitting at computer, waking up in the am    Pain Relieving Factors  medication    Effect of Pain on Daily Activities  Unable to complete work duties without pain          [facility] PT Assessment - 06/13/18 0001      Assessment   Medical Diagnosis  Cervical pain  Referring Provider (PT)  Lada    Onset Date/Surgical Date  04/13/18    Hand Dominance  Right    Next MD Visit  none    Prior Therapy  no      Balance Screen   Has the patient fallen in the past 6 months  No    Has the patient had a decrease in activity level because of a fear of falling?   No    Is the patient reluctant to leave their home because of a fear of falling?   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  Recent memory is intact.  Remote memory is intact.  Attention span and concentration are intact.  Expressive speech is intact.  Patient's fund of knowledge is within normal limits for educational level.  SENSATION: Grossly intact to light touch bilateral UE as determined by testing dermatomes C2-T2 Proprioception and hot/cold testing deferred on this date   MUSCULOSKELETAL: Tremor: None Bulk: Normal Tone: Normal  Posture Rounded shoulders forward head  Gait Gait assessment deferred on this date    Palpation  Pt with TTP at bilat UT with noted  Increased tension at this area with grimace response to palpation. Patient with TTP with noted tension at bilat cervical paraspinals, and L SCM and masseter/pteryagoid (verbalizes TTP)   Strength R/L 5/5 Shoulder flexion (anterior deltoid/pec major/coracobrachialis, axillary n. (C5/6) and musculocutaneous n. (C5-7)) 5/5 Shoulder abduction (deltoid/supraspinatus, axillary/suprascapular n, C5) 5/5 Shoulder external rotation (infraspinatus/teres minor) 5/5 Shoulder internal rotation (subcapularis/lats/pec major) 5/5 Shoulder extension (posterior deltoid, lats, teres major, axillary/thoracodorsal n.) 5/5 Elbow flexion (biceps brachii, brachialis, brachioradialis, musculoskeletal n, C5/6) 5/5 Elbow extension (triceps, radial n, C7) 5/5 Wrist Extension (C6/7) 5/5 Wrist Flexion (C6/7) 5/5 Finger adduction (interossei, ulnar n, T1) Cervical isometrics are strong in all directions; with slight pain with L lateral bending  AROM R/L 50 Cervical Flexion 80 Cervical Extension 32/34 Cervical Lateral Flexion 55/42 Cervical Rotation All shoulder AROM wnl  *Indicates pain, overpressure performed unless otherwise  indicated  PROM All passive cervical ROM wnl with slight "pulling sensation" with end range lateral bending b/l  Repeated Movements No centralization or peripheralization of symptoms with repeated cervical protraction and retraction.     Passive Accessory Intervertebral Motion (PAIVM) Pt denies reproduction of neck pain with CPA C2-T7 and UPA bilaterally C1-T7. Generally hypomobile throughout  Passive Physiological Intervertebral Motion (PPIVM) Normal flexion and extension with PPIVM testing  SPECIAL TESTS Spurlings Negative Spurlings B Negative Distraction Test: negative  Hoffman Sign (cervical cord compression): negative bilat ULTT Median: negative bilat ULTT Ulnar: negative bilat ULTT Radial: negative bilat   Ther-Ex - SCM stretch 30sec  hold (HEP) - UT stretch 30sec hold (HEP) - Doorway pec stretch 30sec hold (HEP) - Scapular retractions seated x20 (HEP) with cuing initially for proper contraction without shoulder hiking with good carry over following. Education on neutral posture and the importance of this to restore proper length-tension relationship, and goal of PT to restore this by relieving ant tension and increasing posture/peri scapular muscle activation                                   PT Education - 06/13/18 1752    Education Details  Patient was educated on diagnosis, anatomy and pathology involved, prognosis, role of PT, and was given an HEP, demonstrating exercise with proper form following verbal and tactile cues, and was given a paper hand out to continue exercise at home. Pt was educated on and agreed to plan of care.    Person(s) Educated  Patient    Methods  Explanation;Demonstration;Tactile cues;Verbal cues;Handout    Comprehension  Verbalized understanding;Verbal cues required;Returned demonstration;Tactile cues required;Need further instruction       PT Short Term Goals - 06/13/18 1754      PT SHORT TERM GOAL #1    Title  Pt will be independent with HEP in order to improve strength and decrease cervical pain in order to improve pain-free function at home and work.      Time  4    Period  Weeks    Status  New        PT Long Term Goals - 06/13/18 1754      PT LONG TERM GOAL #1   Title  Pt will decrease worst neck pain as reported on NPRS by at least 2 points in order to demonstrate clinically significant reduction in back pain.     Baseline  06/13/18 4/10    Time  8    Period  Weeks    Status  New      PT LONG TERM GOAL #2   Title  Patient will increase FOTO score to 71 to demonstrate predicted increase in functional mobility to complete ADLs    Baseline  06/13/18 63    Time  8    Period  Weeks    Status  New      PT LONG TERM GOAL #3   Title  Patient will demonstrate full active cervical ROM in order to complete ADLs and demonstrate driving safety    Baseline  06/13/18 see eval    Time  8    Period  Weeks    Status  New            Plan - 06/13/18 1759    Clinical Impression Statement  Patient is a 63 year old female presenting with chronic cervical pain with radiation into bilat shoulder, and occassionally into L jaw. Patient with impairments in periscapular strength, posture, ant shoulder/posture tension, and pain. 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 Would benefit from skilled PT to address above deficits and promote optimal return to PLOF    Clinical Presentation  Evolving    Clinical Presentation due to:  Moderate (evolving): 1-2 personal factors/comorbidities, 3 or more body systems/activity limitations/participation restrictions      Clinical Decision Making  Moderate    Rehab Potential  Good    Clinical Impairments Affecting Rehab  Potential  (+) motivation (-) age, other comorbities, sedentary lifestyle    PT Frequency  2x / week    PT Duration  8 weeks     PT Treatment/Interventions  ADLs/Self Care Home Management;Aquatic Therapy;Electrical Stimulation;Cryotherapy;Iontophoresis 4mg /ml Dexamethasone;Moist Heat;Traction;Ultrasound;Therapeutic exercise;Therapeutic activities;Neuromuscular re-education;Patient/family education;Manual techniques;Passive range of motion;Dry needling;Taping;Joint Manipulations    PT Next Visit Plan  Tension relief, periscapular assessment and strengthening    PT Home Exercise Plan  UT stretch, SCM stretch, doorway pec stretch, scap retraction    Consulted and Agree with Plan of Care  Patient       Patient will benefit from skilled therapeutic intervention in order to improve the following deficits and impairments:  Decreased endurance, Decreased range of motion, Decreased strength, Increased fascial restricitons, Impaired UE functional use, Improper body mechanics, Pain, Postural dysfunction, Impaired flexibility, Increased muscle spasms, Decreased mobility  Visit Diagnosis: Cervicalgia - Plan: PT plan of care cert/re-cert     Problem List Patient Active Problem List   Diagnosis Date Noted  . Preventative health care 05/09/2018  . Lung nodule, solitary 05/09/2018  . IBS (irritable bowel syndrome) 11/14/2017  . Sciatic nerve pain, right 11/14/2017  . Personal history of kidney stones 11/14/2017  . Essential hypertension, benign 08/11/2016  . Overweight (BMI 25.0-29.9) 08/11/2016  . Heartburn 08/11/2016  . Varicella exposure 04/24/2015  . Osteopenia    Shelton Silvas PT, DPT Shelton Silvas 06/13/2018, 6:15 PM  [facility] [address], [place], [address] Phone: [phone]   Fax:  [phone]  Name: Alexandria Hill MRN: [id number] Date of Birth: 1955/11/30

## 2018-06-15 ENCOUNTER — Ambulatory Visit: Payer: Commercial Managed Care - HMO | Admitting: Physical Therapy

## 2018-06-19 ENCOUNTER — Other Ambulatory Visit: Payer: Self-pay | Admitting: Family Medicine

## 2018-06-19 DIAGNOSIS — Z1231 Encounter for screening mammogram for malignant neoplasm of breast: Secondary | ICD-10-CM

## 2018-06-20 ENCOUNTER — Ambulatory Visit: Payer: Commercial Managed Care - HMO | Admitting: Physical Therapy

## 2018-06-20 ENCOUNTER — Encounter: Payer: Self-pay | Admitting: Physical Therapy

## 2018-06-20 ENCOUNTER — Encounter: Payer: Self-pay | Admitting: Family Medicine

## 2018-06-20 DIAGNOSIS — M542 Cervicalgia: Secondary | ICD-10-CM | POA: Diagnosis not present

## 2018-06-20 NOTE — Therapy (Signed)
[facility] [address] [place], [address] Phone: [phone]   Fax:  [phone]  Physical Therapy Treatment  Patient Details  Name: Alexandria Hill MRN: [id number] Date of Birth: [birth year] Referring Provider (PT): Lada   Encounter Date: 06/20/2018  PT End of Session - 06/20/18 1737    Visit Number  2    Number of Visits  17    Date for PT Re-Evaluation  08/08/18    PT Start Time  0500    PT Stop Time  0545    PT Time Calculation (min)  45 min    Activity Tolerance  Patient tolerated treatment well    Behavior During Therapy  [facility] for tasks assessed/performed       Past Medical History:  Diagnosis Date  . GERD (gastroesophageal reflux disease)   . Hypertension   . IBS (irritable bowel syndrome) 2019  . Osteopenia   . Overweight (BMI 25.0-29.9) 08/11/2016  . Reflux   . Renal calculi 07/2017    Past Surgical History:  Procedure Laterality Date  . CESAREAN SECTION  1987  . COLONOSCOPY WITH PROPOFOL N/A 12/05/2014   Procedure: COLONOSCOPY WITH PROPOFOL;  Surgeon: Lucilla Lame, MD;  Location: [facility] ENDOSCOPY;  Service: Endoscopy;  Laterality: N/A;  . EXTRACORPOREAL SHOCK WAVE LITHOTRIPSY Left 07/14/2017   Procedure: EXTRACORPOREAL SHOCK WAVE LITHOTRIPSY (ESWL);  Surgeon: Abbie Sons, MD;  Location: [facility] ORS;  Service: Urology;  Laterality: Left;    There were no vitals filed for this visit.  Subjective Assessment - 06/20/18 1708    Subjective  Patient reports her neck has gotten a little better with exercises, but that her pain increased today following stress with a doctor bill. Reports 3/10 pain today. Compliance with HEP no questions or concerns.     Pertinent History  Patient is a 63 year female reporting to PT with post neck pain since Nov, with insideous onset that has been worsening since. Patient reports over Christmas when she had a break from the office her pain slightly improved. 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 Patient reports increased pain on L post neck that runs up to her jaw. Reports R neck pain is more along UT. Does report some radiating pain "between shoulder blades" Worst pain over the past week 4/10 worst 0/10. Pt denies N/V, unexplained weight fluctuation, B&B changes, saddle paresthesia, fever, night sweats, or unrelenting night pain at this time.    Limitations  Lifting;House hold activities    How long can you sit comfortably?  unlimited    How long can you stand comfortably?  unlimited    How long can you walk comfortably?  unlimited    Diagnostic tests  None     Patient Stated Goals  Decrease pain    Pain Onset  1 to 4 weeks ago       Manual - STM with trigger point release to R UT, levator, STM. Following: Dry Needling: (3) 46mm .30 needles placed along the R UT to decrease increased muscular spasms and trigger points with the patient positioned in supine, utilizing pincer grasp. Patient was educated on risks and benefits of therapy and verbally consents to PT.  - Cervical traction x10 10sec traction, 10sec relax  - Grade III UPA C0-C2 mobs 30sec bouts 4 bouts each segment bilat to increase cervical rotation - Grade I-II CPA C0-C2 mobs 30sec bouts 4 bouts  each segment for pain modulation   Ther-Ex - Supine pec stretch 96min on towel  - OMEGA standing rows 10# x10; 20# 2x 10 with demo and max cuing initially for proper posture and scapular retraction with good carry over following - Low rows with elbow ext blue TB 3x 10 with TC initially for proper posture with good carry over following                      PT Education - 06/20/18 1737    Education Details  TDN education; exercise form    Person(s) Educated  Patient    Methods  Explanation;Demonstration;Tactile cues;Verbal cues    Comprehension  Returned demonstration;Tactile cues required;Verbal cues required;Verbalized  understanding       PT Short Term Goals - 06/13/18 1754      PT SHORT TERM GOAL #1   Title  Pt will be independent with HEP in order to improve strength and decrease cervical pain in order to improve pain-free function at home and work.      Time  4    Period  Weeks    Status  New        PT Long Term Goals - 06/13/18 1754      PT LONG TERM GOAL #1   Title  Pt will decrease worst neck pain as reported on NPRS by at least 2 points in order to demonstrate clinically significant reduction in back pain.     Baseline  06/13/18 4/10    Time  8    Period  Weeks    Status  New      PT LONG TERM GOAL #2   Title  Patient will increase FOTO score to 71 to demonstrate predicted increase in functional mobility to complete ADLs    Baseline  06/13/18 63    Time  8    Period  Weeks    Status  New      PT LONG TERM GOAL #3   Title  Patient will demonstrate full active cervical ROM in order to complete ADLs and demonstrate driving safety    Baseline  06/13/18 see eval    Time  8    Period  Weeks    Status  New            Plan - 06/20/18 1804    Clinical Impression Statement  PT utilized manual and TDN to decrease muscle tension and improve ROM, which patient reports decreased pain following, with noted increased cervical rotation and decreased shoulder height. PT led patient through therex for periscapular musculature, which she was able to complete with accuracy following PT demo and cuing     Rehab Potential  Good    Clinical Impairments Affecting Rehab Potential  (+) motivation (-) age, other comorbities, sedentary lifestyle    PT Frequency  2x / week    PT Duration  8 weeks    PT Treatment/Interventions  ADLs/Self Care Home Management;Aquatic Therapy;Electrical Stimulation;Cryotherapy;Iontophoresis 4mg /ml Dexamethasone;Moist Heat;Traction;Ultrasound;Therapeutic exercise;Therapeutic activities;Neuromuscular re-education;Patient/family education;Manual techniques;Passive range of  motion;Dry needling;Taping;Joint Manipulations    PT Next Visit Plan  Tension relief, periscapular assessment and strengthening    PT Home Exercise Plan  UT stretch, SCM stretch, doorway pec stretch, scap retraction    Consulted and Agree with Plan of Care  Patient       Patient will benefit from skilled therapeutic intervention in order to improve the following deficits and impairments:  Decreased endurance, Decreased range of motion,  Decreased strength, Increased fascial restricitons, Impaired UE functional use, Improper body mechanics, Pain, Postural dysfunction, Impaired flexibility, Increased muscle spasms, Decreased mobility  Visit Diagnosis: Cervicalgia     Problem List Patient Active Problem List   Diagnosis Date Noted  . Preventative health care 05/09/2018  . Lung nodule, solitary 05/09/2018  . IBS (irritable bowel syndrome) 11/14/2017  . Sciatic nerve pain, right 11/14/2017  . Personal history of kidney stones 11/14/2017  . Essential hypertension, benign 08/11/2016  . Overweight (BMI 25.0-29.9) 08/11/2016  . Heartburn 08/11/2016  . Varicella exposure 04/24/2015  . Osteopenia    Shelton Silvas PT, DPT Shelton Silvas 06/20/2018, 6:15 PM  [facility] [address] [place], [address] Phone: [phone]   Fax:  [phone]  Name: Alexandria Hill MRN: [id number] Date of Birth: July 11, 1955

## 2018-06-22 ENCOUNTER — Ambulatory Visit: Payer: Commercial Managed Care - HMO | Admitting: Physical Therapy

## 2018-06-22 ENCOUNTER — Encounter: Payer: Self-pay | Admitting: Physical Therapy

## 2018-06-22 DIAGNOSIS — M542 Cervicalgia: Secondary | ICD-10-CM

## 2018-06-22 NOTE — Therapy (Signed)
[facility] [address] [place], [address] Phone: [phone]   Fax:  [phone]  Physical Therapy Treatment  Patient Details  Name: Alexandria Hill MRN: [id number] Date of Birth: 1955/12/26 Referring Provider (PT): Lada   Encounter Date: 06/22/2018  PT End of Session - 06/22/18 1638    Visit Number  3    Number of Visits  17    Date for PT Re-Evaluation  08/08/18    PT Start Time  0415    PT Stop Time  0500    PT Time Calculation (min)  45 min    Activity Tolerance  Patient tolerated treatment well    Behavior During Therapy  [facility] for tasks assessed/performed       Past Medical History:  Diagnosis Date  . GERD (gastroesophageal reflux disease)   . Hypertension   . IBS (irritable bowel syndrome) 2019  . Osteopenia   . Overweight (BMI 25.0-29.9) 08/11/2016  . Reflux   . Renal calculi 07/2017    Past Surgical History:  Procedure Laterality Date  . CESAREAN SECTION  1987  . COLONOSCOPY WITH PROPOFOL N/A 12/05/2014   Procedure: COLONOSCOPY WITH PROPOFOL;  Surgeon: Lucilla Lame, MD;  Location: [facility] ENDOSCOPY;  Service: Endoscopy;  Laterality: N/A;  . EXTRACORPOREAL SHOCK WAVE LITHOTRIPSY Left 07/14/2017   Procedure: EXTRACORPOREAL SHOCK WAVE LITHOTRIPSY (ESWL);  Surgeon: Abbie Sons, MD;  Location: [facility] ORS;  Service: Urology;  Laterality: Left;    There were no vitals filed for this visit.  Subjective Assessment - 06/22/18 1620    Subjective  Patient reports her pain is better today 1-2/10, and that she was sore the night after TDN, but felt better following.     Pertinent History  Patient is a 63 year female reporting to PT with post neck pain since Nov, with insideous onset that has been worsening since. Patient reports over Christmas when she had a break from the office her pain slightly improved. 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  Patient reports increased pain on L post neck that runs up to her jaw. Reports R neck pain is more along UT. Does report some radiating pain "between shoulder blades" Worst pain over the past week 4/10 worst 0/10. Pt denies N/V, unexplained weight fluctuation, B&B changes, saddle paresthesia, fever, night sweats, or unrelenting night pain at this time.    Limitations  Lifting;House hold activities    How long can you sit comfortably?  unlimited    How long can you stand comfortably?  unlimited    How long can you walk comfortably?  unlimited    Diagnostic tests  None     Patient Stated Goals  Decrease pain    Pain Onset  1 to 4 weeks ago         Manual - STM with trigger point release to L UT, levator, and pec pinor Following: Dry Needling: (3) 62mm .30 needles placed along the L UT to decrease increased muscular spasms and trigger points with the patient positioned in supine, utilizing pincer grasp. Patient was educated on risks and benefits of therapy and verbally consents to PT.  - Cervical traction x10 10sec traction, 10sec relax  - Grade III UPA C0-C2 mobs 30sec bouts 4 bouts each segment bilat to increase cervical rotation - Grade I-II CPA C0-C2 mobs 30sec bouts 4 bouts each segment for pain modulation   Ther-Ex -  Supine pec stretch 106min on towel  - OMEGA standing rows 20# 3x 10 with carry over from previous session with min cuing for decreased shoulder hiking - Seated lat pulldown 20# 3x 10 with TC initially for proper posture and movement with good carry over following - Scaption in standing w/ 2# DB in each hand with TC initial to maintain proper shoulder positioning/retraction with good carry over folloiwng                      PT Education - 06/22/18 1635    Education Details  Exercise form    Person(s) Educated  Patient    Methods  Explanation;Demonstration;Verbal cues;Tactile cues    Comprehension  Verbalized understanding;Returned demonstration;Verbal  cues required;Tactile cues required       PT Short Term Goals - 06/13/18 1754      PT SHORT TERM GOAL #1   Title  Pt will be independent with HEP in order to improve strength and decrease cervical pain in order to improve pain-free function at home and work.      Time  4    Period  Weeks    Status  New        PT Long Term Goals - 06/13/18 1754      PT LONG TERM GOAL #1   Title  Pt will decrease worst neck pain as reported on NPRS by at least 2 points in order to demonstrate clinically significant reduction in back pain.     Baseline  06/13/18 4/10    Time  8    Period  Weeks    Status  New      PT LONG TERM GOAL #2   Title  Patient will increase FOTO score to 71 to demonstrate predicted increase in functional mobility to complete ADLs    Baseline  06/13/18 63    Time  8    Period  Weeks    Status  New      PT LONG TERM GOAL #3   Title  Patient will demonstrate full active cervical ROM in order to complete ADLs and demonstrate driving safety    Baseline  06/13/18 see eval    Time  8    Period  Weeks    Status  New            Plan - 06/22/18 1704    Clinical Impression Statement  PT continued manual techniques for pain management, which patient continues to respond well to. PT progressed therex, which patient is able to complete with no increased pain, only noted muscle fatigue, requiring cuing for accuracy, with good carry over.     Rehab Potential  Good    Clinical Impairments Affecting Rehab Potential  (+) motivation (-) age, other comorbities, sedentary lifestyle    PT Frequency  2x / week    PT Duration  8 weeks    PT Treatment/Interventions  ADLs/Self Care Home Management;Aquatic Therapy;Electrical Stimulation;Cryotherapy;Iontophoresis 4mg /ml Dexamethasone;Moist Heat;Traction;Ultrasound;Therapeutic exercise;Therapeutic activities;Neuromuscular re-education;Patient/family education;Manual techniques;Passive range of motion;Dry needling;Taping;Joint Manipulations    PT  Next Visit Plan  Tension relief, periscapular assessment and strengthening    PT Home Exercise Plan  UT stretch, SCM stretch, doorway pec stretch, scap retraction    Consulted and Agree with Plan of Care  Patient       Patient will benefit from skilled therapeutic intervention in order to improve the following deficits and impairments:  Decreased endurance, Decreased range of motion, Decreased strength, Increased fascial  restricitons, Impaired UE functional use, Improper body mechanics, Pain, Postural dysfunction, Impaired flexibility, Increased muscle spasms, Decreased mobility  Visit Diagnosis: Cervicalgia     Problem List Patient Active Problem List   Diagnosis Date Noted  . Preventative health care 05/09/2018  . Lung nodule, solitary 05/09/2018  . IBS (irritable bowel syndrome) 11/14/2017  . Sciatic nerve pain, right 11/14/2017  . Personal history of kidney stones 11/14/2017  . Essential hypertension, benign 08/11/2016  . Overweight (BMI 25.0-29.9) 08/11/2016  . Heartburn 08/11/2016  . Varicella exposure 04/24/2015  . Osteopenia    Shelton Silvas PT, DPT Shelton Silvas 06/22/2018, 5:08 PM  [place] [address], [place], [address] Phone: [phone]   Fax:  [phone]  Name: SHENG PRITZ MRN: [id number] Date of Birth: 07-17-1955

## 2018-06-23 ENCOUNTER — Ambulatory Visit: Payer: Commercial Managed Care - HMO | Admitting: Physical Therapy

## 2018-06-27 ENCOUNTER — Ambulatory Visit: Payer: Commercial Managed Care - HMO | Admitting: Physical Therapy

## 2018-06-27 ENCOUNTER — Encounter: Payer: Self-pay | Admitting: Physical Therapy

## 2018-06-27 DIAGNOSIS — M542 Cervicalgia: Secondary | ICD-10-CM

## 2018-06-27 NOTE — Therapy (Signed)
[facility] [address], [place], [address] Phone: [phone]   Fax:  [phone]  Physical Therapy Treatment  Patient Details  Name: PRUE LINGENFELTER MRN: [id number] Date of Birth: 04-Sep-1955 Referring Provider (PT): Lada   Encounter Date: 06/27/2018  PT End of Session - 06/27/18 1731    Visit Number  4    Number of Visits  17    Date for PT Re-Evaluation  08/08/18    PT Start Time  0500    PT Stop Time  0545    PT Time Calculation (min)  45 min    Activity Tolerance  Patient tolerated treatment well    Behavior During Therapy  [facility] for tasks assessed/performed       Past Medical History:  Diagnosis Date  . GERD (gastroesophageal reflux disease)   . Hypertension   . IBS (irritable bowel syndrome) 2019  . Osteopenia   . Overweight (BMI 25.0-29.9) 08/11/2016  . Reflux   . Renal calculi 07/2017    Past Surgical History:  Procedure Laterality Date  . CESAREAN SECTION  1987  . COLONOSCOPY WITH PROPOFOL N/A 12/05/2014   Procedure: COLONOSCOPY WITH PROPOFOL;  Surgeon: Lucilla Lame, MD;  Location: [facility];  Service: Endoscopy;  Laterality: N/A;  . EXTRACORPOREAL SHOCK WAVE LITHOTRIPSY Left 07/14/2017   Procedure: EXTRACORPOREAL SHOCK WAVE LITHOTRIPSY (ESWL);  Surgeon: Abbie Sons, MD;  Location: [facility] ORS;  Service: Urology;  Laterality: Left;    There were no vitals filed for this visit.  Subjective Assessment - 06/27/18 1708    Subjective  Patient reports increased pain over the weekend following cleaning her home, that has decreased to 2/10 today. patietn reports some diligence with HEP with no questions or concerns.     Pertinent History  Patient is a 63 year female reporting to PT with post neck pain since Nov, with insideous onset that has been worsening since. Patient reports over Christmas when she had a break from the office her pain slightly improved. 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 Patient reports increased pain on L post neck that runs up to her jaw. Reports R neck pain is more along UT. Does report some radiating pain "between shoulder blades" Worst pain over the past week 4/10 worst 0/10. Pt denies N/V, unexplained weight fluctuation, B&B changes, saddle paresthesia, fever, night sweats, or unrelenting night pain at this time.    Limitations  Lifting;House hold activities    How long can you sit comfortably?  unlimited    How long can you stand comfortably?  unlimited    How long can you walk comfortably?  unlimited    Diagnostic tests  None     Patient Stated Goals  Decrease pain    Pain Onset  1 to 4 weeks ago          Manual - STM withtrigger point releaseto L UT, levator, and SCM Following:Dry Needling: (3)27mm .30needles placed along the L UTto decrease increased muscular spasms and trigger points with the patient positioned in supine, utilizing pincer grasp. Patient was educated on risks and benefits of therapy and verbally consents to PT. - Cervical traction x10 10sec traction, 10sec relax  - Grade I-II CPA C0-C2 mobs 30sec bouts 4 bouts each segment for pain modulation  Ther-Ex - Supine pec stretch on rolled towel 2 min (patient reports dicciulty with doorway stretch d/t the doors in  her home, this stretch given as HEP replacement  - Serratus punches 2# DB 3x 10 with max TC and VC initially for proper form with good carry over following - OMEGA standing rows 20# 2x 10 25# x10 with good carry over from previous session - High rows 10# x10 15# 2x 10 with demo and cuing needed initially for proper form with good carry over following  - Seated lat pulldown 25# 3x 10 with good carry over from previous session, min cuing to prevent shoulder hiking - Standing low rows RTB 3x 10 with demo and cuing initially for proper form without shoulder hiking with good carry over  following                       PT Education - 06/27/18 1730    Education Details  Exercise technique    Person(s) Educated  Patient    Methods  Explanation;Verbal cues;Tactile cues    Comprehension  Verbalized understanding;Verbal cues required;Tactile cues required       PT Short Term Goals - 06/13/18 1754      PT SHORT TERM GOAL #1   Title  Pt will be independent with HEP in order to improve strength and decrease cervical pain in order to improve pain-free function at home and work.      Time  4    Period  Weeks    Status  New        PT Long Term Goals - 06/13/18 1754      PT LONG TERM GOAL #1   Title  Pt will decrease worst neck pain as reported on NPRS by at least 2 points in order to demonstrate clinically significant reduction in back pain.     Baseline  06/13/18 4/10    Time  8    Period  Weeks    Status  New      PT LONG TERM GOAL #2   Title  Patient will increase FOTO score to 71 to demonstrate predicted increase in functional mobility to complete ADLs    Baseline  06/13/18 63    Time  8    Period  Weeks    Status  New      PT LONG TERM GOAL #3   Title  Patient will demonstrate full active cervical ROM in order to complete ADLs and demonstrate driving safety    Baseline  06/13/18 see eval    Time  8    Period  Weeks    Status  New            Plan - 06/27/18 1737    Clinical Impression Statement  Patient continued to respond well to manual techniques for pain/muscle tension reduction. Patient requiring some cuing through therex for proper form, with good carry over and accuracy following. PT will continue to increase periscapular strength, and reduce muscle tension to restore proper lengthtension relationship, and encourage neutral posture.     Rehab Potential  Good    Clinical Impairments Affecting Rehab Potential  (+) motivation (-) age, other comorbities, sedentary lifestyle    PT Frequency  2x / week    PT Duration  8 weeks    PT  Treatment/Interventions  ADLs/Self Care Home Management;Aquatic Therapy;Electrical Stimulation;Cryotherapy;Iontophoresis 4mg /ml Dexamethasone;Moist Heat;Traction;Ultrasound;Therapeutic exercise;Therapeutic activities;Neuromuscular re-education;Patient/family education;Manual techniques;Passive range of motion;Dry needling;Taping;Joint Manipulations    PT Next Visit Plan  Tension relief, periscapular assessment and strengthening    PT Home Exercise Plan  UT stretch, SCM  stretch, doorway pec stretch, scap retraction    Consulted and Agree with Plan of Care  Patient       Patient will benefit from skilled therapeutic intervention in order to improve the following deficits and impairments:  Decreased endurance, Decreased range of motion, Decreased strength, Increased fascial restricitons, Impaired UE functional use, Improper body mechanics, Pain, Postural dysfunction, Impaired flexibility, Increased muscle spasms, Decreased mobility  Visit Diagnosis: Cervicalgia     Problem List Patient Active Problem List   Diagnosis Date Noted  . Preventative health care 05/09/2018  . Lung nodule, solitary 05/09/2018  . IBS (irritable bowel syndrome) 11/14/2017  . Sciatic nerve pain, right 11/14/2017  . Personal history of kidney stones 11/14/2017  . Essential hypertension, benign 08/11/2016  . Overweight (BMI 25.0-29.9) 08/11/2016  . Heartburn 08/11/2016  . Varicella exposure 04/24/2015  . Osteopenia     Rachael Fee, SPT 06/27/2018, 5:39 PM  [facility] [address] [place], [address] Phone: [phone]   Fax:  [phone]  Name: CRESCENTIA BOUTWELL MRN: [id number] Date of Birth: [birth year]

## 2018-06-27 NOTE — Therapy (Signed)
[facility] [address] [place], [address] Phone: [phone]   Fax:  [phone]  Physical Therapy Treatment  Patient Details  Name: Alexandria Hill MRN: [id number] Date of Birth: 07/09/1955 Referring Provider (PT): Lada   Encounter Date: 06/27/2018  PT End of Session - 06/27/18 1731    Visit Number  4    Number of Visits  17    Date for PT Re-Evaluation  08/08/18    PT Start Time  0500    PT Stop Time  0545    PT Time Calculation (min)  45 min    Activity Tolerance  Patient tolerated treatment well    Behavior During Therapy  [facility] for tasks assessed/performed       Past Medical History:  Diagnosis Date  . GERD (gastroesophageal reflux disease)   . Hypertension   . IBS (irritable bowel syndrome) 2019  . Osteopenia   . Overweight (BMI 25.0-29.9) 08/11/2016  . Reflux   . Renal calculi 07/2017    Past Surgical History:  Procedure Laterality Date  . CESAREAN SECTION  1987  . COLONOSCOPY WITH PROPOFOL N/A 12/05/2014   Procedure: COLONOSCOPY WITH PROPOFOL;  Surgeon: Lucilla Lame, MD;  Location: [facility];  Service: Endoscopy;  Laterality: N/A;  . EXTRACORPOREAL SHOCK WAVE LITHOTRIPSY Left 07/14/2017   Procedure: EXTRACORPOREAL SHOCK WAVE LITHOTRIPSY (ESWL);  Surgeon: Abbie Sons, MD;  Location: [facility]S;  Service: Urology;  Laterality: Left;    There were no vitals filed for this visit.  Subjective Assessment - 06/27/18 1708    Subjective  Patient reports increased pain over the weekend following cleaning her home, that has decreased to 2/10 today. patietn reports some diligence with HEP with no questions or concerns.     Pertinent History  Patient is a 63 year female reporting to PT with post neck pain since Nov, with insideous onset that has been worsening since. Patient reports over Christmas when she had a break from the office her pain slightly improved. 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 Patient reports increased pain on L post neck that runs up to her jaw. Reports R neck pain is more along UT. Does report some radiating pain "between shoulder blades" Worst pain over the past week 4/10 worst 0/10. Pt denies N/V, unexplained weight fluctuation, B&B changes, saddle paresthesia, fever, night sweats, or unrelenting night pain at this time.    Limitations  Lifting;House hold activities    How long can you sit comfortably?  unlimited    How long can you stand comfortably?  unlimited    How long can you walk comfortably?  unlimited    Diagnostic tests  None     Patient Stated Goals  Decrease pain    Pain Onset  1 to 4 weeks ago        Manual - STM withtrigger point releaseto L UT, levator, and pec pinor Following:Dry Needling: (3)54mm .30needles placed along the L UTto decrease increased muscular spasms and trigger points with the patient positioned in supine, utilizing pincer grasp. Patient was educated on risks and benefits of therapy and verbally consents to PT. - Cervical traction x10 10sec traction, 10sec relax  - Grade III UPA C0-C2 mobs 30sec bouts 4 bouts each segment bilat to increase cervical rotation   Ther-Ex - Supine pec stretch 84min on towel  - OMEGA standing rows 20# 3x 10 with carry  over from previous session with min cuing for decreased shoulder hiking - Seated lat pulldown 20# 3x 10 with TC initially for proper posture and movement with good carry over following - Scaption in standing w/ 2# DB in each hand with TC initial to maintain proper shoulder positioning/retraction with good carry over folloiwng                        PT Education - 06/27/18 1730    Education Details  Exercise technique    Person(s) Educated  Patient    Methods  Explanation;Verbal cues;Tactile cues    Comprehension  Verbalized understanding;Verbal cues required;Tactile cues required        PT Short Term Goals - 06/13/18 1754      PT SHORT TERM GOAL #1   Title  Pt will be independent with HEP in order to improve strength and decrease cervical pain in order to improve pain-free function at home and work.      Time  4    Period  Weeks    Status  New        PT Long Term Goals - 06/13/18 1754      PT LONG TERM GOAL #1   Title  Pt will decrease worst neck pain as reported on NPRS by at least 2 points in order to demonstrate clinically significant reduction in back pain.     Baseline  06/13/18 4/10    Time  8    Period  Weeks    Status  New      PT LONG TERM GOAL #2   Title  Patient will increase FOTO score to 71 to demonstrate predicted increase in functional mobility to complete ADLs    Baseline  06/13/18 63    Time  8    Period  Weeks    Status  New      PT LONG TERM GOAL #3   Title  Patient will demonstrate full active cervical ROM in order to complete ADLs and demonstrate driving safety    Baseline  06/13/18 see eval    Time  8    Period  Weeks    Status  New            Plan - 06/27/18 1737    Clinical Impression Statement  Patient continued to respond well to manual techniques for pain/muscle tension reduction. Patient requiring some cuing through therex for proper form, with good carry over and accuracy following. PT will continue to increase periscapular strength, and reduce muscle tension to restore proper lengthtension relationship, and encourage neutral posture.     Rehab Potential  Good    Clinical Impairments Affecting Rehab Potential  (+) motivation (-) age, other comorbities, sedentary lifestyle    PT Frequency  2x / week    PT Duration  8 weeks    PT Treatment/Interventions  ADLs/Self Care Home Management;Aquatic Therapy;Electrical Stimulation;Cryotherapy;Iontophoresis 4mg /ml Dexamethasone;Moist Heat;Traction;Ultrasound;Therapeutic exercise;Therapeutic activities;Neuromuscular re-education;Patient/family education;Manual techniques;Passive range  of motion;Dry needling;Taping;Joint Manipulations    PT Next Visit Plan  Tension relief, periscapular assessment and strengthening    PT Home Exercise Plan  UT stretch, SCM stretch, doorway pec stretch, scap retraction    Consulted and Agree with Plan of Care  Patient       Patient will benefit from skilled therapeutic intervention in order to improve the following deficits and impairments:  Decreased endurance, Decreased range of motion, Decreased strength, Increased fascial restricitons, Impaired UE functional use, Improper  body mechanics, Pain, Postural dysfunction, Impaired flexibility, Increased muscle spasms, Decreased mobility  Visit Diagnosis: Cervicalgia     Problem List Patient Active Problem List   Diagnosis Date Noted  . Preventative health care 05/09/2018  . Lung nodule, solitary 05/09/2018  . IBS (irritable bowel syndrome) 11/14/2017  . Sciatic nerve pain, right 11/14/2017  . Personal history of kidney stones 11/14/2017  . Essential hypertension, benign 08/11/2016  . Overweight (BMI 25.0-29.9) 08/11/2016  . Heartburn 08/11/2016  . Varicella exposure 04/24/2015  . Osteopenia    Shelton Silvas PT, DPT Shelton Silvas 06/27/2018, 5:52 PM  [facility] [address], [place], [address] Phone: [phone]   Fax:  [phone]  Name: KANDAS OLIVETO MRN: [id number] Date of Birth: 1956/04/11

## 2018-07-04 ENCOUNTER — Encounter: Payer: 59 | Admitting: Physical Therapy

## 2018-07-04 ENCOUNTER — Ambulatory Visit (INDEPENDENT_AMBULATORY_CARE_PROVIDER_SITE_OTHER): Payer: 59 | Admitting: Cardiovascular Disease

## 2018-07-04 ENCOUNTER — Encounter: Payer: Self-pay | Admitting: Cardiovascular Disease

## 2018-07-04 VITALS — BP 131/78 | HR 75 | Ht 63.0 in | Wt 150.0 lb

## 2018-07-04 DIAGNOSIS — I10 Essential (primary) hypertension: Secondary | ICD-10-CM | POA: Diagnosis not present

## 2018-07-04 DIAGNOSIS — E785 Hyperlipidemia, unspecified: Secondary | ICD-10-CM

## 2018-07-04 NOTE — Progress Notes (Signed)
Cardiology Office Note   Date:  07/04/2018   ID:  [name], [name] [birth year], MRN [id number]  PCP:  Arnetha Courser, MD  Cardiologist:   Kathlyn Sacramento, MD   Chief Complaint  Patient presents with  . Other    2 month follow up. Patient states her blood pressure runs about 100s-110s and she isnt sure if that is to low for her. Patient denies chest pain and SOB at this time. Meds reviewed verbally with patient.       History of Present Illness: Alexandria Hill is a 63 y.o. female who presents for a follow-up visit regarding hypertension.  She was seen in the past by Dr. Yvone Neu.  She quit smoking 1986.  She has other medical problems including GERD and family history of coronary artery disease. She was seen by Thurmond Butts in October for atypical chest pain.  Previous [employer] in March 2018 was normal.  Echocardiogram around the same time also was unremarkable. The patient's blood pressure was running high at that time.  She underwent CTA of the coronary arteries which showed normal coronary arteries and a calcium score of 0. He has been doing well with no recent chest pain or shortness of breath.  Past Medical History:  Diagnosis Date  . GERD (gastroesophageal reflux disease)   . Hypertension   . IBS (irritable bowel syndrome) 2019  . Osteopenia   . Overweight (BMI 25.0-29.9) 08/11/2016  . Reflux   . Renal calculi 07/2017    Past Surgical History:  Procedure Laterality Date  . CESAREAN SECTION  1987  . COLONOSCOPY WITH PROPOFOL N/A 12/05/2014   Procedure: COLONOSCOPY WITH PROPOFOL;  Surgeon: Lucilla Lame, MD;  Location: [facility];  Service: Endoscopy;  Laterality: N/A;  . EXTRACORPOREAL SHOCK WAVE LITHOTRIPSY Left 07/14/2017   Procedure: EXTRACORPOREAL SHOCK WAVE LITHOTRIPSY (ESWL);  Surgeon: Abbie Sons, MD;  Location: [facility] ORS;  Service: Urology;  Laterality: Left;     Current Outpatient Medications  Medication Sig Dispense Refill  . aspirin 81 MG tablet Take  81 mg by mouth daily.    . Calcium Citrate-Vitamin D 200-250 MG-UNIT TABS Take by mouth daily.    Marland Kitchen lisinopril (PRINIVIL,ZESTRIL) 10 MG tablet Take 1 tablet (10 mg) once daily in the morning. 90 tablet 3  . Multiple Vitamin (MULTIVITAMIN) capsule Take 1 capsule by mouth daily.    . vitamin C (ASCORBIC ACID) 500 MG tablet Take 500 mg by mouth daily.     No current facility-administered medications for this visit.     Allergies:   Patient has no known allergies.    Social History:  The patient  reports that she quit smoking about 33 years ago. Her smoking use included cigarettes. She has a 5.00 pack-year smoking history. She has never used smokeless tobacco. She reports current alcohol use. She reports that she does not use drugs.   Family History:  The patient's family history includes Aneurysm in her mother; Cancer in her brother and maternal grandmother; Depression in her sister and sister; Diabetes in her maternal aunt; Heart attack in her father; Heart disease in her father; Hypertension in her mother, sister, sister, and sister; Mental illness in her sister and sister; Mental retardation in her father; Obesity in her sister; Stroke in her mother.    ROS:  Please see the history of present illness.   Otherwise, review of systems are positive for none.   All other systems are reviewed and negative.  PHYSICAL EXAM: VS:  BP 131/78 (BP Location: Left Arm, Patient Position: Sitting, Cuff Size: Normal)   Pulse 75   Ht 5\' 3"  (1.6 m)   Wt 150 lb (68 kg)   BMI 26.57 kg/m  , BMI Body mass index is 26.57 kg/m. GEN: Well nourished, well developed, in no acute distress  HEENT: normal  Neck: no JVD, carotid bruits, or masses Cardiac: RRR; no murmurs, rubs, or gallops,no edema  Respiratory:  clear to auscultation bilaterally, normal work of breathing GI: soft, nontender, nondistended, + BS MS: no deformity or atrophy  Skin: warm and dry, no rash Neuro:  Strength and sensation are  intact Psych: euthymic mood, full affect   EKG:  EKG is not ordered today.   Recent Labs: 05/09/2018: ALT 16; BUN 17; Creat 0.79; Hemoglobin 13.7; Platelets 321; Potassium 4.4; Sodium 139; TSH 0.92    Lipid Panel    Component Value Date/Time   CHOL 194 05/09/2018 0941   CHOL 169 04/24/2015 0848   TRIG 87 05/09/2018 0941   HDL 54 05/09/2018 0941   HDL 53 04/24/2015 0848   CHOLHDL 3.6 05/09/2018 0941   VLDL 17 04/26/2016 0903   LDLCALC 121 (H) 05/09/2018 0941      Wt Readings from Last 3 Encounters:  07/04/18 150 lb (68 kg)  05/12/18 152 lb 12 oz (69.3 kg)  05/09/18 150 lb 8 oz (68.3 kg)       PAD Screen 08/12/2016  Previous PAD dx? No  Previous surgical procedure? No  Pain with walking? Yes  Subsides with rest? Yes  Feet/toe relief with dangling? No  Painful, non-healing ulcers? No  Extremities discolored? No      ASSESSMENT AND PLAN:  1.  Essential hypertension: Blood pressure seems to be reasonably controlled on lisinopril.  I made no changes.  2.  Hyperlipidemia: Most recent LDL was 121.  CTA of the coronary arteries showed no evidence of coronary atherosclerosis with calcium score of 0.  Thus, I do not think she needs treatment with a statin yet.  I advised her to continue with healthy lifestyle changes.  I also explained to her that aspirin is no longer recommended for primary prevention of cardiovascular events and thus I discontinued the medication.    Disposition:   FU with me in 1 year  Signed,  Kathlyn Sacramento, MD  07/04/2018 5:06 PM    [place]

## 2018-07-04 NOTE — Patient Instructions (Signed)
Medication Instructions:  No changes  If you need a refill on your cardiac medications before your next appointment, please call your pharmacy.   Lab work: None ordered  Testing/Procedures: None ordered  Follow-Up: At [facility], you and your health needs are our priority.  As part of our continuing mission to provide you with exceptional heart care, we have created designated Provider Care Teams.  These Care Teams include your primary Cardiologist (physician) and Advanced Practice Providers (APPs -  Physician Assistants and Nurse Practitioners) who all work together to provide you with the care you need, when you need it. You will need a follow up appointment in 12 months.  Please call our office 2 months in advance to schedule this appointment.  You may see Dr. Arida or one of the following Advanced Practice Providers on your designated Care Team:   Christopher Berge, NP Ryan Dunn, PA-C . Jacquelyn Visser, PA-C    

## 2018-07-05 ENCOUNTER — Ambulatory Visit
Admission: RE | Admit: 2018-07-05 | Discharge: 2018-07-05 | Disposition: A | Discharge status: Home / Self Care | Payer: 59 | Source: Ambulatory Visit | Attending: Family Medicine | Admitting: Family Medicine

## 2018-07-05 DIAGNOSIS — Z1231 Encounter for screening mammogram for malignant neoplasm of breast: Secondary | ICD-10-CM | POA: Insufficient documentation

## 2018-07-06 ENCOUNTER — Encounter: Payer: Self-pay | Admitting: Physical Therapy

## 2018-07-06 ENCOUNTER — Ambulatory Visit: Payer: Commercial Managed Care - HMO | Admitting: Physical Therapy

## 2018-07-06 DIAGNOSIS — M542 Cervicalgia: Secondary | ICD-10-CM

## 2018-07-06 NOTE — Therapy (Signed)
[place] [facility] [address] [place], [address] Phone: [phone]   Fax:  [phone]  Physical Therapy Treatment  Patient Details  Name: Alexandria Hill MRN: [id number] Date of Birth: 08/18/1955 Referring Provider (PT): Lada   Encounter Date: 07/06/2018  PT End of Session - 07/06/18 1645    Visit Number  5    Number of Visits  17    Date for PT Re-Evaluation  08/08/18    PT Start Time  0423    PT Stop Time  0502    PT Time Calculation (min)  39 min    Activity Tolerance  Patient tolerated treatment well    Behavior During Therapy  [facility] for tasks assessed/performed       Past Medical History:  Diagnosis Date  . GERD (gastroesophageal reflux disease)   . Hypertension   . IBS (irritable bowel syndrome) 2019  . Osteopenia   . Overweight (BMI 25.0-29.9) 08/11/2016  . Reflux   . Renal calculi 07/2017    Past Surgical History:  Procedure Laterality Date  . CESAREAN SECTION  1987  . COLONOSCOPY WITH PROPOFOL N/A 12/05/2014   Procedure: COLONOSCOPY WITH PROPOFOL;  Surgeon: Lucilla Lame, MD;  Location: [facility];  Service: Endoscopy;  Laterality: N/A;  . EXTRACORPOREAL SHOCK WAVE LITHOTRIPSY Left 07/14/2017   Procedure: EXTRACORPOREAL SHOCK WAVE LITHOTRIPSY (ESWL);  Surgeon: Abbie Sons, MD;  Location: [facility] OR[facility];  Service: Urology;  Laterality: Left;    There were no vitals filed for this visit.  Subjective Assessment - 07/06/18 1626    Subjective  Patient reports she felt better following TDN last session, was sore for a while. Reports compliance with HEP with no questions or concerns.     Pertinent History  Patient is a 64 year female reporting to PT with post neck pain since Nov, with insideous onset that has been worsening since. Patient reports over Christmas when she had a break from the office her pain slightly improved. 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 Patient reports increased pain on L post neck that runs up to her jaw. Reports R neck pain is more along UT. Does report some radiating pain "between shoulder blades" Worst pain over the past week 4/10 worst 0/10. Pt denies N/V, unexplained weight fluctuation, B&B changes, saddle paresthesia, fever, night sweats, or unrelenting night pain at this time.    Limitations  Lifting;House hold activities    How long can you sit comfortably?  unlimited    How long can you stand comfortably?  unlimited    How long can you walk comfortably?  unlimited    Diagnostic tests  None     Patient Stated Goals  Decrease pain    Pain Onset  1 to 4 weeks ago       Manual - STM withtrigger point releasetoLUT, levator, and SCMFollowing:Dry Needling: (3)53mm .30needles placed along theLUTto decrease increased muscular spasms and trigger points with the patient positioned in supine, utilizing pincer grasp. Patient was educated on risks and benefits of therapy and verbally consents to PT. - Cervical traction x10 10sec traction, 10sec relax  - Grade I-II CPA C0-C2 mobs 30sec bouts 4 bouts each segment for pain modulation  ESTIM + heat pack HiVolt ESTIM 15 min at patient tolerated 135V increased to 195V through treatment at L UT/leavator area . Attempted to decrease pain and muscle tension. With PT assessing patient  tolerance throughout (increasing intensity as needed), monitoring skin integrity (normal), with decreased pain noted from patient. Discussed possible asymmetry causes, including which UE she supports phone with at work, how she drives, sleeps, etc. Instructed patient to sleep on L side and ensure bilat UE use with driving until next visit.   Ther-Ex - Supine pec stretch on rolled towel 2 min (patient reports dicciulty with doorway stretch d/t the doors in her home, this stretch given as HEP replacement  - Seated UT stretch 2x 30sec hold each way  - Seated levator stretch 30sec  hold  - Education to discontinue strengthening therex until next PT appt, and to complete stretching only to aid in decreasing possible muscle soreness- with education on the difference between muscle soreness and pain, with pain science education on body perception of pain.                         PT Education - 07/06/18 1645    Education Details  ESTIM education    Person(s) Educated  Patient    Methods  Explanation;Demonstration    Comprehension  Verbalized understanding;Returned demonstration;Verbal cues required       PT Short Term Goals - 06/13/18 1754      PT SHORT TERM GOAL #1   Title  Pt will be independent with HEP in order to improve strength and decrease cervical pain in order to improve pain-free function at home and work.      Time  4    Period  Weeks    Status  New        PT Long Term Goals - 06/13/18 1754      PT LONG TERM GOAL #1   Title  Pt will decrease worst neck pain as reported on NPRS by at least 2 points in order to demonstrate clinically significant reduction in back pain.     Baseline  06/13/18 4/10    Time  8    Period  Weeks    Status  New      PT LONG TERM GOAL #2   Title  Patient will increase FOTO score to 71 to demonstrate predicted increase in functional mobility to complete ADLs    Baseline  06/13/18 63    Time  8    Period  Weeks    Status  New      PT LONG TERM GOAL #3   Title  Patient will demonstrate full active cervical ROM in order to complete ADLs and demonstrate driving safety    Baseline  06/13/18 see eval    Time  8    Period  Weeks    Status  New            Plan - 07/06/18 1723    Clinical Impression Statement  Patient is making good progress overall, but continues to report some pain at L UT/levator area. PT utilized increased pain management techniuqes this session, and instructed patinet to decrease strengthening, and utilize stretching only over the weekend, in order to gauge if relaxation makes a  difference in pain response. Educated patient on ergonomics to prevent assymetrical pain/tension with patient verbalizing understanding.     Rehab Potential  Good    Clinical Impairments Affecting Rehab Potential  (+) motivation (-) age, other comorbities, sedentary lifestyle    PT Frequency  2x / week    PT Treatment/Interventions  ADLs/Self Care Home Management;Aquatic Therapy;Electrical Stimulation;Cryotherapy;Iontophoresis 4mg /ml Dexamethasone;Moist Heat;Traction;Ultrasound;Therapeutic exercise;Therapeutic activities;Neuromuscular re-education;Patient/family  education;Manual techniques;Passive range of motion;Dry needling;Taping;Joint Manipulations    PT Next Visit Plan  Tension relief, periscapular assessment and strengthening    PT Home Exercise Plan  UT stretch, SCM stretch, doorway pec stretch, scap retraction    Consulted and Agree with Plan of Care  Patient       Patient will benefit from skilled therapeutic intervention in order to improve the following deficits and impairments:  Decreased endurance, Decreased range of motion, Decreased strength, Increased fascial restricitons, Impaired UE functional use, Improper body mechanics, Pain, Postural dysfunction, Impaired flexibility, Increased muscle spasms, Decreased mobility  Visit Diagnosis: Cervicalgia     Problem List Patient Active Problem List   Diagnosis Date Noted  . Preventative health care 05/09/2018  . Lung nodule, solitary 05/09/2018  . IBS (irritable bowel syndrome) 11/14/2017  . Sciatic nerve pain, right 11/14/2017  . Personal history of kidney stones 11/14/2017  . Essential hypertension, benign 08/11/2016  . Overweight (BMI 25.0-29.9) 08/11/2016  . Heartburn 08/11/2016  . Varicella exposure 04/24/2015  . Osteopenia    Shelton Silvas PT, DPT Shelton Silvas 07/06/2018, 5:25 PM  [facility] [place] [facility] [address], [place], [address] Phone:  [phone]   Fax:  [phone]  Name: ROWENA MOILANEN MRN: [id number] Date of Birth: 05/21/1956

## 2018-07-11 ENCOUNTER — Ambulatory Visit: Payer: 59 | Attending: Family Medicine | Admitting: Physical Therapy

## 2018-07-11 ENCOUNTER — Encounter: Payer: Self-pay | Admitting: Physical Therapy

## 2018-07-11 DIAGNOSIS — M542 Cervicalgia: Secondary | ICD-10-CM | POA: Diagnosis present

## 2018-07-11 NOTE — Therapy (Signed)
[place] [facility] [address] [place], [address] Phone: [phone]   Fax:  [phone]  Physical Therapy Treatment  Patient Details  Name: Alexandria Hill MRN: [id number] Date of Birth: 09-01-1955 Referring Provider (PT): Lada   Encounter Date: 07/11/2018  PT End of Session - 07/11/18 1649    Visit Number  6    Number of Visits  17    Date for PT Re-Evaluation  08/08/18    PT Start Time  0430    PT Stop Time  0510    PT Time Calculation (min)  40 min    Activity Tolerance  Patient tolerated treatment well    Behavior During Therapy  [facility] for tasks assessed/performed       Past Medical History:  Diagnosis Date  . GERD (gastroesophageal reflux disease)   . Hypertension   . IBS (irritable bowel syndrome) 2019  . Osteopenia   . Overweight (BMI 25.0-29.9) 08/11/2016  . Reflux   . Renal calculi 07/2017    Past Surgical History:  Procedure Laterality Date  . CESAREAN SECTION  1987  . COLONOSCOPY WITH PROPOFOL N/A 12/05/2014   Procedure: COLONOSCOPY WITH PROPOFOL;  Surgeon: Lucilla Lame, MD;  Location: [facility];  Service: Endoscopy;  Laterality: N/A;  . EXTRACORPOREAL SHOCK WAVE LITHOTRIPSY Left 07/14/2017   Procedure: EXTRACORPOREAL SHOCK WAVE LITHOTRIPSY (ESWL);  Surgeon: Abbie Sons, MD;  Location: [facility];  Service: Urology;  Laterality: Left;    There were no vitals filed for this visit.  Subjective Assessment - 07/11/18 1635    Subjective  Patient reports she has been sleeping on her L side which had made a difference, with decreased pain since last visit. Reports she did have a "bad day Sunday" but that her pain has been 1/10 since. Patient reports she thinks ESTIM and heat last session "really helped her pain" Reports compliance with HEP with no questions or concerns.     Pertinent History  Patient is a 63 year female reporting to PT with post neck pain since Nov, with insideous onset that has  been worsening since. Patient reports over Christmas when she had a break from the office her pain slightly improved. 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 Patient reports increased pain on L post neck that runs up to her jaw. Reports R neck pain is more along UT. Does report some radiating pain "between shoulder blades" Worst pain over the past week 4/10 worst 0/10. Pt denies N/V, unexplained weight fluctuation, B&B changes, saddle paresthesia, fever, night sweats, or unrelenting night pain at this time.    Limitations  Lifting;House hold activities    How long can you sit comfortably?  unlimited    How long can you stand comfortably?  unlimited    How long can you walk comfortably?  unlimited    Diagnostic tests  None     Patient Stated Goals  Decrease pain    Pain Onset  1 to 4 weeks ago         Manual - STM withtrigger point releasetoLUT, levator, andSCMFollowing:Dry Needling: (3)45mm .30needles placed along theLUTto decrease increased muscular spasms and trigger points with the patient positioned in supine, utilizing pincer grasp. Patient was educated on risks and benefits of therapy and verbally consents to PT. - Cervical traction x10 10sec traction, 10sec relax  - Grade I-II CPA C0-C2 mobs 30sec bouts 4 bouts each segment  for pain modulation  ESTIM+ heat packHiVolt ESTIM10 min at patient tolerated140Vincreased to180V through treatmentat L UT/leavatorarea. Attempted to decrease pain and muscle tension. With PT assessing patient tolerance throughout (increasing intensity as needed), monitoring skin integrity (normal), with decreased pain noted from patient.   Ther-Ex -Supine serratus punch 3# 3x 10 with demo and max cuing initially for proper form with eventual correct form and good carry over - YTI 3x 10 in each position with max TC initially for proper form with scap retraction without shoulder hiking  with good carry over following  ROM at the beginning of session wnl for all cervical motions except R lateral bending with "pulling at L UT"; following full cervical ROM in all conditions                    PT Education - 07/11/18 1649    Education Details  Exercise form    Person(s) Educated  Patient    Methods  Explanation;Demonstration;Tactile cues;Verbal cues    Comprehension  Verbalized understanding;Returned demonstration;Verbal cues required;Tactile cues required       PT Short Term Goals - 06/13/18 1754      PT SHORT TERM GOAL #1   Title  Pt will be independent with HEP in order to improve strength and decrease cervical pain in order to improve pain-free function at home and work.      Time  4    Period  Weeks    Status  New        PT Long Term Goals - 06/13/18 1754      PT LONG TERM GOAL #1   Title  Pt will decrease worst neck pain as reported on NPRS by at least 2 points in order to demonstrate clinically significant reduction in back pain.     Baseline  06/13/18 4/10    Time  8    Period  Weeks    Status  New      PT LONG TERM GOAL #2   Title  Patient will increase FOTO score to 71 to demonstrate predicted increase in functional mobility to complete ADLs    Baseline  06/13/18 63    Time  8    Period  Weeks    Status  New      PT LONG TERM GOAL #3   Title  Patient will demonstrate full active cervical ROM in order to complete ADLs and demonstrate driving safety    Baseline  06/13/18 see eval    Time  8    Period  Weeks    Status  New            Plan - 07/11/18 1709    Clinical Impression Statement  Patient is making good progress with decreased pain and increased ROM, allowing for therex progression for downward scapula rotators. Patient is able to complete all therex with accuracy folloiwng PT cuing, with heavy cuing required to prevent shoulder hiking compensation. PT will continue progression as pain and ROM allow.     Clinical  Presentation  Evolving    Clinical Decision Making  Moderate    Rehab Potential  Good    Clinical Impairments Affecting Rehab Potential  (+) motivation (-) age, other comorbities, sedentary lifestyle    PT Frequency  2x / week    PT Duration  8 weeks    PT Treatment/Interventions  ADLs/Self Care Home Management;Aquatic Therapy;Electrical Stimulation;Cryotherapy;Iontophoresis 4mg /ml Dexamethasone;Moist Heat;Traction;Ultrasound;Therapeutic exercise;Therapeutic activities;Neuromuscular re-education;Patient/family education;Manual techniques;Passive range of motion;Dry needling;Taping;Joint Manipulations  PT Next Visit Plan  Tension relief, periscapular assessment and strengthening    PT Home Exercise Plan  UT stretch, SCM stretch, doorway pec stretch, scap retraction    Consulted and Agree with Plan of Care  Patient       Patient will benefit from skilled therapeutic intervention in order to improve the following deficits and impairments:  Decreased endurance, Decreased range of motion, Decreased strength, Increased fascial restricitons, Impaired UE functional use, Improper body mechanics, Pain, Postural dysfunction, Impaired flexibility, Increased muscle spasms, Decreased mobility  Visit Diagnosis: Cervicalgia     Problem List Patient Active Problem List   Diagnosis Date Noted  . Preventative health care 05/09/2018  . Lung nodule, solitary 05/09/2018  . IBS (irritable bowel syndrome) 11/14/2017  . Sciatic nerve pain, right 11/14/2017  . Personal history of kidney stones 11/14/2017  . Essential hypertension, benign 08/11/2016  . Overweight (BMI 25.0-29.9) 08/11/2016  . Heartburn 08/11/2016  . Varicella exposure 04/24/2015  . Osteopenia    Shelton Silvas PT, DPT Shelton Silvas 07/11/2018, 5:12 PM  [facility] [address] [place], [address] Phone: [phone]   Fax:  [phone]  Name: BLONNIE MASKE MRN: [id number] Date of Birth: [birth year]

## 2018-07-13 ENCOUNTER — Ambulatory Visit: Payer: 59 | Admitting: Physical Therapy

## 2018-07-18 ENCOUNTER — Ambulatory Visit: Payer: 59 | Admitting: Physical Therapy

## 2018-07-18 ENCOUNTER — Encounter: Payer: 59 | Admitting: Physical Therapy

## 2018-07-18 ENCOUNTER — Encounter: Payer: Self-pay | Admitting: Physical Therapy

## 2018-07-18 DIAGNOSIS — M542 Cervicalgia: Secondary | ICD-10-CM | POA: Diagnosis not present

## 2018-07-18 NOTE — Therapy (Signed)
[facility] [address], [place], [address] Phone: [phone]   Fax:  [phone]  Physical Therapy Treatment  Patient Details  Name: Alexandria Hill MRN: [id number] Date of Birth: 01/17/1956 Referring Provider (PT): Lada   Encounter Date: 07/18/2018  PT End of Session - 07/18/18 1701    Visit Number  7    Number of Visits  17    Date for PT Re-Evaluation  08/08/18    PT Start Time  0445    PT Stop Time  0530    PT Time Calculation (min)  45 min    Activity Tolerance  Patient tolerated treatment well    Behavior During Therapy  [facility] for tasks assessed/performed       Past Medical History:  Diagnosis Date  . GERD (gastroesophageal reflux disease)   . Hypertension   . IBS (irritable bowel syndrome) 2019  . Osteopenia   . Overweight (BMI 25.0-29.9) 08/11/2016  . Reflux   . Renal calculi 07/2017    Past Surgical History:  Procedure Laterality Date  . CESAREAN SECTION  1987  . COLONOSCOPY WITH PROPOFOL N/A 12/05/2014   Procedure: COLONOSCOPY WITH PROPOFOL;  Surgeon: Lucilla Lame, MD;  Location: [facility] ENDOSCOPY;  Service: Endoscopy;  Laterality: N/A;  . EXTRACORPOREAL SHOCK WAVE LITHOTRIPSY Left 07/14/2017   Procedure: EXTRACORPOREAL SHOCK WAVE LITHOTRIPSY (ESWL);  Surgeon: Abbie Sons, MD;  Location: [facility] ORS;  Service: Urology;  Laterality: Left;    There were no vitals filed for this visit.  Subjective Assessment - 07/18/18 1648    Subjective  Patient reports her pain is steadily getting better with worst pain over te weekend 2/10 following carrying heavy bag. Reports 1/10 shoulder pain today. COmpliance with HEP no questions or concerns.     Pertinent History  Patient is a 63 year female reporting to PT with post neck pain since Nov, with insideous onset that has been worsening since. Patient reports over Christmas when she had a break from the office her pain slightly improved. 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 Patient reports increased pain on L post neck that runs up to her jaw. Reports R neck pain is more along UT. Does report some radiating pain "between shoulder blades" Worst pain over the past week 4/10 worst 0/10. Pt denies N/V, unexplained weight fluctuation, B&B changes, saddle paresthesia, fever, night sweats, or unrelenting night pain at this time.    Limitations  Lifting;House hold activities    How long can you sit comfortably?  unlimited    How long can you stand comfortably?  unlimited    How long can you walk comfortably?  unlimited    Diagnostic tests  None     Patient Stated Goals  Decrease pain    Pain Onset  1 to 4 weeks ago         Manual - STM withtrigger point releasetoLUT, levator, andSCMFollowing:Dry Needling: (3)11mm .30needles placed along theLUTto decrease increased muscular spasms and trigger points with the patient positioned in supine, utilizing pincer grasp. Patient was educated on risks and benefits of therapy and verbally consents to PT. - Cervical traction x10 10sec traction, 10sec relax   ESTIM+ heat packHiVolt ESTIM10 min at patient tolerated150Vincreased to200V through treatmentatLUT/leavatorarea. Attemptedto decrease pain and muscle tension. With PT assessing patient tolerance throughout (increasing intensity as needed), monitoring skin integrity (normal), with decreased pain noted from patient.  Ther-Ex -Standing rows 15# x10 with cuing initially for proper form with full scapular retraction with good carry over following - Lat pulldown 20# x10; 25# 2x 10 with min TC initially for proper form with good carry over following - High rows 10# x10; 15# 2x 10 with cuing iniitially for set up with good carry over following - Seated Scaption 2# DB 3x 10 with max cuing initially to maintain scapular retraction with good carry over following.                         PT Education - 07/18/18 1701    Education Details  Exercise form    Person(s) Educated  Patient    Methods  Explanation;Verbal cues    Comprehension  Verbalized understanding;Verbal cues required       PT Short Term Goals - 06/13/18 1754      PT SHORT TERM GOAL #1   Title  Pt will be independent with HEP in order to improve strength and decrease cervical pain in order to improve pain-free function at home and work.      Time  4    Period  Weeks    Status  New        PT Long Term Goals - 06/13/18 1754      PT LONG TERM GOAL #1   Title  Pt will decrease worst neck pain as reported on NPRS by at least 2 points in order to demonstrate clinically significant reduction in back pain.     Baseline  06/13/18 4/10    Time  8    Period  Weeks    Status  New      PT LONG TERM GOAL #2   Title  Patient will increase FOTO score to 71 to demonstrate predicted increase in functional mobility to complete ADLs    Baseline  06/13/18 63    Time  8    Period  Weeks    Status  New      PT LONG TERM GOAL #3   Title  Patient will demonstrate full active cervical ROM in order to complete ADLs and demonstrate driving safety    Baseline  06/13/18 see eval    Time  8    Period  Weeks    Status  New            Plan - 07/18/18 1733    Clinical Impression Statement  Patient is decreasing pain each session, which she is happy with. PT is able to progress therex this session d/t decreased apin. Patient is able to complete all therex with accuracy following PT cuing and demo. PT will continue progression as able. Patient reports she feels better following modalities and thinks this "helps her from getting sore". PT continued modality treatment for patient comfort this session.     Rehab Potential  Good    Clinical Impairments Affecting Rehab Potential  (+) motivation (-) age, other comorbities, sedentary lifestyle    PT Frequency  2x / week    PT Duration  8  weeks    PT Treatment/Interventions  ADLs/Self Care Home Management;Aquatic Therapy;Electrical Stimulation;Cryotherapy;Iontophoresis 4mg /ml Dexamethasone;Moist Heat;Traction;Ultrasound;Therapeutic exercise;Therapeutic activities;Neuromuscular re-education;Patient/family education;Manual techniques;Passive range of motion;Dry needling;Taping;Joint Manipulations    PT Next Visit Plan  Tension relief, periscapular assessment and strengthening    PT Home Exercise Plan  UT stretch, SCM stretch, doorway pec stretch, scap retraction    Consulted and Agree with Plan of Care  Patient       Patient will benefit from skilled therapeutic intervention in order to improve the following deficits and impairments:  Decreased endurance, Decreased range of motion, Decreased strength, Increased fascial restricitons, Impaired UE functional use, Improper body mechanics, Pain, Postural dysfunction, Impaired flexibility, Increased muscle spasms, Decreased mobility  Visit Diagnosis: Cervicalgia     Problem List Patient Active Problem List   Diagnosis Date Noted  . Preventative health care 05/09/2018  . Lung nodule, solitary 05/09/2018  . IBS (irritable bowel syndrome) 11/14/2017  . Sciatic nerve pain, right 11/14/2017  . Personal history of kidney stones 11/14/2017  . Essential hypertension, benign 08/11/2016  . Overweight (BMI 25.0-29.9) 08/11/2016  . Heartburn 08/11/2016  . Varicella exposure 04/24/2015  . Osteopenia    Current Outpatient Medications  Medication Sig Dispense Refill  . aspirin 81 MG tablet Take 81 mg by mouth daily.    . Calcium Citrate-Vitamin D 200-250 MG-UNIT TABS Take by mouth daily.    Marland Kitchen lisinopril (PRINIVIL,ZESTRIL) 10 MG tablet Take 1 tablet (10 mg) once daily in the morning. 90 tablet 3  . Multiple Vitamin (MULTIVITAMIN) capsule Take 1 capsule by mouth daily.    . vitamin C (ASCORBIC ACID) 500 MG tablet Take 500 mg by mouth daily.     No current facility-administered  medications for this visit.    Shelton Silvas 07/18/2018, 5:35 PM  [facility] [address], [place], [address] Phone: [phone]   Fax:  [phone]  Name: Alexandria Hill MRN: [id number] Date of Birth: [birth year]

## 2018-07-20 ENCOUNTER — Encounter: Payer: Self-pay | Admitting: Physical Therapy

## 2018-07-20 ENCOUNTER — Ambulatory Visit: Payer: 59 | Admitting: Physical Therapy

## 2018-07-20 DIAGNOSIS — M542 Cervicalgia: Secondary | ICD-10-CM

## 2018-07-20 NOTE — Therapy (Signed)
[facility] [address], [place], [address] Phone: [phone]   Fax:  [phone]  Physical Therapy Treatment/DC Summary  Patient Details  Name: Alexandria Hill MRN: [id number] Date of Birth: 11-02-1955 Referring Provider (PT): Lada   Encounter Date: 07/20/2018  PT End of Session - 07/20/18 1637    Visit Number  8    Number of Visits  17    Date for PT Re-Evaluation  08/08/18    PT Start Time  0430    PT Stop Time  0456    PT Time Calculation (min)  26 min    Activity Tolerance  Patient tolerated treatment well    Behavior During Therapy  [facility] for tasks assessed/performed       Past Medical History:  Diagnosis Date  . GERD (gastroesophageal reflux disease)   . Hypertension   . IBS (irritable bowel syndrome) 2019  . Osteopenia   . Overweight (BMI 25.0-29.9) 08/11/2016  . Reflux   . Renal calculi 07/2017    Past Surgical History:  Procedure Laterality Date  . CESAREAN SECTION  1987  . COLONOSCOPY WITH PROPOFOL N/A 12/05/2014   Procedure: COLONOSCOPY WITH PROPOFOL;  Surgeon: Lucilla Lame, MD;  Location: [facility] ENDOSCOPY;  Service: Endoscopy;  Laterality: N/A;  . EXTRACORPOREAL SHOCK WAVE LITHOTRIPSY Left 07/14/2017   Procedure: EXTRACORPOREAL SHOCK WAVE LITHOTRIPSY (ESWL);  Surgeon: Abbie Sons, MD;  Location: [facility] ORS;  Service: Urology;  Laterality: Left;    There were no vitals filed for this visit.  Subjective Assessment - 07/20/18 1632    Subjective  Patient reports minimal cervical pain today "not even a 1/10". Patient reports compliance with HEP with no questions or concerns. Reports she feels good to graduate PT to HEP    Pertinent History  Patient is a 63 year female reporting to PT with post neck pain since Nov, with insideous onset that has been worsening since. Patient reports over Christmas when she had a break from the office her pain slightly improved. 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 Patient reports increased pain on L post neck that runs up to her jaw. Reports R neck pain is more along UT. Does report some radiating pain "between shoulder blades" Worst pain over the past week 4/10 worst 0/10. Pt denies N/V, unexplained weight fluctuation, B&B changes, saddle paresthesia, fever, night sweats, or unrelenting night pain at this time.    Limitations  Lifting;House hold activities    How long can you sit comfortably?  unlimited    How long can you stand comfortably?  unlimited    How long can you walk comfortably?  unlimited    Diagnostic tests  None     Patient Stated Goals  Decrease pain    Pain Onset  1 to 4 weeks ago       Ther-Ex - ROM assessment; patient demonstrating full pain free ROM - Education on HEP stretching and strengthening parameters with information on rep/set ration for strengthening, frequency, how to increase/decrease intensity with demonstration of 2 sets of the following with min cuing for set up and good carry over following:      Prone YTI 2x 10 in each position, min cuing initially for proper scapular contraction High row GTB with min cuing for set up Rows GTB with good carry over from previous sessions Doorway pec stretch 54mn Levator stretch 175m UT stretch 72m672m  PT Education - 07/20/18 1636    Education Details  HEP and DC recommendations    Person(s) Educated  Patient    Methods  Explanation;Demonstration;Handout;Verbal cues    Comprehension  Verbalized understanding;Returned demonstration;Verbal cues required       PT Short Term Goals - 07/20/18 1638      PT SHORT TERM GOAL #1   Title  Pt will be independent with HEP in order to improve strength and decrease cervical pain in order to improve pain-free function at home and work.      Time  4    Period  Weeks    Status  Achieved        PT Long Term Goals - 07/20/18  1634      PT LONG TERM GOAL #1   Title  Pt will decrease worst neck pain as reported on NPRS by at least 2 points in order to demonstrate clinically significant reduction in back pain.     Baseline  07/20/18 1/10    Time  8    Period  Weeks    Status  Achieved      PT LONG TERM GOAL #2   Title  Patient will increase FOTO score to 71 to demonstrate predicted increase in functional mobility to complete ADLs    Baseline  07/20/18 76    Time  8    Period  Weeks    Status  Achieved      PT LONG TERM GOAL #3   Title  Patient will demonstrate full active cervical ROM in order to complete ADLs and demonstrate driving safety    Baseline  07/20/18 full pain free cervical ROM    Time  8    Period  Weeks    Status  Achieved            Plan - 07/21/18 1053    Clinical Impression Statement  Patient has met all PT goals at this time to DC safely to HEP. PT and patient reviewed HEP with parameters for progression which patient verbalized and demonstrated understanding of. Patient given clinic contact infor should any further questions/concerns arise.     Rehab Potential  Good    Clinical Impairments Affecting Rehab Potential  (+) motivation (-) age, other comorbities, sedentary lifestyle    PT Frequency  2x / week    PT Duration  8 weeks    PT Treatment/Interventions  ADLs/Self Care Home Management;Aquatic Therapy;Electrical Stimulation;Cryotherapy;Iontophoresis 4m/ml Dexamethasone;Moist Heat;Traction;Ultrasound;Therapeutic exercise;Therapeutic activities;Neuromuscular re-education;Patient/family education;Manual techniques;Passive range of motion;Dry needling;Taping;Joint Manipulations    PT Next Visit Plan  Tension relief, periscapular assessment and strengthening    PT Home Exercise Plan  UT stretch, SCM stretch, doorway pec stretch, scap retraction    Consulted and Agree with Plan of Care  Patient       Patient will benefit from skilled therapeutic intervention in order to improve the  following deficits and impairments:  Decreased endurance, Decreased range of motion, Decreased strength, Increased fascial restricitons, Impaired UE functional use, Improper body mechanics, Pain, Postural dysfunction, Impaired flexibility, Increased muscle spasms, Decreased mobility  Visit Diagnosis: Cervicalgia     Problem List Patient Active Problem List   Diagnosis Date Noted  . Preventative health care 05/09/2018  . Lung nodule, solitary 05/09/2018  . IBS (irritable bowel syndrome) 11/14/2017  . Sciatic nerve pain, right 11/14/2017  . Personal history of kidney stones 11/14/2017  . Essential hypertension, benign 08/11/2016  . Overweight (BMI 25.0-29.9) 08/11/2016  . Heartburn 08/11/2016  .  Varicella exposure 04/24/2015  . Osteopenia    Shelton Silvas PT, DPT Shelton Silvas 07/21/2018, 11:01 AM  [facility] [address] [place], [address] Phone: [phone]   Fax:  [phone]  Name: Alexandria Hill MRN: [id number] Date of Birth: 07-Mar-1956

## 2018-07-21 ENCOUNTER — Encounter: Payer: Self-pay | Admitting: Physical Therapy

## 2018-07-25 ENCOUNTER — Ambulatory Visit: Payer: 59 | Admitting: Physical Therapy

## 2018-07-27 ENCOUNTER — Ambulatory Visit: Payer: 59 | Admitting: Physical Therapy

## 2018-08-01 ENCOUNTER — Encounter: Payer: 59 | Admitting: Physical Therapy

## 2018-08-03 ENCOUNTER — Encounter: Payer: 59 | Admitting: Physical Therapy

## 2018-08-11 DIAGNOSIS — Z85828 Personal history of other malignant neoplasm of skin: Secondary | ICD-10-CM | POA: Diagnosis not present

## 2018-08-11 DIAGNOSIS — D2262 Melanocytic nevi of left upper limb, including shoulder: Secondary | ICD-10-CM | POA: Diagnosis not present

## 2018-08-11 DIAGNOSIS — D225 Melanocytic nevi of trunk: Secondary | ICD-10-CM | POA: Diagnosis not present

## 2018-09-03 IMAGING — CR DG ABDOMEN 1V
1 series · 2 of 2 positions shown · non-contrast
Comparison: None.

CLINICAL DATA: Followup left-sided kidney stone.

EXAM:
ABDOMEN - 1 VIEW

[Series 1: dg abd 1 view · 0.14mm/px · 2 of 2 slices shown]
[im 1/2]
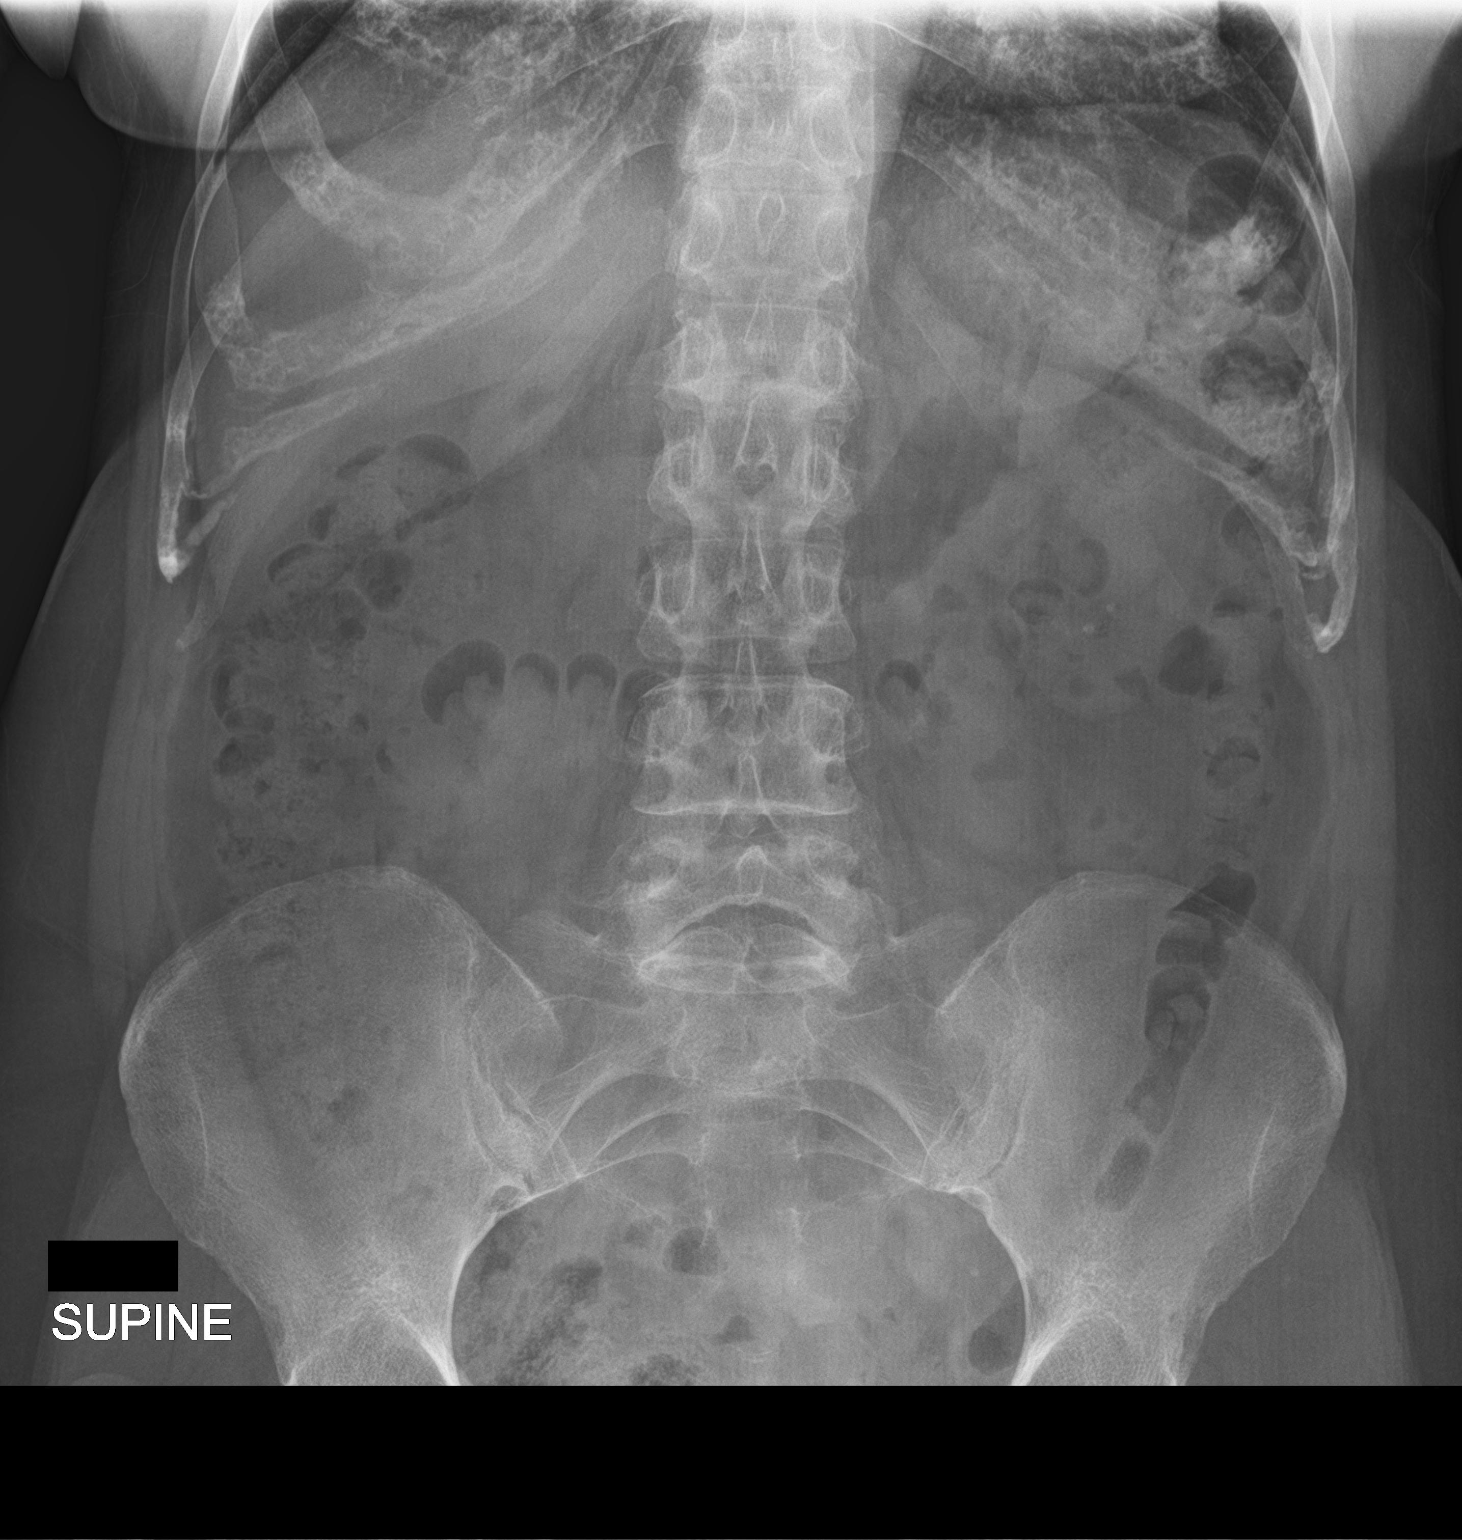
[im 2/2]
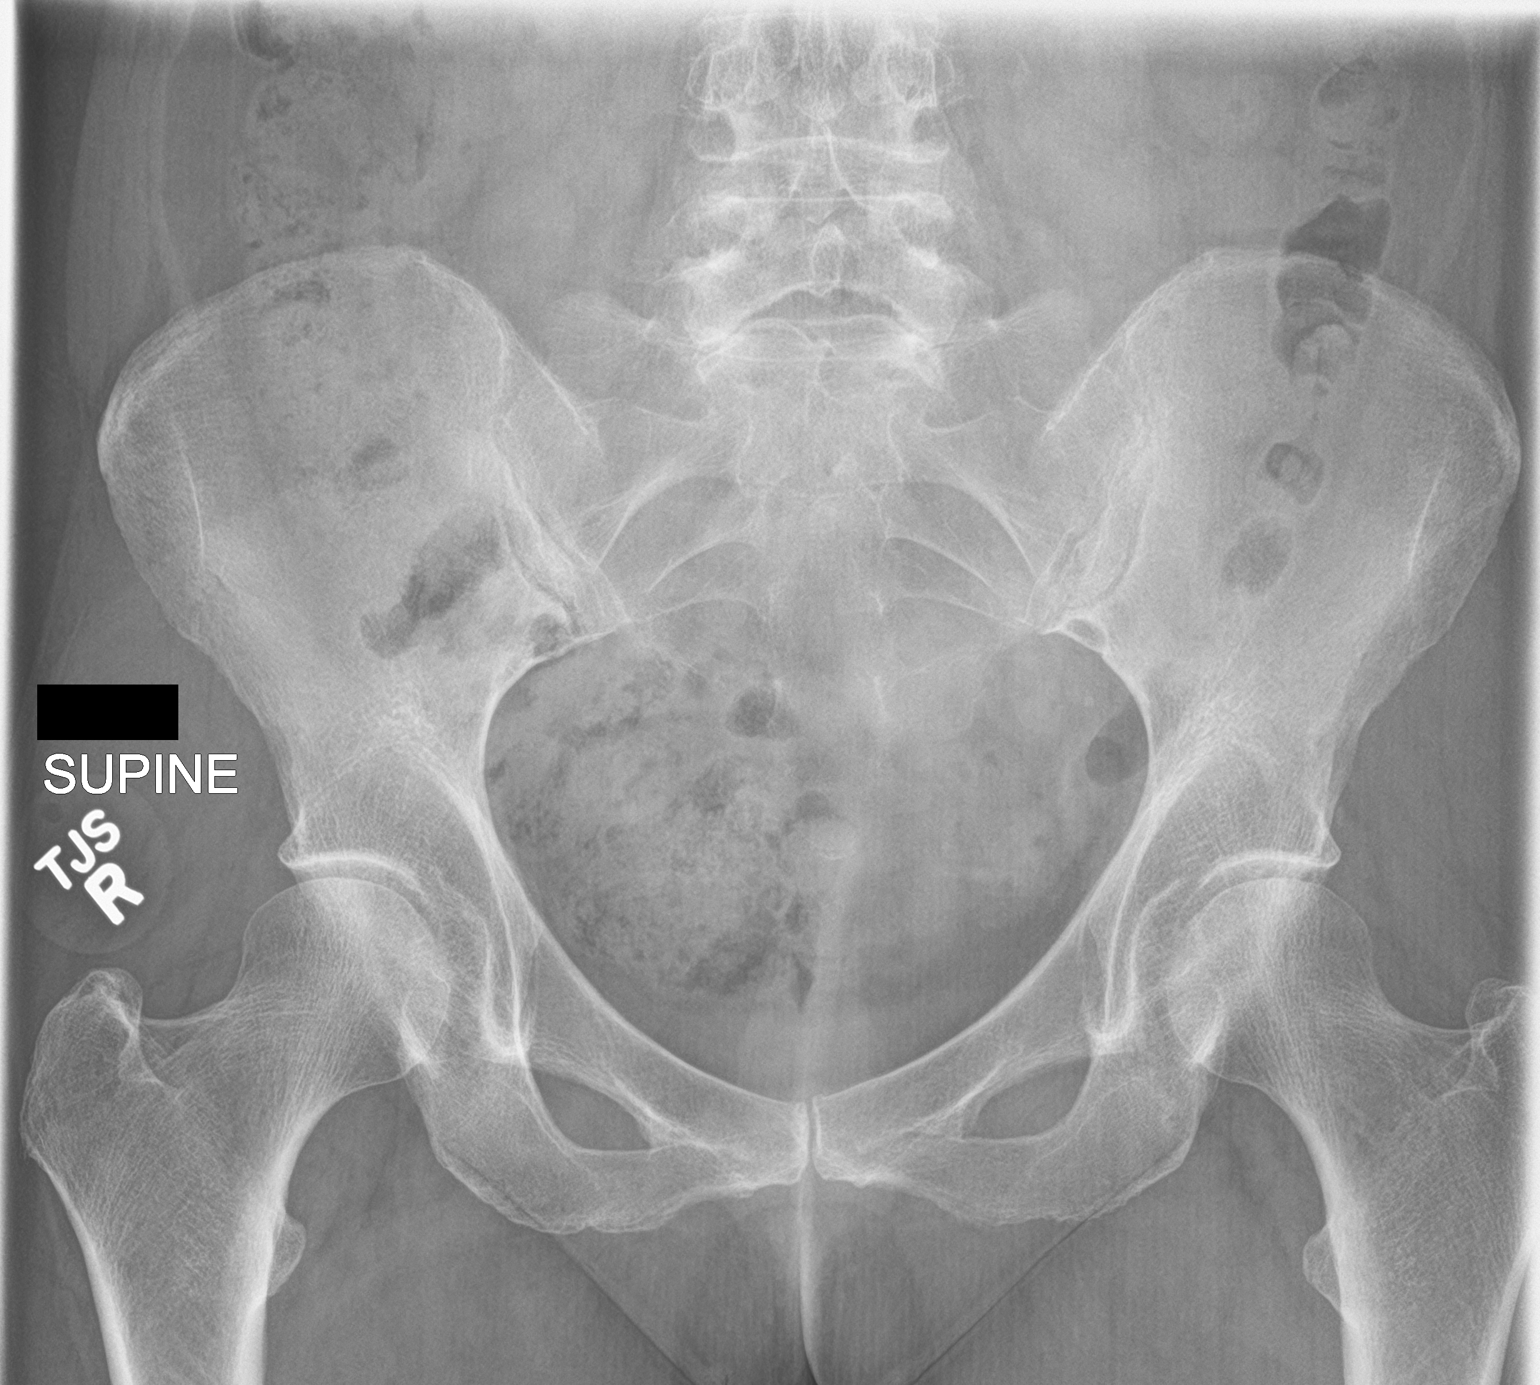

[2 of 2 positions shown; findings below may reference images not displayed]

FINDINGS: The bowel gas pattern is unremarkable. Small lower pole left renal
calculi are noted. No obvious ureteral or bladder calculi.
IMPRESSION: Stable small lower pole left renal calculi without obvious ureteral
or bladder calculi.

## 2018-11-15 ENCOUNTER — Ambulatory Visit: Payer: 59 | Admitting: Nurse Practitioner

## 2018-11-15 ENCOUNTER — Other Ambulatory Visit: Payer: Self-pay

## 2018-11-15 VITALS — BP 128/74 | HR 83 | Temp 98.3°F | Resp 98 | Ht 63.0 in | Wt 149.4 lb

## 2018-11-15 DIAGNOSIS — N3090 Cystitis, unspecified without hematuria: Secondary | ICD-10-CM

## 2018-11-15 DIAGNOSIS — R399 Unspecified symptoms and signs involving the genitourinary system: Secondary | ICD-10-CM

## 2018-11-15 LAB — POCT URINALYSIS DIPSTICK
Appearance: NORMAL
Bilirubin, UA: NEGATIVE
Blood, UA: NEGATIVE
Glucose, UA: NEGATIVE
Nitrite, UA: NEGATIVE
Protein, UA: NEGATIVE
Spec Grav, UA: >=1.03 — AB (ref 1.010–1.025)
Urobilinogen, UA: 0.2 E.U./dL
pH, UA: 5 (ref 5.0–8.0)

## 2018-11-15 MED ORDER — SULFAMETHOXAZOLE-TRIMETHOPRIM 800-160 MG PO TABS: 1.0000 | ORAL_TABLET | Freq: Two times a day (BID) | ORAL | 0 refills | Status: DC

## 2018-11-15 NOTE — Progress Notes (Signed)
Name: Alexandria Hill   MRN: [id number]    DOB: Apr 18, 1956   Date:11/15/2018       Progress Note  Subjective  Chief Complaint  Chief Complaint  Patient presents with  . Urinary Tract Infection    Lower back pain. Pt states that she has a history of kidney stones    HPI  Patient endorses right flank pain and intermittent rate 4-5/10. Is noticing she wakes up in the night to urinate the past few days. Has urinary frequency but drinks a lot of water. Patient states her symptoms have been ongoing for the last 2 weeks. Pain is relieved with ibuprofen. States when she has previously felt like this it has been due to UTI's. Does have a history of kidney stones as well.   Denies dysuria, urgency or suprapubic pressure.   PHQ2/9: Depression screen [facility] 2/9 11/15/2018 05/09/2018 03/30/2018 05/02/2017 10/08/2016  Decreased Interest 0 0 0 0 0  Down, Depressed, Hopeless 0 0 0 0 0  PHQ - 2 Score 0 0 0 0 0  Altered sleeping 0 0 0 - -  Tired, decreased energy 0 0 0 - -  Change in appetite 0 0 0 - -  Feeling bad or failure about yourself  0 0 0 - -  Trouble concentrating 0 0 0 - -  Moving slowly or fidgety/restless 0 0 0 - -  Suicidal thoughts 0 0 0 - -  PHQ-9 Score 0 0 0 - -  Difficult doing work/chores Not difficult at all Not difficult at all Not difficult at all - -     PHQ reviewed. Negative  Patient Active Problem List   Diagnosis Date Noted  . Preventative health care 05/09/2018  . Lung nodule, solitary 05/09/2018  . IBS (irritable bowel syndrome) 11/14/2017  . Sciatic nerve pain, right 11/14/2017  . Personal history of kidney stones 11/14/2017  . Essential hypertension, benign 08/11/2016  . Overweight (BMI 25.0-29.9) 08/11/2016  . Heartburn 08/11/2016  . Varicella exposure 04/24/2015  . Osteopenia     Past Medical History:  Diagnosis Date  . GERD (gastroesophageal reflux disease)   . Hypertension   . IBS (irritable bowel syndrome) 2019  . Osteopenia   . Overweight (BMI  25.0-29.9) 08/11/2016  . Reflux   . Renal calculi 07/2017    Past Surgical History:  Procedure Laterality Date  . CESAREAN SECTION  1987  . COLONOSCOPY WITH PROPOFOL N/A 12/05/2014   Procedure: COLONOSCOPY WITH PROPOFOL;  Surgeon: Lucilla Lame, MD;  Location: [facility] ENDOSCOPY;  Service: Endoscopy;  Laterality: N/A;  . EXTRACORPOREAL SHOCK WAVE LITHOTRIPSY Left 07/14/2017   Procedure: EXTRACORPOREAL SHOCK WAVE LITHOTRIPSY (ESWL);  Surgeon: Abbie Sons, MD;  Location: [facility] ORS;  Service: Urology;  Laterality: Left;    Social History   Tobacco Use  . Smoking status: Former Smoker    Packs/day: 0.50    Years: 10.00    Pack years: 5.00    Types: Cigarettes    Last attempt to quit: 04/23/1985    Years since quitting: 33.5  . Smokeless tobacco: Never Used  Substance Use Topics  . Alcohol use: Yes    Comment: occasional     Current Outpatient Medications:  .  Calcium Citrate-Vitamin D 200-250 MG-UNIT TABS, Take by mouth daily., Disp: , Rfl:  .  lisinopril (PRINIVIL,ZESTRIL) 10 MG tablet, Take 1 tablet (10 mg) once daily in the morning., Disp: 90 tablet, Rfl: 3 .  Multiple Vitamin (MULTIVITAMIN) capsule, Take 1 capsule by mouth daily.,  Disp: , Rfl:  .  vitamin C (ASCORBIC ACID) 500 MG tablet, Take 500 mg by mouth daily., Disp: , Rfl:   No Known Allergies  ROS   No other specific complaints in a complete review of systems (except as listed in HPI above).  Objective  Vitals:   11/15/18 1431  BP: 128/74  Pulse: 83  Resp: (!) 98  Temp: 98.3 F (36.8 C)  TempSrc: Oral  Weight: 149 lb 6.4 oz (67.8 kg)  Height: 5\' 3"  (1.6 m)    Body mass index is 26.47 kg/m.  Nursing Note and Vital Signs reviewed.  Physical Exam Constitutional:      Appearance: Normal appearance.  HENT:     Head: Normocephalic and atraumatic.  Cardiovascular:     Rate and Rhythm: Normal rate.  Pulmonary:     Effort: Pulmonary effort is normal.  Abdominal:     General: Abdomen is flat. Bowel  sounds are normal.     Tenderness: There is no abdominal tenderness. There is no right CVA tenderness or left CVA tenderness.     Comments: Pain worse with bending   Skin:    General: Skin is warm and dry.  Neurological:     General: No focal deficit present.     Mental Status: She is alert and oriented to person, place, and time.  Psychiatric:        Mood and Affect: Mood normal.        Behavior: Behavior normal.       No results found for this or any previous visit (from the past 48 hour(s)).  Assessment & Plan  1. UTI symptoms - POCT Urinalysis Dipstick - Urine Culture  2. Cystitis - Urine Culture - sulfamethoxazole-trimethoprim (BACTRIM DS) 800-160 MG tablet; Take 1 tablet by mouth 2 (two) times daily.  Dispense: 10 tablet; Refill: 0  If pain worsens ROC

## 2018-11-15 NOTE — Patient Instructions (Addendum)
-   Please call your insurance company to ensure coverage of shingrix and prenvar 13 and pnemovax 23  Please take your antibiotic as prescribed with food on your stomach to prevent nausea and upset stomach. Take the antibiotic for the entire course, even if your symptoms resolve before the course if completed. Using antibiotics inappropriately can make it harder to treat future infections. If you have any concerning side effects please stop the medication and let us know immediately. I do recommend taking a probiotic to replenish your good gut health anytime you take an antibiotic. You can get probiotics in types of yogurt like activia, or other food/drinks such as kimchi, kombucha , sauerkraut, or you can take an over the counter supplement.   Your symptoms should begin to improve within a day of starting antibiotics. But you should finish all the antibiotic pills you get to ensure the bacteria is killed and prevent antibiotic resistance.  If you are having pain when you pee, you can also take a medicine to numb your bladder. Look for Phenazopyridine (Pyridium or AZO) over the counter at your pharmacy. This medicine eases the pain caused by urinary tract infections. It also reduces the need to urinate.  If you frequently get UTI's here are some prevention tips:  - Avoiding spermicides  - Drinking more fluid - This can help prevent bladder infections. ?Urinating right after sex - Some doctors think this helps, because it helps flush out germs that might get into the bladder during sex. There is no proof it works, but it also cannot hurt. ?Vaginal estrogen - If you are a woman who has already been through menopause, your doctor might suggest this. Vaginal estrogen comes in a cream or a flexible ring that you put into your vagina. It can help prevent bladder infections. ?Antibiotics - If you get a lot of bladder infections, and the above methods have not helped, your doctor might give you antibiotics to help  prevent infection. But taking antibiotics has downsides, so doctors usually suggest trying other things first   The studies suggesting that cranberry products prevent bladder infections are not very good. Other studies suggest that cranberry products do not prevent bladder infections. But if you want to try cranberry products for this purpose, there is probably not much harm in doing so.

## 2018-11-16 LAB — URINE CULTURE
MICRO NUMBER:: 556672
SPECIMEN QUALITY:: ADEQUATE

## 2019-01-23 ENCOUNTER — Telehealth: Payer: Self-pay | Admitting: Urology

## 2019-01-23 DIAGNOSIS — Z87442 Personal history of urinary calculi: Secondary | ICD-10-CM

## 2019-01-23 NOTE — Telephone Encounter (Signed)
Patient has an app with you next week I need an order for a KUB please   Thanks, Sharyn Lull

## 2019-01-25 ENCOUNTER — Encounter: Payer: Self-pay | Admitting: Family Medicine

## 2019-01-25 DIAGNOSIS — Z23 Encounter for immunization: Secondary | ICD-10-CM

## 2019-01-29 ENCOUNTER — Other Ambulatory Visit: Payer: Self-pay

## 2019-01-29 ENCOUNTER — Ambulatory Visit
Admission: RE | Admit: 2019-01-29 | Discharge: 2019-01-29 | Disposition: A | Discharge status: Home / Self Care | Payer: 59 | Source: Ambulatory Visit | Attending: Urology | Admitting: Urology

## 2019-01-29 ENCOUNTER — Encounter: Payer: Self-pay | Admitting: Urology

## 2019-01-29 ENCOUNTER — Ambulatory Visit (INDEPENDENT_AMBULATORY_CARE_PROVIDER_SITE_OTHER): Payer: 59 | Admitting: Urology

## 2019-01-29 ENCOUNTER — Ambulatory Visit
Admission: RE | Admit: 2019-01-29 | Discharge: 2019-01-29 | Disposition: A | Discharge status: Home / Self Care | Payer: 59 | Attending: Urology | Admitting: Urology

## 2019-01-29 VITALS — BP 124/72 | HR 82 | Ht 63.0 in | Wt 150.0 lb

## 2019-01-29 DIAGNOSIS — Z87442 Personal history of urinary calculi: Secondary | ICD-10-CM | POA: Diagnosis not present

## 2019-01-29 DIAGNOSIS — N390 Urinary tract infection, site not specified: Secondary | ICD-10-CM

## 2019-01-29 NOTE — Progress Notes (Signed)
01/29/2019 10:45 AM   [place] February 02, 1956 [id number]  Referring provider: Arnetha Courser, MD [address] [place],  [place] [address]  Chief Complaint  Patient presents with  . Nephrolithiasis    1year w/KUB    Urologic history: 1.  Nephrolithiasis  -Episode left renal colic secondary to 2 mm UPJ calculus 06/2017-passed  -CT showed 4 and 5 mm nonobstructing left lower pole renal calculi and she elected shockwave lithotripsy performed [id number]  -Metabolic evaluation without significant abnormalities.  2.  Recurrent UTI   HPI: 63 y.o. female presents for annual follow-up.  Since her visit last year she has been asymptomatic and denies flank, abdominal or pelvic pain.  She was treated for 1 UTI in the last 12 months.  She has no complaints today.   PMH: Past Medical History:  Diagnosis Date  . GERD (gastroesophageal reflux disease)   . Hypertension   . IBS (irritable bowel syndrome) 2019  . Osteopenia   . Overweight (BMI 25.0-29.9) 08/11/2016  . Reflux   . Renal calculi 07/2017    Surgical History: Past Surgical History:  Procedure Laterality Date  . CESAREAN SECTION  1987  . COLONOSCOPY WITH PROPOFOL N/A 12/05/2014   Procedure: COLONOSCOPY WITH PROPOFOL;  Surgeon: Lucilla Lame, MD;  Location: [facility] END[facility];  Service: Endoscopy;  Laterality: N/A;  . EXTRACORPOREAL SHOCK WAVE LITHOTRIPSY Left 07/14/2017   Procedure: EXTRACORPOREAL SHOCK WAVE LITHOTRIPSY (ESWL);  Surgeon: Abbie Sons, MD;  Location: [facility] ORS;  Service: Urology;  Laterality: Left;    Home Medications:  Allergies as of 01/29/2019   No Known Allergies     Medication List       Accurate as of January 29, 2019 10:45 AM. If you have any questions, ask your nurse or doctor.        STOP taking these medications   sulfamethoxazole-trimethoprim 800-160 MG tablet Commonly known as: BACTRIM DS Stopped by: Abbie Sons, MD   vitamin C 500 MG tablet Commonly known as: ASCORBIC ACID  Stopped by: Abbie Sons, MD     TAKE these medications   Calcium Citrate-Vitamin D 200-250 MG-UNIT Tabs Take by mouth daily.   lisinopril 10 MG tablet Commonly known as: ZESTRIL Take 1 tablet (10 mg) once daily in the morning.   multivitamin capsule Take 1 capsule by mouth daily.       Allergies: No Known Allergies  Family History: Family History  Problem Relation Age of Onset  . Aneurysm Mother        brain  . Hypertension Mother   . Stroke Mother        possibly mini strokes  . Heart disease Father   . Heart attack Father   . Mental retardation Father   . Cancer Brother   . Diabetes Maternal Aunt   . Hypertension Sister   . Obesity Sister   . Mental illness Sister   . Cancer Maternal Grandmother   . Hypertension Sister   . Mental illness Sister   . Depression Sister   . Hypertension Sister   . Depression Sister   . COPD Neg Hx   . Breast cancer Neg Hx     Social History:  reports that she quit smoking about 33 years ago. Her smoking use included cigarettes. She has a 5.00 pack-year smoking history. She has never used smokeless tobacco. She reports current alcohol use. She reports that she does not use drugs.  ROS: UROLOGY Frequent Urination?: No Hard to postpone  urination?: No Burning/pain with urination?: No Get up at night to urinate?: Yes Leakage of urine?: No Urine stream starts and stops?: No Trouble starting stream?: No Do you have to strain to urinate?: No Blood in urine?: No Urinary tract infection?: No Sexually transmitted disease?: No Injury to kidneys or bladder?: No Painful intercourse?: No Weak stream?: No Currently pregnant?: No Vaginal bleeding?: No Last menstrual period?: n  Gastrointestinal Nausea?: No Vomiting?: No Indigestion/heartburn?: No Diarrhea?: No Constipation?: No  Constitutional Fever: No Night sweats?: No Weight loss?: No Fatigue?: No  Skin Skin rash/lesions?: No Itching?: No  Eyes Blurred  vision?: No Double vision?: No  Ears/Nose/Throat Sore throat?: No Sinus problems?: No  Hematologic/Lymphatic Swollen glands?: No Easy bruising?: No  Cardiovascular Leg swelling?: No Chest pain?: No  Respiratory Cough?: No Shortness of breath?: No  Endocrine Excessive thirst?: No  Musculoskeletal Back pain?: No Joint pain?: No  Neurological Headaches?: No Dizziness?: No  Psychologic Depression?: No Anxiety?: No  Physical Exam: BP 124/72   Pulse 82   Ht 5\' 3"  (1.6 m)   Wt 150 lb (68 kg)   BMI 26.57 kg/m   Constitutional:  Alert and oriented, No acute distress. HEENT: [place] AT, moist mucus membranes.  Trachea midline, no masses. Cardiovascular: No clubbing, cyanosis, or edema. Respiratory: Normal respiratory effort, no increased work of breathing. Skin: No rashes, bruises or suspicious lesions. Neurologic: Grossly intact, no focal deficits, moving all 4 extremities. Psychiatric: Normal mood and affect.   Assessment & Plan:    - Nephrolithiasis KUB ordered and she will be notified with results.  Continue annual follow-up.  - Recurrent UTI Only one symptomatic infection last 12 months.   Abbie Sons, [place] [address] [place], [place] [address] [phone]

## 2019-01-30 ENCOUNTER — Telehealth: Payer: Self-pay

## 2019-01-30 DIAGNOSIS — N390 Urinary tract infection, site not specified: Secondary | ICD-10-CM | POA: Insufficient documentation

## 2019-01-30 NOTE — Telephone Encounter (Signed)
mychart sent.

## 2019-01-30 NOTE — Telephone Encounter (Signed)
-----   Message from Abbie Sons, MD sent at 01/30/2019  8:16 AM EDT ----- KUB reviewed-no calculi seen

## 2019-03-06 ENCOUNTER — Other Ambulatory Visit: Payer: Self-pay

## 2019-03-06 ENCOUNTER — Ambulatory Visit (INDEPENDENT_AMBULATORY_CARE_PROVIDER_SITE_OTHER): Payer: 59

## 2019-03-06 DIAGNOSIS — Z23 Encounter for immunization: Secondary | ICD-10-CM | POA: Diagnosis not present

## 2019-03-28 ENCOUNTER — Ambulatory Visit: Payer: Self-pay | Admitting: Family Medicine

## 2019-03-28 NOTE — Telephone Encounter (Signed)
Pt reports left "Upper leg, near groin" pain. Onset 2 months ago. States pain is intermittent, "Seens like it gets better then comes back." Does not radiate. States is able to ambulate but "Cannot do my usual walk for exercise when it occurs." Denies any swelling, no redness, does not recall any injury. Denies any swelling felt with palpation. Also reports "Popping and cracking below knee at times." Call transferred to practice, Suanne Marker, for consideration of appt. Care advise given per protocol. Pt verbalizes understanding. Reason for Disposition . [1] MILD pain (e.g., does not interfere with normal activities) AND [2] present > 7 days  Answer Assessment - Initial Assessment Questions 1. ONSET: "When did the pain start?"      2 months ago 2. LOCATION: "Where is the pain located?"      Left groin 3. PAIN: "How bad is the pain?"    (Scale 1-10; or mild, moderate, severe)   -  MILD (1-3): doesn't interfere with normal activities    -  MODERATE (4-7): interferes with normal activities (e.g., work or school) or awakens from sleep, limping    -  SEVERE (8-10): excruciating pain, unable to do any normal activities, unable to walk     Intermittent, moderate when occurs 4. WORK OR EXERCISE: "Has there been any recent work or exercise that involved this part of the body?"      no 5. CAUSE: "What do you think is causing the leg pain?"     unsure 6. OTHER SYMPTOMS: "Do you have any other symptoms?" (e.g., chest pain, back pain, breathing difficulty, swelling, rash, fever, numbness, weakness)    Back pain 2011-12, always have that.  Protocols used: LEG PAIN-A-AH

## 2019-03-29 ENCOUNTER — Encounter: Payer: Self-pay | Admitting: Family Medicine

## 2019-03-29 ENCOUNTER — Ambulatory Visit: Payer: 59 | Admitting: Family Medicine

## 2019-03-29 ENCOUNTER — Other Ambulatory Visit: Payer: Self-pay

## 2019-03-29 VITALS — BP 124/80 | HR 84 | Temp 96.9°F | Resp 16 | Ht 63.0 in | Wt 152.6 lb

## 2019-03-29 DIAGNOSIS — M25552 Pain in left hip: Secondary | ICD-10-CM | POA: Diagnosis not present

## 2019-03-29 DIAGNOSIS — G8929 Other chronic pain: Secondary | ICD-10-CM | POA: Diagnosis not present

## 2019-03-29 NOTE — Telephone Encounter (Signed)
Needs appt

## 2019-03-29 NOTE — Progress Notes (Signed)
Name: [place]   MRN: [id number]    DOB: 07-14-1955   Date:03/29/2019       Progress Note  Subjective  Chief Complaint  Chief Complaint  Patient presents with  . Hip Pain    HPI  Left Hip pain: Mrs. Klinck states she has been going for daily walks of about 20-30 minutes for the past few years, however about 6-8 weeks ago she noticed a pain on left groin that was aggravated by activity and certain movements of the hip and sometimes a popping sound. She denies problems getting up from sitting position, she has a history of sciatica but it was on right side and not currently having back pain. Denies personal history of OA. She states she has a constant mild ache but worse at times. Taking Aleve or Tylenol prn a couple of times a week    Patient Active Problem List   Diagnosis Date Noted  . Recurrent UTI 01/30/2019  . Preventative health care 05/09/2018  . Lung nodule, solitary 05/09/2018  . IBS (irritable bowel syndrome) 11/14/2017  . Sciatic nerve pain, right 11/14/2017  . Personal history of urinary calculi 11/14/2017  . Essential hypertension, benign 08/11/2016  . Overweight (BMI 25.0-29.9) 08/11/2016  . Heartburn 08/11/2016  . Varicella exposure 04/24/2015  . Osteopenia     Past Surgical History:  Procedure Laterality Date  . CESAREAN SECTION  1987  . COLONOSCOPY WITH PROPOFOL N/A 12/05/2014   Procedure: COLONOSCOPY WITH PROPOFOL;  Surgeon: Lucilla Lame, MD;  Location: [facility];  Service: Endoscopy;  Laterality: N/A;  . EXTRACORPOREAL SHOCK WAVE LITHOTRIPSY Left 07/14/2017   Procedure: EXTRACORPOREAL SHOCK WAVE LITHOTRIPSY (ESWL);  Surgeon: Abbie Sons, MD;  Location: [facility];  Service: Urology;  Laterality: Left;    Family History  Problem Relation Age of Onset  . Aneurysm Mother        brain  . Hypertension Mother   . Stroke Mother        possibly mini strokes  . Heart disease Father   . Heart attack Father   . Mental retardation Father   . Cancer  Brother   . Diabetes Maternal Aunt   . Hypertension Sister   . Obesity Sister   . Mental illness Sister   . Cancer Maternal Grandmother   . Hypertension Sister   . Mental illness Sister   . Depression Sister   . Hypertension Sister   . Depression Sister   . COPD Neg Hx   . Breast cancer Neg Hx     Social History   Socioeconomic History  . Marital status: Divorced    Spouse name: Not on file  . Number of children: 2  . Years of education: 52  . Highest education level: Not on file  Occupational History  . Not on file  Social Needs  . Financial resource strain: Somewhat hard  . Food insecurity    Worry: Never true    Inability: Never true  . Transportation needs    Medical: No    Non-medical: No  Tobacco Use  . Smoking status: Former Smoker    Packs/day: 0.50    Years: 10.00    Pack years: 5.00    Types: Cigarettes    Quit date: 04/23/1985    Years since quitting: 33.9  . Smokeless tobacco: Never Used  Substance and Sexual Activity  . Alcohol use: Yes    Comment: occasional  . Drug use: No  . Sexual activity: Not Currently  Lifestyle  . Physical activity    Days per week: 3 days    Minutes per session: 20 min  . Stress: Not at all  Relationships  . Social Herbalist on phone: Three times a week    Gets together: Once a week    Attends religious service: More than 4 times per year    Active member of club or organization: No    Attends meetings of clubs or organizations: Never    Relationship status: Divorced  . Intimate partner violence    Fear of current or ex partner: No    Emotionally abused: No    Physically abused: No    Forced sexual activity: No  Other Topics Concern  . Not on file  Social History Narrative  . Not on file     Current Outpatient Medications:  .  lisinopril (PRINIVIL,ZESTRIL) 10 MG tablet, Take 1 tablet (10 mg) once daily in the morning., Disp: 90 tablet, Rfl: 3 .  Multiple Vitamin (MULTIVITAMIN) capsule, Take 1  capsule by mouth daily., Disp: , Rfl:  .  Calcium Citrate-Vitamin D 200-250 MG-UNIT TABS, Take by mouth daily., Disp: , Rfl:   No Known Allergies  I personally reviewed active problem list, medication list, allergies, family history, social history with the patient/caregiver today.   ROS  Ten systems reviewed and is negative except as mentioned in HPI   Objective  Vitals:   03/29/19 1131  BP: 124/80  Pulse: 84  Resp: 16  Temp: (!) 96.9 F (36.1 C)  TempSrc: Temporal  SpO2: 97%  Weight: 152 lb 9.6 oz (69.2 kg)  Height: 5\' 3"  (1.6 m)    Body mass index is 27.03 kg/m.  Physical Exam  Constitutional: Patient appears well-developed and well-nourished. Overweight  No distress.  HEENT: head atraumatic, normocephalic, pupils equal and reactive to light Cardiovascular: Normal rate, regular rhythm and normal heart sounds.  No murmur heard. No BLE edema. Pulmonary/Chest: Effort normal and breath sounds normal. No respiratory distress. Abdominal: Soft.  There is no tenderness. Muscular Skeletal: pain with internal rotation of left hip, normal rom of spine, no pain during palpation of spine Psychiatric: Patient has a normal mood and affect. behavior is normal. Judgment and thought content normal.  PHQ2/9: Depression screen [facility] 2/9 03/29/2019 11/15/2018 05/09/2018 03/30/2018 05/02/2017  Decreased Interest 0 0 0 0 0  Down, Depressed, Hopeless 0 0 0 0 0  PHQ - 2 Score 0 0 0 0 0  Altered sleeping 0 0 0 0 -  Tired, decreased energy 0 0 0 0 -  Change in appetite 0 0 0 0 -  Feeling bad or failure about yourself  0 0 0 0 -  Trouble concentrating 0 0 0 0 -  Moving slowly or fidgety/restless 0 0 0 0 -  Suicidal thoughts 0 0 0 0 -  PHQ-9 Score 0 0 0 0 -  Difficult doing work/chores - Not difficult at all Not difficult at all Not difficult at all -    phq 9 is positive   Fall Risk: Fall Risk  03/29/2019 11/15/2018 05/09/2018 03/30/2018 05/02/2017  Falls in the past year? 0 1 0 No No   Number falls in past yr: 0 1 0 - -  Injury with Fall? 0 0 - - -     Functional Status Survey: Is the patient deaf or have difficulty hearing?: No Does the patient have difficulty seeing, even when wearing glasses/contacts?: No Does the patient have difficulty  concentrating, remembering, or making decisions?: No Does the patient have difficulty walking or climbing stairs?: Yes Does the patient have difficulty dressing or bathing?: No Does the patient have difficulty doing errands alone such as visiting a doctor's office or shopping?: No    Assessment & Plan  1. Hip pain, chronic, left  - Ambulatory referral to Orthopedic Surgery  She will send me a message with the name of Ortho she would like to see. Explained likely OA of hip, discussed home hip exercises and prn Tylenol and ibuprofen

## 2019-03-29 NOTE — Telephone Encounter (Signed)
Please advise 

## 2019-03-29 NOTE — Patient Instructions (Signed)
Hip Exercises Ask your health care provider which exercises are safe for you. Do exercises exactly as told by your health care provider and adjust them as directed. It is normal to feel mild stretching, pulling, tightness, or discomfort as you do these exercises. Stop right away if you feel sudden pain or your pain gets worse. Do not begin these exercises until told by your health care provider. Stretching and range-of-motion exercises These exercises warm up your muscles and joints and improve the movement and flexibility of your hip. These exercises also help to relieve pain, numbness, and tingling. You may be asked to limit your range of motion if you had a hip replacement. Talk to your health care provider about these restrictions. Hamstrings, supine  1. Lie on your back (supine position). 2. Loop a belt or towel over the ball of your left / right foot. The ball of your foot is on the walking surface, right under your toes. 3. Straighten your left / right knee and slowly pull on the belt or towel to raise your leg until you feel a gentle stretch behind your knee (hamstring). ? Do not let your knee bend while you do this. ? Keep your other leg flat on the floor. 4. Hold this position for __________ seconds. 5. Slowly return your leg to the starting position. Repeat __________ times. Complete this exercise __________ times a day. Hip rotation  1. Lie on your back on a firm surface. 2. With your left / right hand, gently pull your left / right knee toward the shoulder that is on the same side of the body. Stop when your knee is pointing toward the ceiling. 3. Hold your left / right ankle with your other hand. 4. Keeping your knee steady, gently pull your left / right ankle toward your other shoulder until you feel a stretch in your buttocks. ? Keep your hips and shoulders firmly planted while you do this stretch. 5. Hold this position for __________ seconds. Repeat __________ times. Complete  this exercise __________ times a day. Seated stretch This exercise is sometimes called hamstrings and adductors stretch. 1. Sit on the floor with your legs stretched wide. Keep your knees straight during this exercise. 2. Keeping your head and back in a straight line, bend at your waist to reach for your left foot (position A). You should feel a stretch in your right inner thigh (adductors). 3. Hold this position for __________ seconds. Then slowly return to the upright position. 4. Keeping your head and back in a straight line, bend at your waist to reach forward (position B). You should feel a stretch behind both of your thighs and knees (hamstrings). 5. Hold this position for __________ seconds. Then slowly return to the upright position. 6. Keeping your head and back in a straight line, bend at your waist to reach for your right foot (position C). You should feel a stretch in your left inner thigh (adductors). 7. Hold this position for __________ seconds. Then slowly return to the upright position. Repeat __________ times. Complete this exercise __________ times a day. Lunge This exercise stretches the muscles of the hip (hip flexors). 1. Place your left / right knee on the floor and bend your other knee so that is directly over your ankle. You should be half-kneeling. 2. Keep good posture with your head over your shoulders. 3. Tighten your buttocks to point your tailbone downward. This will prevent your back from arching too much. 4. You should feel a   gentle stretch in the front of your left / right thigh and hip. If you do not feel a stretch, slide your other foot forward slightly and then slowly lunge forward with your chest up until your knee once again lines up over your ankle. ? Make sure your tailbone continues to point downward. 5. Hold this position for __________ seconds. 6. Slowly return to the starting position. Repeat __________ times. Complete this exercise __________ times a  day. Strengthening exercises These exercises build strength and endurance in your hip. Endurance is the ability to use your muscles for a long time, even after they get tired. Bridge This exercise strengthens the muscles of your hip (hip extensors). 1. Lie on your back on a firm surface with your knees bent and your feet flat on the floor. 2. Tighten your buttocks muscles and lift your bottom off the floor until the trunk of your body and your hips are level with your thighs. ? Do not arch your back. ? You should feel the muscles working in your buttocks and the back of your thighs. If you do not feel these muscles, slide your feet 1-2 inches (2.5-5 cm) farther away from your buttocks. 3. Hold this position for __________ seconds. 4. Slowly lower your hips to the starting position. 5. Let your muscles relax completely between repetitions. Repeat __________ times. Complete this exercise __________ times a day. Straight leg raises, side-lying This exercise strengthens the muscles that move the hip joint away from the center of the body (hip abductors). 1. Lie on your side with your left / right leg in the top position. Lie so your head, shoulder, hip, and knee line up. You may bend your bottom knee slightly to help you balance. 2. Roll your hips slightly forward, so your hips are stacked directly over each other and your left / right knee is facing forward. 3. Leading with your heel, lift your top leg 4-6 inches (10-15 cm). You should feel the muscles in your top hip lifting. ? Do not let your foot drift forward. ? Do not let your knee roll toward the ceiling. 4. Hold this position for __________ seconds. 5. Slowly return to the starting position. 6. Let your muscles relax completely between repetitions. Repeat __________ times. Complete this exercise __________ times a day. Straight leg raises, side-lying This exercise strengthen the muscles that move the hip joint toward the center of the  body (hip adductors). 1. Lie on your side with your left / right leg in the bottom position. Lie so your head, shoulder, hip, and knee line up. You may place your upper foot in front to help you balance. 2. Roll your hips slightly forward, so your hips are stacked directly over each other and your left / right knee is facing forward. 3. Tense the muscles in your inner thigh and lift your bottom leg 4-6 inches (10-15 cm). 4. Hold this position for __________ seconds. 5. Slowly return to the starting position. 6. Let your muscles relax completely between repetitions. Repeat __________ times. Complete this exercise __________ times a day. Straight leg raises, supine This exercise strengthens the muscles in the front of your thigh (quadriceps). 1. Lie on your back (supine position) with your left / right leg extended and your other knee bent. 2. Tense the muscles in the front of your left / right thigh. You should see your kneecap slide up or see increased dimpling just above your knee. 3. Keep these muscles tight as you raise your   leg 4-6 inches (10-15 cm) off the floor. Do not let your knee bend. 4. Hold this position for __________ seconds. 5. Keep these muscles tense as you lower your leg. 6. Relax the muscles slowly and completely between repetitions. Repeat __________ times. Complete this exercise __________ times a day. Hip abductors, standing This exercise strengthens the muscles that move the leg and hip joint away from the center of the body (hip abductors). 1. Tie one end of a rubber exercise band or tubing to a secure surface, such as a chair, table, or pole. 2. Loop the other end of the band or tubing around your left / right ankle. 3. Keeping your ankle with the band or tubing directly opposite the secured end, step away until there is tension in the tubing or band. Hold on to a chair, table, or pole as needed for balance. 4. Lift your left / right leg out to your side. While you do  this: ? Keep your back upright. ? Keep your shoulders over your hips. ? Keep your toes pointing forward. ? Make sure to use your hip muscles to slowly lift your leg. Do not tip your body or forcefully lift your leg. 5. Hold this position for __________ seconds. 6. Slowly return to the starting position. Repeat __________ times. Complete this exercise __________ times a day. Squats This exercise strengthens the muscles in the front of your thigh (quadriceps). 1. Stand in a door frame so your feet and knees are in line with the frame. You may place your hands on the frame for balance. 2. Slowly bend your knees and lower your hips like you are going to sit in a chair. ? Keep your lower legs in a straight-up-and-down position. ? Do not let your hips go lower than your knees. ? Do not bend your knees lower than told by your health care provider. ? If your hip pain increases, do not bend as low. 3. Hold this position for ___________ seconds. 4. Slowly push with your legs to return to standing. Do not use your hands to pull yourself to standing. Repeat __________ times. Complete this exercise __________ times a day. This information is not intended to replace advice given to you by your health care provider. Make sure you discuss any questions you have with your health care provider. Document Released: 06/11/2005 Document Revised: 04/04/2018 Document Reviewed: 04/04/2018 Elsevier Patient Education  2020 [employer]  

## 2019-03-31 IMAGING — CR DG ABDOMEN 1V
1 series · 2 of 2 positions shown · non-contrast
Comparison: 08/03/2017

CLINICAL DATA: History of left renal calculi and prior lithotripsy

EXAM:
ABDOMEN - 1 VIEW

[Series 1: dg abd 1 view · 0.14mm/px · 2 of 2 slices shown]
[im 1/2]
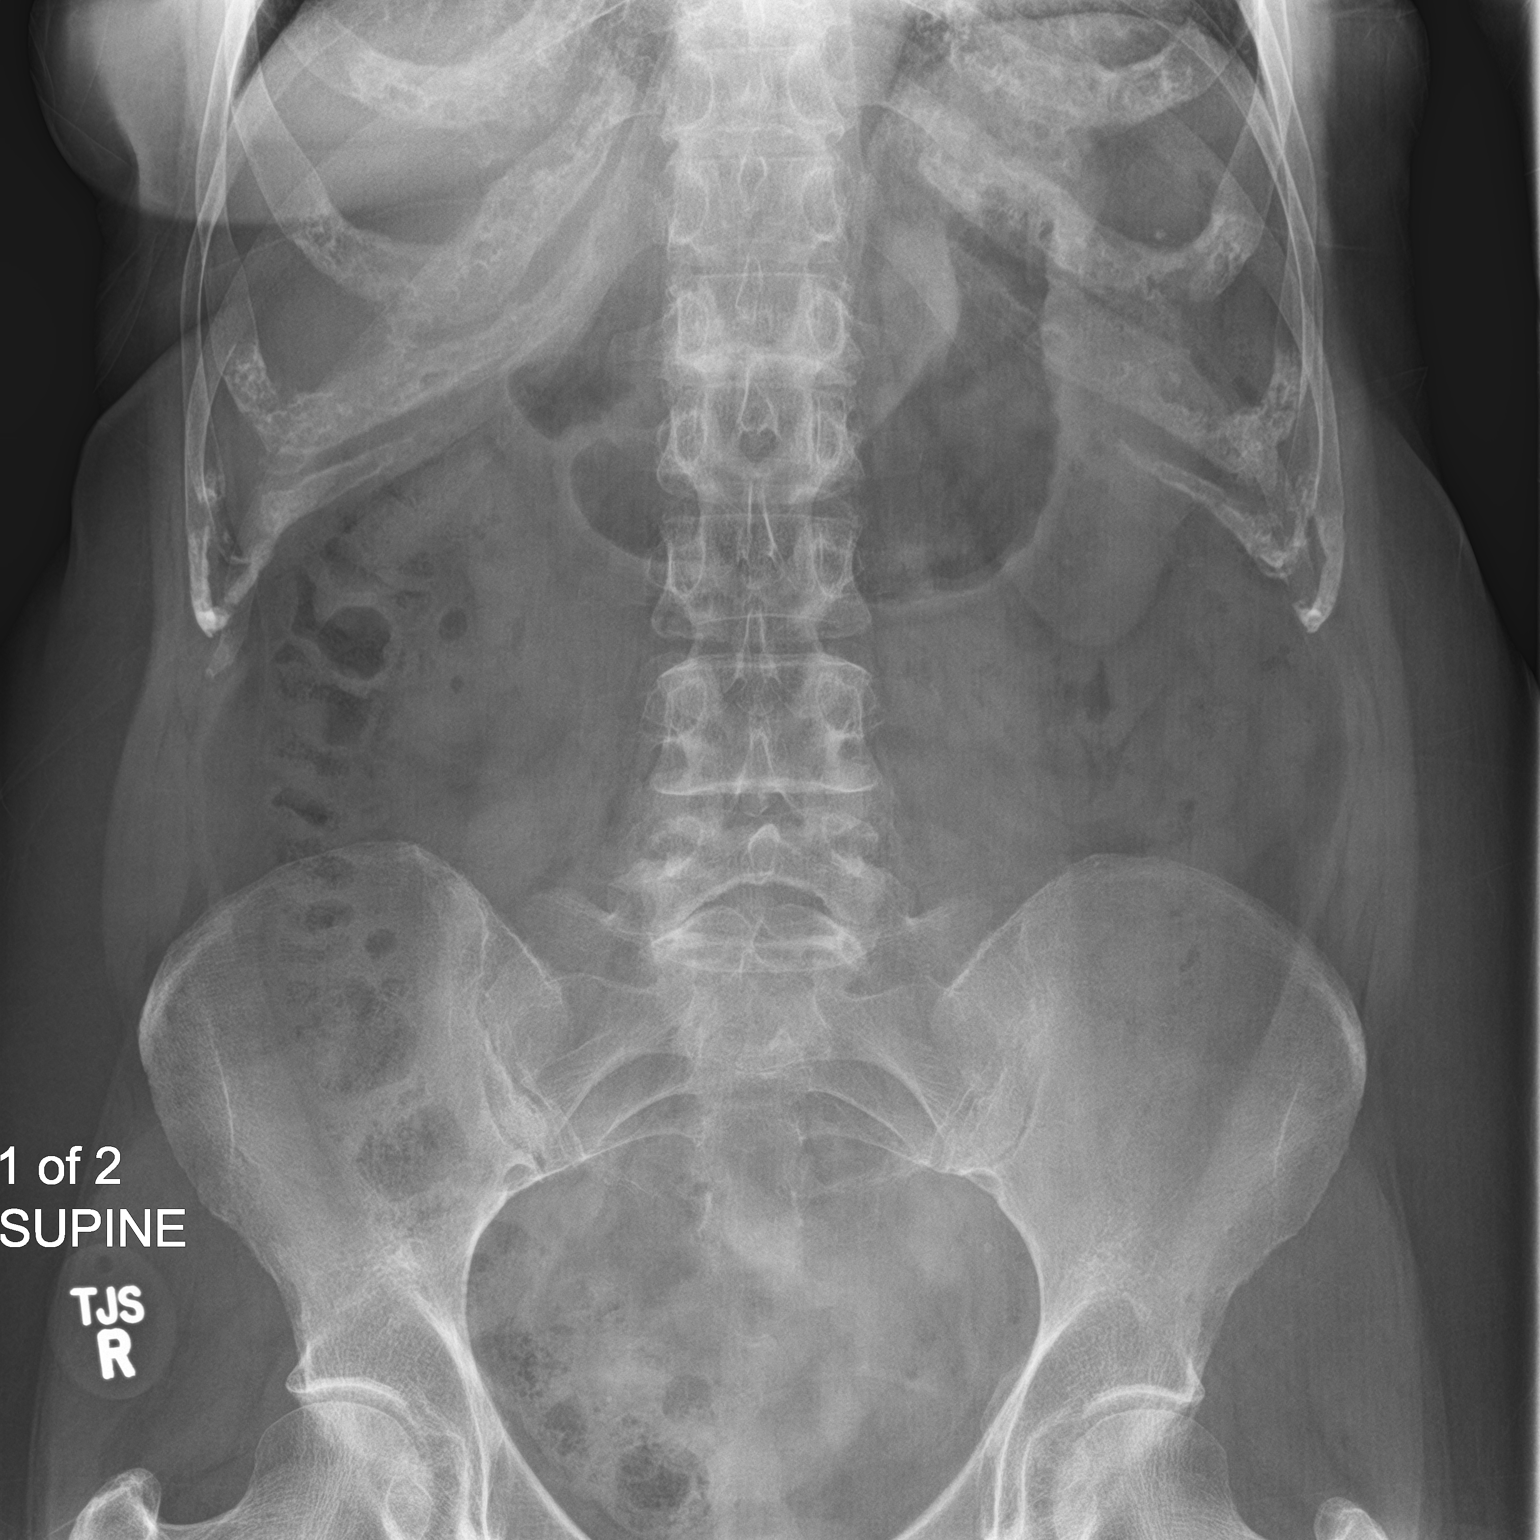
[im 2/2]
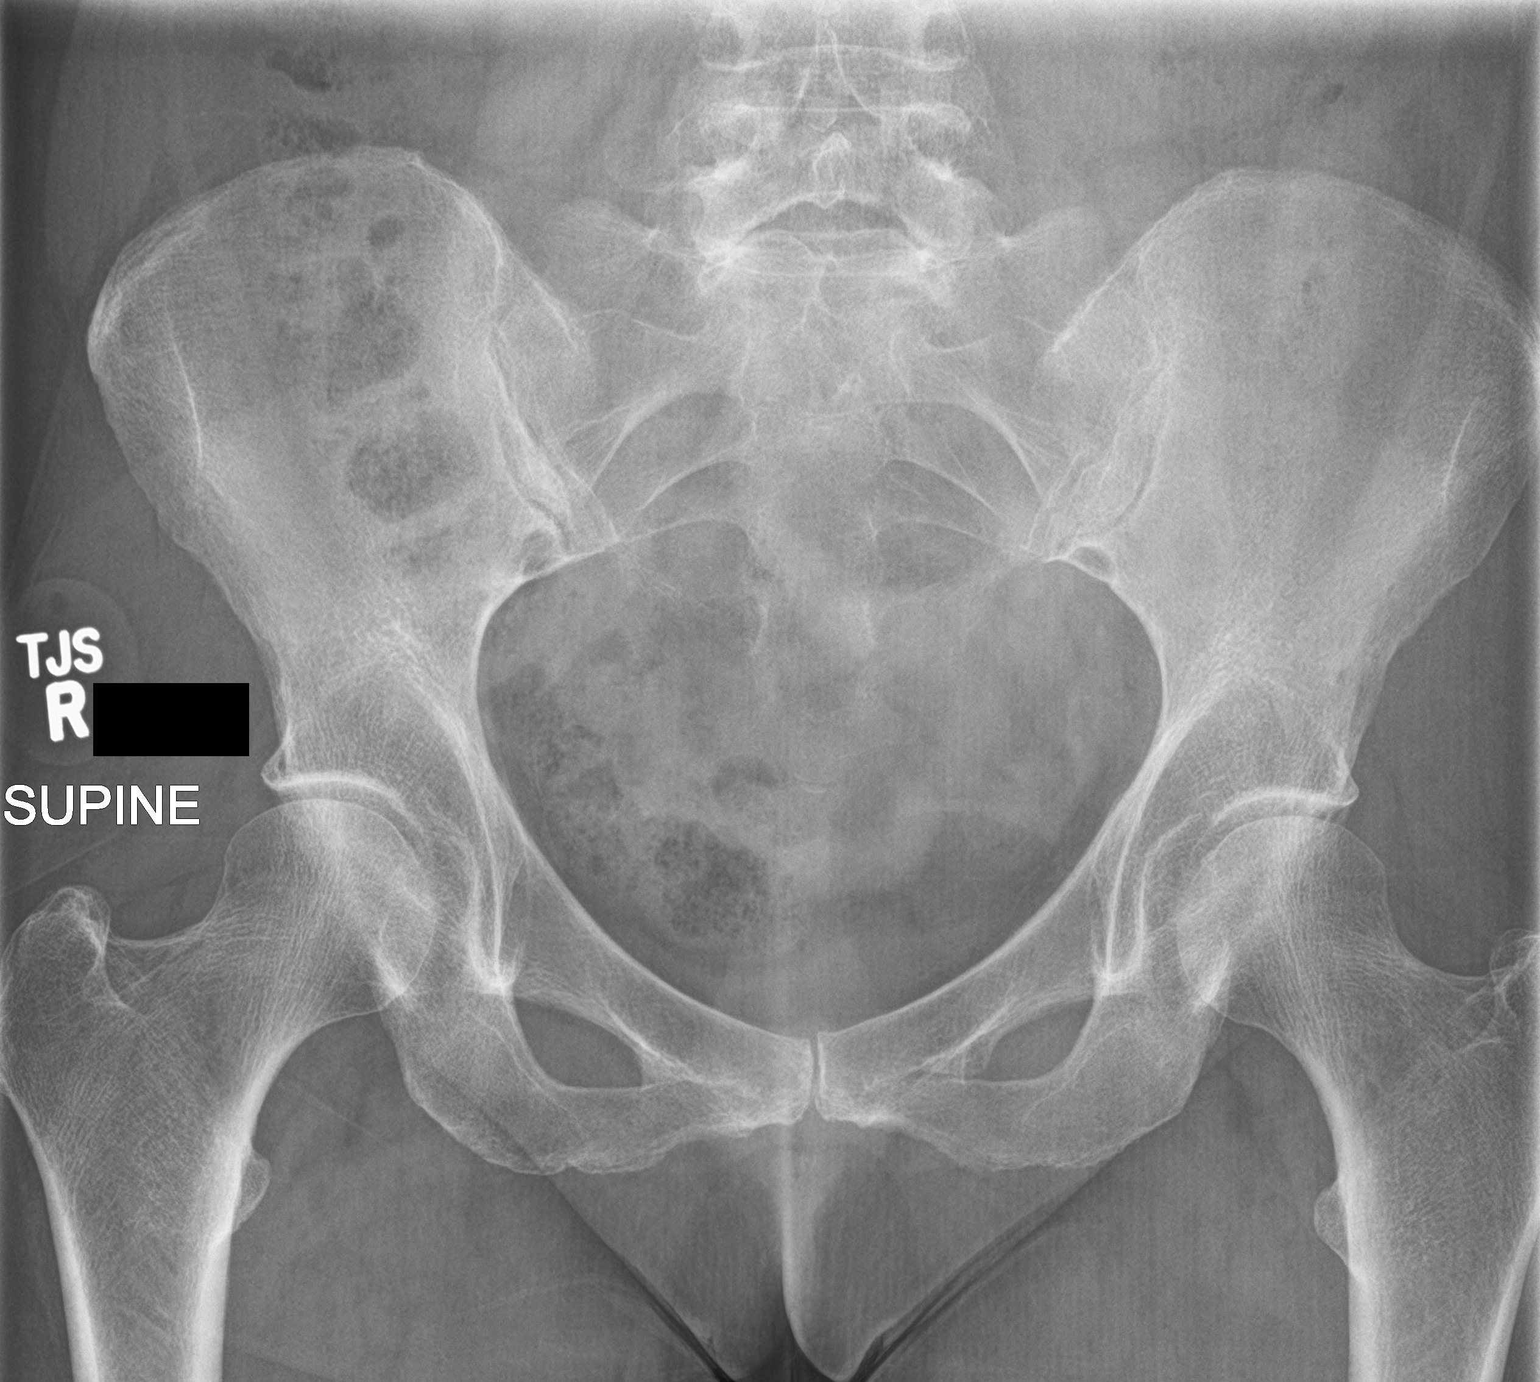

[2 of 2 positions shown; findings below may reference images not displayed]

FINDINGS: Scattered large and small bowel gas is noted. No abnormal mass is
identified. Questionable tiny density is noted in the lower pole of
the left kidney. No definitive ureteral stones are seen. The right
kidney is within normal limits. No bony abnormality is noted.
IMPRESSION: Question tiny stone in the lower pole of the left kidney.

## 2019-04-17 ENCOUNTER — Other Ambulatory Visit: Payer: Self-pay | Admitting: Physician Assistant

## 2019-04-27 DIAGNOSIS — M25552 Pain in left hip: Secondary | ICD-10-CM | POA: Insufficient documentation

## 2019-05-11 ENCOUNTER — Ambulatory Visit (INDEPENDENT_AMBULATORY_CARE_PROVIDER_SITE_OTHER): Payer: 59 | Admitting: Family Medicine

## 2019-05-11 ENCOUNTER — Encounter: Payer: Self-pay | Admitting: Family Medicine

## 2019-05-11 ENCOUNTER — Other Ambulatory Visit: Payer: Self-pay

## 2019-05-11 VITALS — BP 124/68 | HR 83 | Temp 97.6°F | Resp 14 | Ht 63.0 in | Wt 151.0 lb

## 2019-05-11 DIAGNOSIS — Z1159 Encounter for screening for other viral diseases: Secondary | ICD-10-CM

## 2019-05-11 DIAGNOSIS — Z1231 Encounter for screening mammogram for malignant neoplasm of breast: Secondary | ICD-10-CM

## 2019-05-11 DIAGNOSIS — M8588 Other specified disorders of bone density and structure, other site: Secondary | ICD-10-CM

## 2019-05-11 DIAGNOSIS — E7800 Pure hypercholesterolemia, unspecified: Secondary | ICD-10-CM | POA: Diagnosis not present

## 2019-05-11 DIAGNOSIS — Z0000 Encounter for general adult medical examination without abnormal findings: Secondary | ICD-10-CM | POA: Diagnosis not present

## 2019-05-11 DIAGNOSIS — R918 Other nonspecific abnormal finding of lung field: Secondary | ICD-10-CM | POA: Diagnosis not present

## 2019-05-11 DIAGNOSIS — I10 Essential (primary) hypertension: Secondary | ICD-10-CM

## 2019-05-11 DIAGNOSIS — Z131 Encounter for screening for diabetes mellitus: Secondary | ICD-10-CM

## 2019-05-11 NOTE — Patient Instructions (Signed)

## 2019-05-11 NOTE — Progress Notes (Signed)
Name: Alexandria Hill   MRN: [id number]    DOB: 05-05-1956   Date:05/11/2019       Progress Note  Subjective  Chief Complaint  Chief Complaint  Patient presents with  . Annual Exam    HPI  Patient presents for annual CPE.  Diet: Balanced - works on lower fat diet/increased fiber, less red meat due to history elevated cholesterol Exercise:  She is walking 20-20mn 3-5 times a week  USPSTF grade A and B recommendations    [facility]  1    Drinks alcohol seldomly  Depression: Phq 9 is  negative [facility] 05/11/2019 03/29/2019 11/15/2018 05/09/2018 03/30/2018  Decreased Interest 0 0 0 0 0  Down, Depressed, Hopeless 0 0 0 0 0  PHQ - 2 Score 0 0 0 0 0  Altered sleeping 0 0 0 0 0  Tired, decreased energy 0 0 0 0 0  Change in appetite 0 0 0 0 0  Feeling bad or failure about yourself  0 0 0 0 0  Trouble concentrating 0 0 0 0 0  Moving slowly or fidgety/restless 0 0 0 0 0  Suicidal thoughts 0 0 0 0 0  PHQ-9 Score 0 0 0 0 0  Difficult doing work/chores Not difficult at all - Not difficult at all Not difficult at all Not difficult at all   Hypertension: BP Readings from Last 3 Encounters:  05/11/19 124/68  03/29/19 124/80  01/29/19 124/72   Obesity: Wt Readings from Last 3 Encounters:  05/11/19 151 lb (68.5 kg)  03/29/19 152 lb 9.6 oz (69.2 kg)  01/29/19 150 lb (68 kg)   BMI Readings from Last 3 Encounters:  05/11/19 26.75 kg/m  03/29/19 27.03 kg/m  01/29/19 26.57 kg/m     Hep C Screening: We will check today STD testing and prevention (HIV/chl/gon/syphilis): Declines Intimate partner violence: No concners Sexual History/Pain during Intercourse:  No concerns Menstrual History/LMP/Abnormal Bleeding: No concerns Incontinence Symptoms: Occasional - does not want any work up at this time  Breast cancer:  - Last Mammogram: 2019 was normal - due again this year. - BRCA gene screening: No  family history  Osteoporosis: discussed high calcium and vitamin D supplementation.  She has osteopenia - due for repeat DEXA this year.  Cervical cancer screening: Had in 2018 and was normal; she has never had an abnormal.  Will d/c as she is 63yo.  Skin cancer: discussed atypical lesion monitoring.  No concerning lesions today. Sees derm annually for skin survery Colorectal cancer: Going every 5 years; has history polyps.  She is due June 2021. Lung cancer:  Former smoker - pulmonary nodule found in left lower lobe at 457m11/19/2020 - recommended 12 month follow up - we will order today. ECG: Denies chest pain, shortness of breath, of palpitations.  EKG on file from 2019.  Advanced Care Planning: A voluntary discussion about advance care planning including the explanation and discussion of advance directives.  Discussed health care proxy and Living will, and the patient was able to identify a health care proxy as LiJerolyn Centernd MaJulaine Hill Patient does have a living will at present time. If patient does have living will, I have requested they bring this to the clinic to be scanned in to their chart.  Lipids: Lab Results  Component Value Date   CHOL 194 05/09/2018   CHOL 191 03/30/2018   CHOL 181 05/02/2017  Lab Results  Component Value Date   HDL 54 05/09/2018   HDL 55 03/30/2018   HDL 54 05/02/2017   Lab Results  Component Value Date   LDLCALC 121 (H) 05/09/2018   LDLCALC 113 (H) 03/30/2018   LDLCALC 108 (H) 05/02/2017   Lab Results  Component Value Date   TRIG 87 05/09/2018   TRIG 120 03/30/2018   TRIG 101 05/02/2017   Lab Results  Component Value Date   CHOLHDL 3.6 05/09/2018   CHOLHDL 3.5 03/30/2018   CHOLHDL 3.4 05/02/2017   No results found for: LDLDIRECT  Glucose: Glucose  Date Value Ref Range Status  04/10/2018 70 65 - 99 mg/dL Final   Glucose, Bld  Date Value Ref Range Status  05/09/2018 93 65 - 99 mg/dL Final    Comment:    .             Fasting reference interval .   03/31/2018 128 (H) 70 - 99 mg/dL Final  03/28/2018 113 (H) 70 - 99 mg/dL Final    Patient Active Problem List   Diagnosis Date Noted  . Recurrent UTI 01/30/2019  . Preventative health care 05/09/2018  . Lung nodule, solitary 05/09/2018  . IBS (irritable bowel syndrome) 11/14/2017  . Sciatic nerve pain, right 11/14/2017  . Personal history of urinary calculi 11/14/2017  . Essential hypertension, benign 08/11/2016  . Overweight (BMI 25.0-29.9) 08/11/2016  . Heartburn 08/11/2016  . Varicella exposure 04/24/2015  . Osteopenia     Past Surgical History:  Procedure Laterality Date  . CESAREAN SECTION  1987  . COLONOSCOPY WITH PROPOFOL N/A 12/05/2014   Procedure: COLONOSCOPY WITH PROPOFOL;  Surgeon: Lucilla Lame, MD;  Location: [facility] ENDOSCOPY;  Service: Endoscopy;  Laterality: N/A;  . EXTRACORPOREAL SHOCK WAVE LITHOTRIPSY Left 07/14/2017   Procedure: EXTRACORPOREAL SHOCK WAVE LITHOTRIPSY (ESWL);  Surgeon: Abbie Sons, MD;  Location: [facility] ORS;  Service: Urology;  Laterality: Left;    Family History  Problem Relation Age of Onset  . Aneurysm Mother        brain  . Hypertension Mother   . Stroke Mother        possibly mini strokes  . Heart disease Father   . Heart attack Father   . Mental retardation Father   . Cancer Brother   . Diabetes Maternal Aunt   . Hypertension Sister   . Obesity Sister   . Mental illness Sister   . Cancer Maternal Grandmother   . Hypertension Sister   . Mental illness Sister   . Depression Sister   . Hypertension Sister   . Depression Sister   . COPD Neg Hx   . Breast cancer Neg Hx     Social History   Socioeconomic History  . Marital status: Divorced    Spouse name: Not on file  . Number of children: 2  . Years of education: 72  . Highest education level: Not on file  Occupational History  . Not on file  Social Needs  . Financial resource strain: Somewhat hard  . Food insecurity    Worry: Never true     Inability: Never true  . Transportation needs    Medical: No    Non-medical: No  Tobacco Use  . Smoking status: Former Smoker    Packs/day: 0.50    Years: 10.00    Pack years: 5.00    Types: Cigarettes    Quit date: 04/23/1985    Years since quitting: 34.0  . Smokeless tobacco:  Never Used  Substance and Sexual Activity  . Alcohol use: Yes    Comment: occasional  . Drug use: No  . Sexual activity: Not Currently    Partners: Male  Lifestyle  . Physical activity    Days per week: 3 days    Minutes per session: 20 min  . Stress: Not at all  Relationships  . Social Herbalist on phone: Three times a week    Gets together: Once a week    Attends religious service: More than 4 times per year    Active member of club or organization: No    Attends meetings of clubs or organizations: Never    Relationship status: Divorced  . Intimate partner violence    Fear of current or ex partner: No    Emotionally abused: No    Physically abused: No    Forced sexual activity: No  Other Topics Concern  . Not on file  Social History Narrative  . Not on file     Current Outpatient Medications:  .  Ascorbic Acid (VITAMIN C) 500 MG CAPS, , Disp: , Rfl:  .  Calcium Citrate-Vitamin D 200-250 MG-UNIT TABS, Take by mouth daily., Disp: , Rfl:  .  lisinopril (ZESTRIL) 10 MG tablet, TAKE 1 TABLET(10 MG) BY MOUTH DAILY, Disp: 90 tablet, Rfl: 0 .  Multiple Vitamin (MULTIVITAMIN) capsule, Take 1 capsule by mouth daily., Disp: , Rfl:   No Known Allergies   ROS  Constitutional: Negative for fever or weight change.  Respiratory: Negative for cough and shortness of breath.   Cardiovascular: Negative for chest pain or palpitations.  Gastrointestinal: Negative for abdominal pain, no bowel changes.  Musculoskeletal: Negative for gait problem or joint swelling.  Skin: Negative for rash.  Neurological: Negative for dizziness or headache.  No other specific complaints in a complete  review of systems (except as listed in HPI above).  Objective  Vitals:   05/11/19 0806  BP: 124/68  Pulse: 83  Resp: 14  Temp: 97.6 F (36.4 C)  TempSrc: Temporal  SpO2: 95%  Weight: 151 lb (68.5 kg)  Height: '5\' 3"'  (1.6 m)    Body mass index is 26.75 kg/m.  Physical Exam  Constitutional: Patient appears well-developed and well-nourished. No distress.  HENT: Head: Normocephalic and atraumatic. Ears: B TMs ok, no erythema or effusion; Nose: Nose normal. Mouth/Throat: Oropharynx is clear and moist. No oropharyngeal exudate.  Eyes: Conjunctivae and EOM are normal. Pupils are equal, round, and reactive to light. No scleral icterus.  Neck: Normal range of motion. Neck supple. No JVD present. No thyromegaly present.  Cardiovascular: Normal rate, regular rhythm and normal heart sounds.  No murmur heard. No BLE edema. Pulmonary/Chest: Effort normal and breath sounds normal. No respiratory distress. Abdominal: Soft. Bowel sounds are normal, no distension. There is no tenderness. no masses Breast: no lumps or masses, no nipple discharge or rashes FEMALE GENITALIA: Deferred Musculoskeletal: Normal range of motion, no joint effusions. No gross deformities Neurological: he is alert and oriented to person, place, and time. No cranial nerve deficit. Coordination, balance, strength, speech and gait are normal.  Skin: Skin is warm and dry. No rash noted. No erythema.  Psychiatric: Patient has a normal mood and affect. behavior is normal. Judgment and thought content normal.  No results found for this or any previous visit (from the past 2160 hour(s)).   Fall Risk: Fall Risk  05/11/2019 03/29/2019 11/15/2018 05/09/2018 03/30/2018  Falls in the past year? 0  0 1 0 No  Number falls in past yr: 0 0 1 0 -  Injury with Fall? 0 0 0 - -  Follow up Falls evaluation completed - - - -   Assessment & Plan  1. Well woman exam (no gynecological exam) -USPSTF grade A and B recommendations reviewed with  patient; age-appropriate recommendations, preventive care, screening tests, etc discussed and encouraged; healthy living encouraged; see AVS for patient education given to patient -Discussed importance of 150 minutes of physical activity weekly, eat two servings of fish weekly, eat one serving of tree nuts ( cashews, pistachios, pecans, almonds.Marland Kitchen) every other day, eat 6 servings of fruit/vegetables daily and drink plenty of water and avoid sweet beverages. - CT Chest Wo Contrast; Future  2. Abnormal findings on diagnostic imaging of lung - CT Chest Wo Contrast; Future  3. Need for hepatitis C screening test - Hepatitis C antibody  4. Elevated LDL cholesterol level - Lipid panel  5. Diabetes mellitus screening - COMPLETE METABOLIC PANEL WITH GFR  6. Essential hypertension, benign - COMPLETE METABOLIC PANEL WITH GFR  7. Osteopenia of spine - DG Bone Density; Future - VITAMIN D 25 Hydroxy (Vit-D Deficiency, Fractures)  8. Encounter for screening mammogram for malignant neoplasm of breast - MM 3D SCREEN BREAST BILATERAL; Future

## 2019-05-14 ENCOUNTER — Other Ambulatory Visit: Payer: Self-pay | Admitting: Family Medicine

## 2019-05-14 DIAGNOSIS — E7800 Pure hypercholesterolemia, unspecified: Secondary | ICD-10-CM

## 2019-05-14 LAB — COMPLETE METABOLIC PANEL WITH GFR
AG Ratio: 1.5 (calc) (ref 1.0–2.5)
ALT: 19 U/L (ref 6–29)
AST: 19 U/L (ref 10–35)
Albumin: 4.5 g/dL (ref 3.6–5.1)
Alkaline phosphatase (APISO): 67 U/L (ref 37–153)
BUN: 20 mg/dL (ref 7–25)
CO2: 28 mmol/L (ref 20–32)
Calcium: 9.7 mg/dL (ref 8.6–10.4)
Chloride: 103 mmol/L (ref 98–110)
Creat: 0.88 mg/dL (ref 0.50–0.99)
GFR, Est African American: 80 mL/min/{1.73_m2} (ref >=60)
GFR, Est Non African American: 69 mL/min/{1.73_m2} (ref >=60)
Globulin: 3 g/dL (calc) (ref 1.9–3.7)
Glucose, Bld: 95 mg/dL (ref 65–99)
Potassium: 4.4 mmol/L (ref 3.5–5.3)
Sodium: 139 mmol/L (ref 135–146)
Total Bilirubin: 0.7 mg/dL (ref 0.2–1.2)
Total Protein: 7.5 g/dL (ref 6.1–8.1)

## 2019-05-14 LAB — LIPID PANEL
Cholesterol: 190 mg/dL (ref <200)
HDL: 49 mg/dL — ABNORMAL LOW (ref >=50)
LDL Cholesterol (Calc): 121 mg/dL (calc) — ABNORMAL HIGH
Non-HDL Cholesterol (Calc): 141 mg/dL (calc) — ABNORMAL HIGH (ref <130)
Total CHOL/HDL Ratio: 3.9 (calc) (ref <5.0)
Triglycerides: 96 mg/dL (ref <150)

## 2019-05-14 LAB — HEPATITIS C ANTIBODY
Hepatitis C Ab: NONREACTIVE
SIGNAL TO CUT-OFF: 0.01 (ref <1.00)

## 2019-05-14 LAB — VITAMIN D 25 HYDROXY (VIT D DEFICIENCY, FRACTURES): Vit D, 25-Hydroxy: 44 ng/mL (ref 30–100)

## 2019-06-05 ENCOUNTER — Other Ambulatory Visit: Payer: Self-pay

## 2019-06-05 ENCOUNTER — Ambulatory Visit (INDEPENDENT_AMBULATORY_CARE_PROVIDER_SITE_OTHER): Payer: 59

## 2019-06-05 DIAGNOSIS — Z23 Encounter for immunization: Secondary | ICD-10-CM

## 2019-06-18 ENCOUNTER — Encounter: Payer: Self-pay | Admitting: Family Medicine

## 2019-06-19 ENCOUNTER — Encounter: Payer: Self-pay | Admitting: Family Medicine

## 2019-06-25 ENCOUNTER — Ambulatory Visit
Admission: RE | Admit: 2019-06-25 | Discharge: 2019-06-25 | Disposition: A | Discharge status: Home / Self Care | Payer: 59 | Source: Ambulatory Visit | Attending: Family Medicine | Admitting: Family Medicine

## 2019-06-25 ENCOUNTER — Other Ambulatory Visit: Payer: Self-pay

## 2019-06-25 DIAGNOSIS — R918 Other nonspecific abnormal finding of lung field: Secondary | ICD-10-CM | POA: Diagnosis present

## 2019-07-06 ENCOUNTER — Ambulatory Visit: Payer: 59 | Admitting: Cardiovascular Disease

## 2019-07-10 ENCOUNTER — Ambulatory Visit
Admission: RE | Admit: 2019-07-10 | Discharge: 2019-07-10 | Disposition: A | Discharge status: Home / Self Care | Payer: 59 | Source: Ambulatory Visit | Attending: Family Medicine | Admitting: Family Medicine

## 2019-07-10 DIAGNOSIS — Z1231 Encounter for screening mammogram for malignant neoplasm of breast: Secondary | ICD-10-CM | POA: Insufficient documentation

## 2019-07-10 DIAGNOSIS — Z13820 Encounter for screening for osteoporosis: Secondary | ICD-10-CM | POA: Diagnosis not present

## 2019-07-10 DIAGNOSIS — M8588 Other specified disorders of bone density and structure, other site: Secondary | ICD-10-CM

## 2019-07-10 DIAGNOSIS — Z780 Asymptomatic menopausal state: Secondary | ICD-10-CM | POA: Insufficient documentation

## 2019-07-12 ENCOUNTER — Ambulatory Visit
Admission: RE | Admit: 2019-07-12 | Discharge: 2019-07-12 | Disposition: A | Discharge status: Home / Self Care | Payer: 59 | Source: Ambulatory Visit | Attending: Family Medicine | Admitting: Family Medicine

## 2019-07-12 ENCOUNTER — Encounter: Payer: Self-pay | Admitting: Family Medicine

## 2019-07-12 ENCOUNTER — Ambulatory Visit: Payer: 59 | Admitting: Family Medicine

## 2019-07-12 ENCOUNTER — Ambulatory Visit: Payer: 59 | Admitting: Cardiovascular Disease

## 2019-07-12 ENCOUNTER — Encounter: Payer: Self-pay | Admitting: Cardiovascular Disease

## 2019-07-12 ENCOUNTER — Ambulatory Visit
Admission: RE | Admit: 2019-07-12 | Discharge: 2019-07-12 | Disposition: A | Discharge status: Home / Self Care | Payer: 59 | Attending: Family Medicine | Admitting: Family Medicine

## 2019-07-12 ENCOUNTER — Other Ambulatory Visit: Payer: Self-pay

## 2019-07-12 VITALS — BP 120/80 | HR 64 | Temp 97.5°F | Resp 16 | Ht 63.0 in | Wt 151.8 lb

## 2019-07-12 VITALS — BP 126/83 | HR 69 | Ht 63.0 in | Wt 153.0 lb

## 2019-07-12 DIAGNOSIS — E785 Hyperlipidemia, unspecified: Secondary | ICD-10-CM | POA: Diagnosis not present

## 2019-07-12 DIAGNOSIS — M816 Localized osteoporosis [Lequesne]: Secondary | ICD-10-CM | POA: Diagnosis not present

## 2019-07-12 DIAGNOSIS — M545 Low back pain, unspecified: Secondary | ICD-10-CM

## 2019-07-12 DIAGNOSIS — Z87442 Personal history of urinary calculi: Secondary | ICD-10-CM

## 2019-07-12 DIAGNOSIS — I10 Essential (primary) hypertension: Secondary | ICD-10-CM | POA: Diagnosis not present

## 2019-07-12 DIAGNOSIS — N3001 Acute cystitis with hematuria: Secondary | ICD-10-CM | POA: Diagnosis not present

## 2019-07-12 LAB — POCT URINALYSIS DIPSTICK
Bilirubin, UA: NEGATIVE
Glucose, UA: NEGATIVE
Ketones, UA: NEGATIVE
Nitrite, UA: NEGATIVE
Protein, UA: POSITIVE — AB
Spec Grav, UA: 1.015 (ref 1.010–1.025)
Urobilinogen, UA: 0.2 E.U./dL
pH, UA: 5 (ref 5.0–8.0)

## 2019-07-12 MED ORDER — LISINOPRIL 10 MG PO TABS: ORAL_TABLET | ORAL | 3 refills | Status: DC

## 2019-07-12 MED ORDER — SULFAMETHOXAZOLE-TRIMETHOPRIM 800-160 MG PO TABS: 1.0000 | ORAL_TABLET | Freq: Two times a day (BID) | ORAL | 0 refills | Status: AC

## 2019-07-12 NOTE — Progress Notes (Signed)
Name: Alexandria Hill   MRN: [id number]    DOB: March 03, 1956   Date:07/12/2019       Progress Note  Subjective  Chief Complaint  Chief Complaint  Patient presents with  . Follow-up    discuss Bone Density results  . Urinary Tract Infection    HPI  RIGHT Low back pain: She notes that she has had right lower back discomfort for about 6 days.  She notes history of kidney stones in the past.  She denies urinary frequency, changes to urine volume, abdominal pain, fevers/chills, N/V, frank hematuria, no recent injury or overuse.  She has history sciatica on the right low back, though no radiation into the RLE today; no numbness/tingling/weakness.  She does see Dr. Bernardo Heater annually and has been doing well.    Osteoporosis: Was found on Bone Density 07/10/2019 (was previously osteopenic) with T-score - 2.6 in L3-L4. Her recent Vitamin D and Calcium levels were normal in December 2020.  She is already taking Vitamin D and Caclium supplements.  She is open to starting Fosamax - she thinks that she took for about a year several years ago, but can't remember.  She denies any history of dental infections, she denies any GERD or reflux.  She is walking 150 minutes a week, eating a healthy diet.  She notes family history of osteoporosis in mother.   Patient Active Problem List   Diagnosis Date Noted  . Recurrent UTI 01/30/2019  . Preventative health care 05/09/2018  . Lung nodule, solitary 05/09/2018  . IBS (irritable bowel syndrome) 11/14/2017  . Sciatic nerve pain, right 11/14/2017  . Personal history of urinary calculi 11/14/2017  . Essential hypertension, benign 08/11/2016  . Overweight (BMI 25.0-29.9) 08/11/2016  . Heartburn 08/11/2016  . Varicella exposure 04/24/2015  . Osteopenia     Social History   Tobacco Use  . Smoking status: Former Smoker    Packs/day: 0.50    Years: 10.00    Pack years: 5.00    Types: Cigarettes    Quit date: 04/23/1985    Years since quitting: 34.2  .  Smokeless tobacco: Never Used  Substance Use Topics  . Alcohol use: Yes    Comment: occasional     Current Outpatient Medications:  .  Ascorbic Acid (VITAMIN C) 500 MG CAPS, , Disp: , Rfl:  .  Calcium Citrate-Vitamin D 200-250 MG-UNIT TABS, Take by mouth daily., Disp: , Rfl:  .  lisinopril (ZESTRIL) 10 MG tablet, TAKE 1 TABLET(10 MG) BY MOUTH DAILY, Disp: 90 tablet, Rfl: 0 .  Misc Natural Products (OSTEO BI-FLEX JOINT SHIELD) TABS, , Disp: , Rfl:  .  Multiple Vitamin (MULTIVITAMIN) capsule, Take 1 capsule by mouth daily., Disp: , Rfl:   Allergies  Allergen Reactions  . No Known Allergies     I personally reviewed active problem list, medication list, allergies, notes from last encounter, lab results with the patient/caregiver today.  ROS  Ten systems reviewed and is negative except as mentioned in HPI  Objective  Vitals:   07/12/19 0833  BP: 120/80  Pulse: 64  Resp: 16  Temp: (!) 97.5 F (36.4 C)  TempSrc: Temporal  SpO2: 93%  Weight: 151 lb 12.8 oz (68.9 kg)  Height: 5\' 3"  (1.6 m)    Body mass index is 26.89 kg/m.  Nursing Note and Vital Signs reviewed.  Physical Exam  Constitutional: Patient appears well-developed and well-nourished. Obese. No distress.  HEENT: head atraumatic, normocephalic.  Cardiovascular: Normal rate, regular rhythm and  normal heart sounds.  No murmur heard. No BLE edema. Pulmonary/Chest: Effort normal and breath sounds clear bilaterally. No respiratory distress. Abdominal: Soft, bowel sounds normal, there is no tenderness, no HSM, no CVA tenderness. Psychiatric: Patient has a normal mood and affect. behavior is normal. Judgment and thought content normal.   Results for orders placed or performed in visit on 07/12/19 (from the past 72 hour(s))  POCT urinalysis dipstick     Status: Abnormal   Collection Time: 07/12/19  8:43 AM  Result Value Ref Range   Color, UA yellow    Clarity, UA clear    Glucose, UA Negative Negative    Bilirubin, UA negative    Ketones, UA negative    Spec Grav, UA 1.015 1.010 - 1.025   Blood, UA trace    pH, UA 5.0 5.0 - 8.0   Protein, UA Positive (A) Negative   Urobilinogen, UA 0.2 0.2 or 1.0 E.U./dL   Nitrite, UA negative    Leukocytes, UA Trace (A) Negative   Appearance clear    Odor none    Assessment & Plan  1. Acute low back pain without sciatica, unspecified back pain laterality - POCT urinalysis dipstick - DG Abd 1 View; Future - sulfamethoxazole-trimethoprim (BACTRIM DS) 800-160 MG tablet; Take 1 tablet by mouth 2 (two) times daily for 3 days.  Dispense: 6 tablet; Refill: 0  2. Acute cystitis with hematuria - Urine Culture - sulfamethoxazole-trimethoprim (BACTRIM DS) 800-160 MG tablet; Take 1 tablet by mouth 2 (two) times daily for 3 days.  Dispense: 6 tablet; Refill: 0  3. History of kidney stones - KUB to evaluate for possible stone and there is trace hematuria and back pain.  - DG Abd 1 View; Future - sulfamethoxazole-trimethoprim (BACTRIM DS) 800-160 MG tablet; Take 1 tablet by mouth 2 (two) times daily for 3 days.  Dispense: 6 tablet; Refill: 0  4. Localized osteoporosis without current pathological fracture - TSH - PTH, intact and calcium - Discussed at length with patient - she is open to starting fosamax after PTH and TSH are returned and if they are normal.  She will continue Calcium and Vitamin D supplements.  She is an avid walker, getting 154minutes a week and will continue this as well.  Discussed risk of jaw necrosis and esophageal issues with bisphosphonate therapy, she is aware of risks and signs and symptoms to notify our clinic if these occur. - Plan to recheck bone density in 2 years.   -Red flags and when to present for emergency care or RTC including fever >101.71F, chest pain, shortness of breath, new/worsening/un-resolving symptoms, severe abdomina/flank pain, N/V reviewed with patient at time of visit. Follow up and care instructions discussed and  provided in AVS.

## 2019-07-12 NOTE — Patient Instructions (Signed)
Medication Instructions:  Your physician recommends that you continue on your current medications as directed. Please refer to the Current Medication list given to you today.  Lisinopril has been refilled today.  *If you need a refill on your cardiac medications before your next appointment, please call your pharmacy*  Lab Work: None ordered If you have labs (blood work) drawn today and your tests are completely normal, you will receive your results only by: Marland Kitchen MyChart Message (if you have MyChart) OR . A paper copy in the mail If you have any lab test that is abnormal or we need to change your treatment, we will call you to review the results.  Testing/Procedures: None ordered  Follow-Up: At [facility], you and your health needs are our priority.  As part of our continuing mission to provide you with exceptional heart care, we have created designated Provider Care Teams.  These Care Teams include your primary Cardiologist (physician) and Advanced Practice Providers (APPs -  Physician Assistants and Nurse Practitioners) who all work together to provide you with the care you need, when you need it.  Your next appointment:   As needed   The format for your next appointment:   In Person  Provider:    You may see  Dr. Fletcher Anon or one of the following Advanced Practice Providers on your designated Care Team:    Murray Hodgkins, NP  Christell Faith, PA-C  Marrianne Mood, PA-C   Other Instructions N/A

## 2019-07-12 NOTE — Progress Notes (Signed)
Cardiology Office Note   Date:  07/12/2019   ID:  Alexandria Hill, Alexandria Hill Jan 20, 1956, MRN [id number]  PCP:  Alexandria Hartshorn, FNP  Cardiologist:   Kathlyn Sacramento, MD   Chief Complaint  Patient presents with  . office visit    12 month F/U; Meds verbally reviewed with patient.      History of Present Illness: Alexandria Hill is a 64 y.o. female who presents for a follow-up visit regarding hypertension.  She was seen in the past by Dr. Yvone Neu.  She quit smoking 1986.  She has other medical problems including GERD and family history of coronary artery disease.  Previous [employer] in March 2018 was normal.  Echocardiogram around the same time also was unremarkable. She had atypical chest pain in 2019. She underwent CTA of the coronary arteries which showed normal coronary arteries and a calcium score of 0.  Her blood pressure improved with lisinopril. She has been doing very well with no recent chest pain, shortness of breath or palpitations.  Past Medical History:  Diagnosis Date  . GERD (gastroesophageal reflux disease)   . Hypertension   . IBS (irritable bowel syndrome) 2019  . Osteopenia   . Overweight (BMI 25.0-29.9) 08/11/2016  . Reflux   . Renal calculi 07/2017    Past Surgical History:  Procedure Laterality Date  . CESAREAN SECTION  1987  . COLONOSCOPY WITH PROPOFOL N/A 12/05/2014   Procedure: COLONOSCOPY WITH PROPOFOL;  Surgeon: Lucilla Lame, MD;  Location: [facility] ENDOSCOPY;  Service: Endoscopy;  Laterality: N/A;  . EXTRACORPOREAL SHOCK WAVE LITHOTRIPSY Left 07/14/2017   Procedure: EXTRACORPOREAL SHOCK WAVE LITHOTRIPSY (ESWL);  Surgeon: Abbie Sons, MD;  Location: [facility] ORS;  Service: Urology;  Laterality: Left;     Current Outpatient Medications  Medication Sig Dispense Refill  . Ascorbic Acid (VITAMIN C) 500 MG CAPS Take by mouth daily.     . Calcium Citrate-Vitamin D 200-250 MG-UNIT TABS Take by mouth daily.    Marland Kitchen lisinopril (ZESTRIL) 10 MG tablet TAKE 1  TABLET(10 MG) BY MOUTH DAILY 90 tablet 0  . Misc Natural Products (OSTEO BI-FLEX JOINT SHIELD) TABS Take by mouth daily.     . Multiple Vitamin (MULTIVITAMIN) capsule Take 1 capsule by mouth daily.    Marland Kitchen sulfamethoxazole-trimethoprim (BACTRIM DS) 800-160 MG tablet Take 1 tablet by mouth 2 (two) times daily for 3 days. 6 tablet 0   No current facility-administered medications for this visit.    Allergies:   No known allergies    Social History:  The patient  reports that she quit smoking about 34 years ago. Her smoking use included cigarettes. She has a 5.00 pack-year smoking history. She has never used smokeless tobacco. She reports current alcohol use. She reports that she does not use drugs.   Family History:  The patient's family history includes Aneurysm in her mother; Cancer in her brother and maternal grandmother; Depression in her sister and sister; Diabetes in her maternal aunt; Heart attack in her father; Heart disease in her father; Hypertension in her mother, sister, sister, and sister; Mental illness in her sister and sister; Mental retardation in her father; Obesity in her sister; Stroke in her mother.    ROS:  Please see the history of present illness.   Otherwise, review of systems are positive for none.   All other systems are reviewed and negative.    PHYSICAL EXAM: VS:  BP 126/83 (BP Location: Left Arm, Patient Position: Sitting, Cuff Size: Normal)  Pulse 69   Ht 5\' 3"  (1.6 m)   Wt 153 lb (69.4 kg)   SpO2 99%   BMI 27.10 kg/m  , BMI Body mass index is 27.1 kg/m. GEN: Well nourished, well developed, in no acute distress  HEENT: normal  Neck: no JVD, carotid bruits, or masses Cardiac: RRR; no murmurs, rubs, or gallops,no edema  Respiratory:  clear to auscultation bilaterally, normal work of breathing GI: soft, nontender, nondistended, + BS MS: no deformity or atrophy  Skin: warm and dry, no rash Neuro:  Strength and sensation are intact Psych: euthymic mood, full  affect   EKG:  EKG is  ordered today. EKG showed sinus rhythm with possible old septal infarct which was present on her previous EKGs. No significant ST or T wave changes.  Recent Labs: 05/11/2019: ALT 19; BUN 20; Creat 0.88; Potassium 4.4; Sodium 139    Lipid Panel    Component Value Date/Time   CHOL 190 05/11/2019 0000   CHOL 169 04/24/2015 0848   TRIG 96 05/11/2019 0000   HDL 49 (L) 05/11/2019 0000   HDL 53 04/24/2015 0848   CHOLHDL 3.9 05/11/2019 0000   VLDL 17 04/26/2016 0903   LDLCALC 121 (H) 05/11/2019 0000      Wt Readings from Last 3 Encounters:  07/12/19 153 lb (69.4 kg)  07/12/19 151 lb 12.8 oz (68.9 kg)  05/11/19 151 lb (68.5 kg)       PAD Screen 08/12/2016  Previous PAD dx? No  Previous surgical procedure? No  Pain with walking? Yes  Subsides with rest? Yes  Feet/toe relief with dangling? No  Painful, non-healing ulcers? No  Extremities discolored? No      ASSESSMENT AND PLAN:  1.  Essential hypertension: Blood pressure seems to be reasonably controlled on lisinopril.  I made no changes.  2.  Hyperlipidemia: Most recent lipid profile showed an LDL of 121.  CTA of the coronary arteries showed no evidence of coronary atherosclerosis with calcium score of 0. Continue with healthy lifestyle changes.    Disposition:   She can follow-up with [place] as needed if there are any other cardiac related issues. I will ask her primary care physician to start refilling lisinopril.  Signed,  Kathlyn Sacramento, MD  07/12/2019 4:08 PM    [facility]

## 2019-07-13 ENCOUNTER — Other Ambulatory Visit: Payer: Self-pay | Admitting: Family Medicine

## 2019-07-13 DIAGNOSIS — M810 Age-related osteoporosis without current pathological fracture: Secondary | ICD-10-CM

## 2019-07-13 LAB — URINE CULTURE
MICRO NUMBER:: 10117745
SPECIMEN QUALITY:: ADEQUATE

## 2019-07-13 LAB — PTH, INTACT AND CALCIUM
Calcium: 9.9 mg/dL (ref 8.6–10.4)
PTH: 24 pg/mL (ref 14–64)

## 2019-07-13 LAB — TSH: TSH: 1.19 mIU/L (ref 0.40–4.50)

## 2019-07-13 MED ORDER — ALENDRONATE SODIUM 70 MG PO TABS: 70.0000 mg | ORAL_TABLET | ORAL | 3 refills | Status: DC

## 2019-07-13 MED ORDER — LISINOPRIL 10 MG PO TABS: ORAL_TABLET | ORAL | 3 refills | Status: DC

## 2019-07-22 ENCOUNTER — Ambulatory Visit: Payer: 59

## 2019-08-01 ENCOUNTER — Encounter: Payer: Self-pay | Admitting: Family Medicine

## 2019-08-07 ENCOUNTER — Encounter: Payer: Self-pay | Admitting: Family Medicine

## 2019-09-03 IMAGING — MG DIGITAL SCREENING BILATERAL MAMMOGRAM WITH TOMO AND CAD
6 of 10 series · 6 of 30 positions shown · non-contrast
Comparison: Previous exam(s).

CLINICAL DATA: Screening.

EXAM:
DIGITAL SCREENING BILATERAL MAMMOGRAM WITH TOMO AND CAD

[L CC synth-2D (1 of 2)]
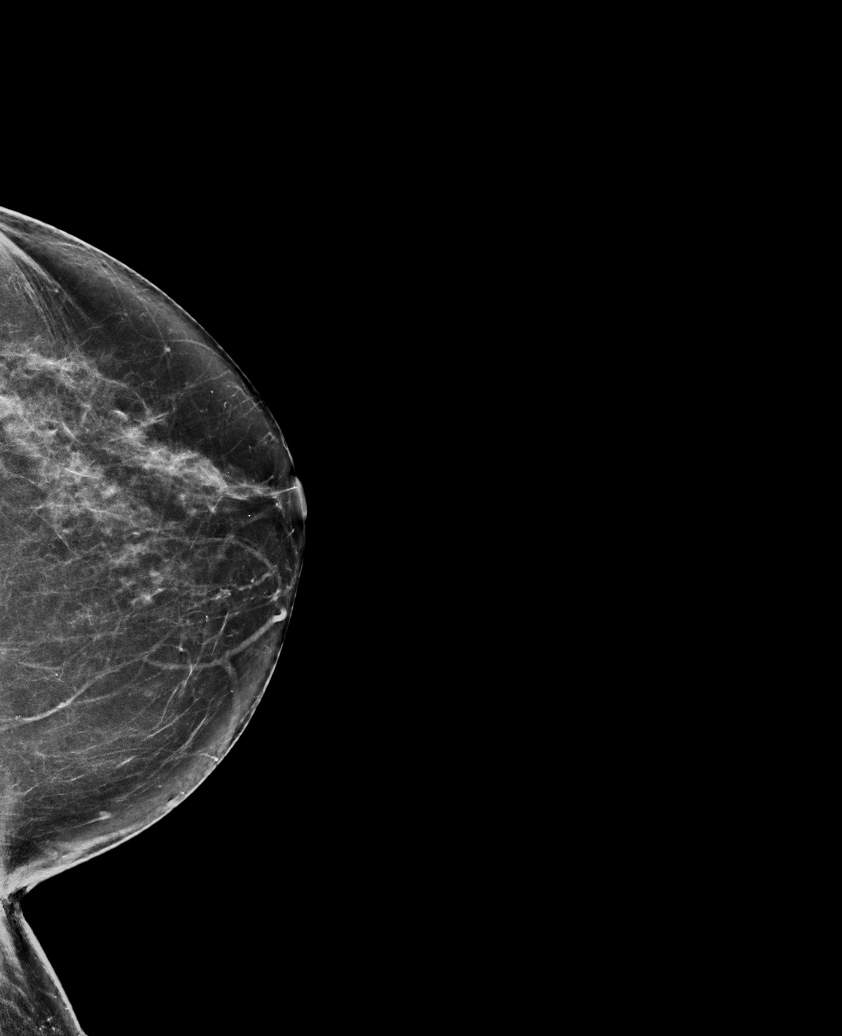

[L CC synth-2D (2 of 2)]
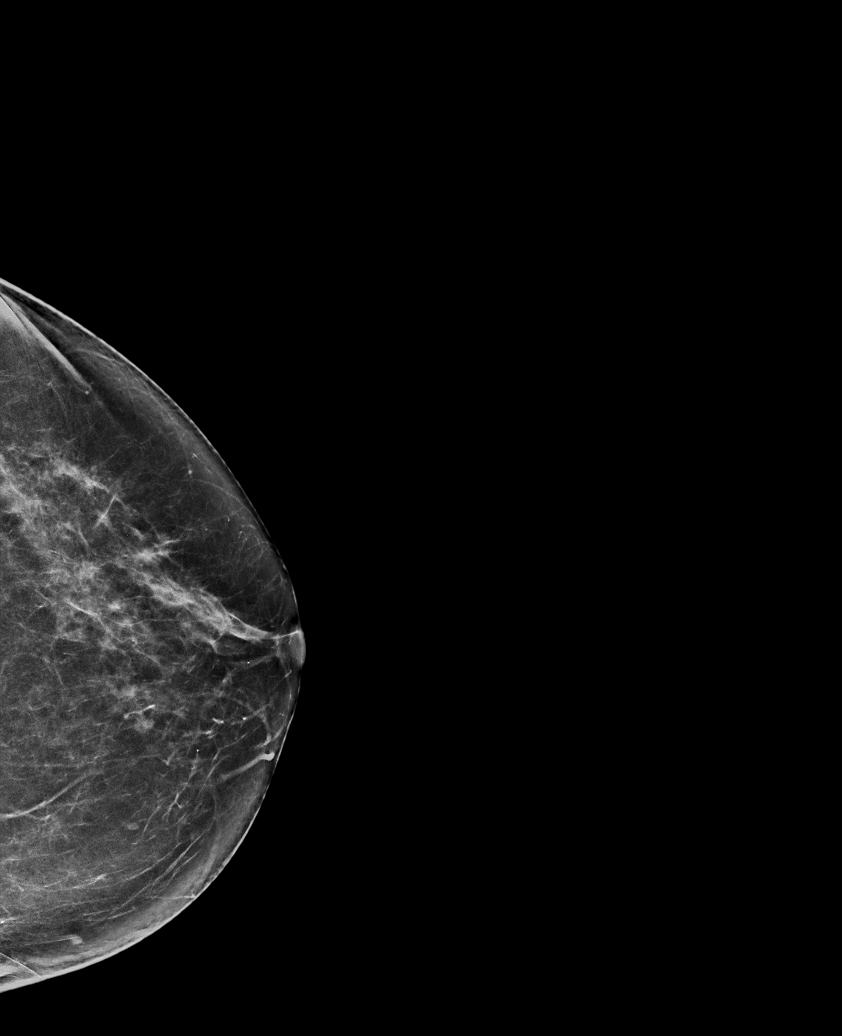

[R CC synth-2D]
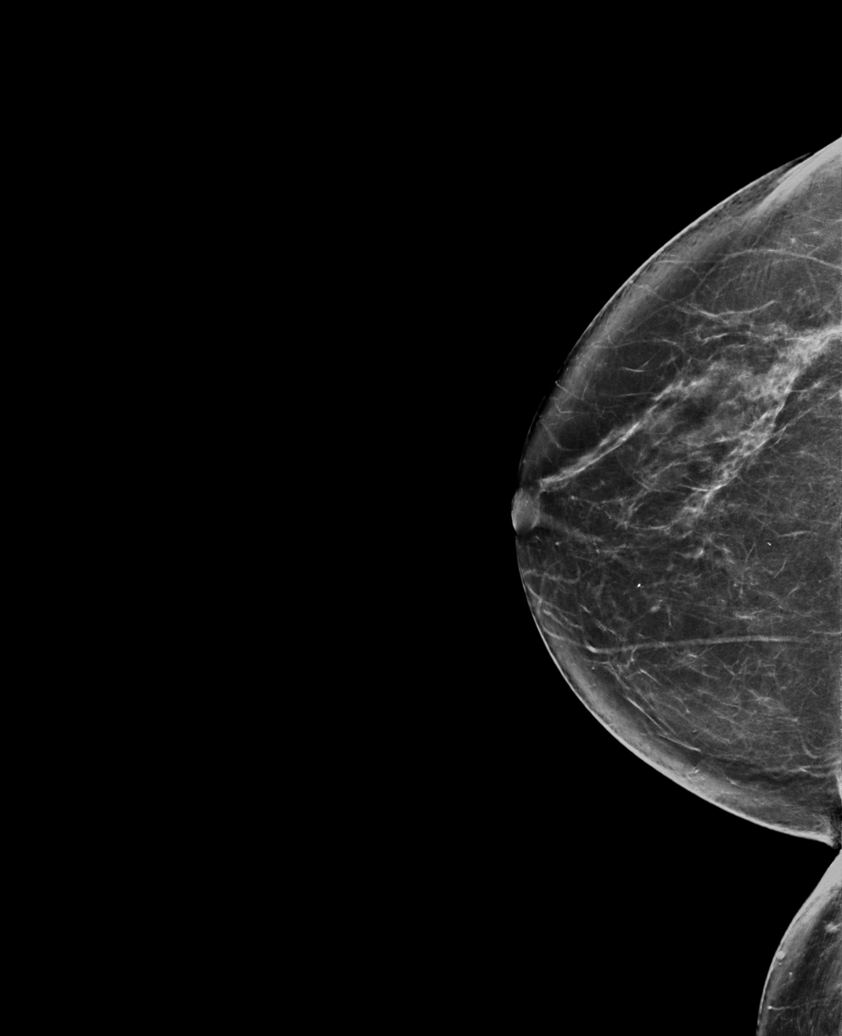

[L MLO synth-2D]
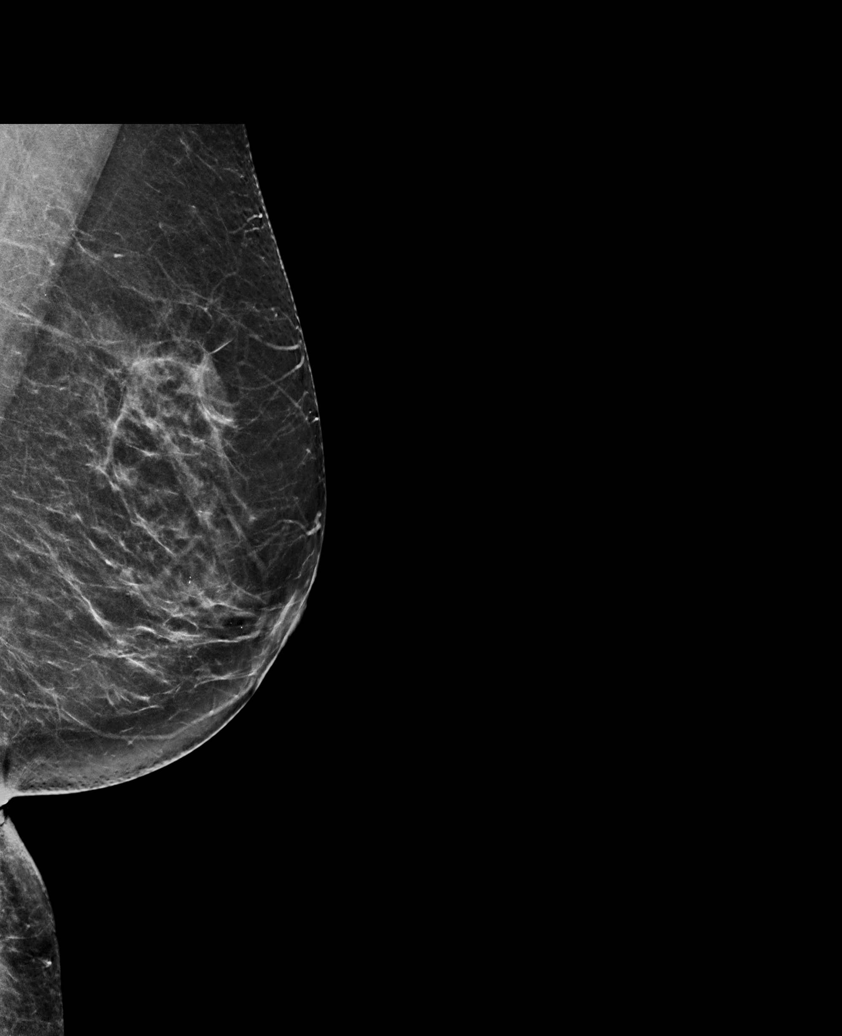

[R MLO synth-2D]
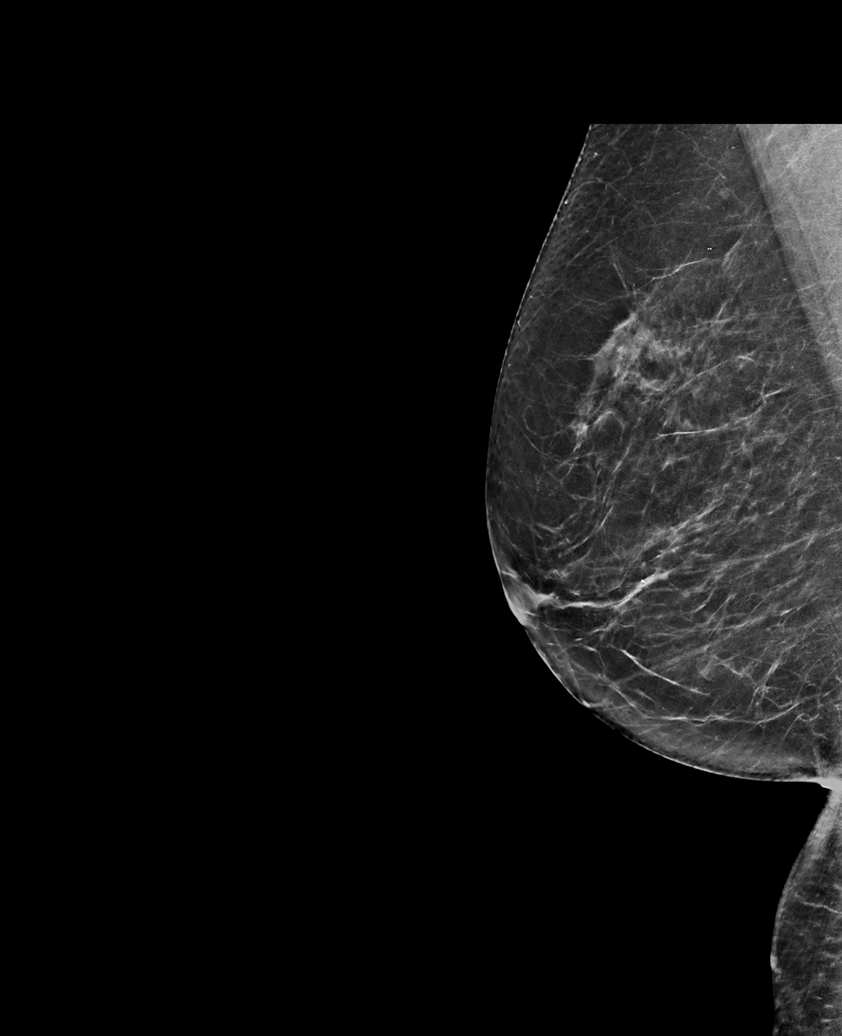

[L MLO tomo · tomo slice 37/72.0]
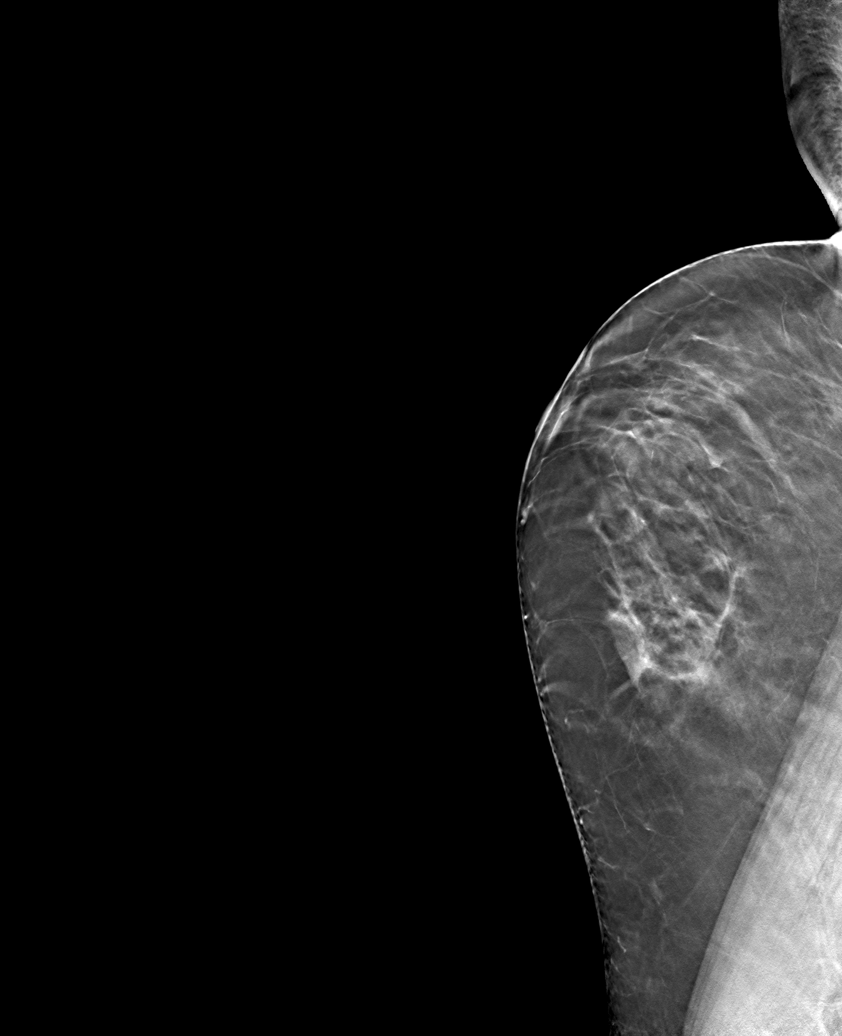

[6 of 30 positions shown; findings below may reference images not displayed]

ACR Breast Density Category b: There are scattered areas of
fibroglandular density.
FINDINGS: There are no findings suspicious for malignancy. Images were
processed with CAD.
IMPRESSION: No mammographic evidence of malignancy. A result letter of this
screening mammogram will be mailed directly to the patient.

RECOMMENDATION:
Screening mammogram in one year. (Code:[id number])

BI-RADS CATEGORY  1: Negative.

## 2019-10-01 ENCOUNTER — Encounter: Payer: Self-pay | Admitting: Family Medicine

## 2019-10-03 ENCOUNTER — Other Ambulatory Visit: Payer: Self-pay

## 2019-10-03 ENCOUNTER — Ambulatory Visit (INDEPENDENT_AMBULATORY_CARE_PROVIDER_SITE_OTHER): Payer: 59

## 2019-10-03 DIAGNOSIS — Z23 Encounter for immunization: Secondary | ICD-10-CM

## 2020-02-01 ENCOUNTER — Ambulatory Visit: Payer: 59 | Admitting: Urology

## 2020-02-14 ENCOUNTER — Telehealth (INDEPENDENT_AMBULATORY_CARE_PROVIDER_SITE_OTHER): Payer: Self-pay | Admitting: Gastroenterology

## 2020-02-14 ENCOUNTER — Other Ambulatory Visit: Payer: Self-pay

## 2020-02-14 DIAGNOSIS — Z86010 Personal history of colonic polyps: Secondary | ICD-10-CM

## 2020-02-14 MED ORDER — NA SULFATE-K SULFATE-MG SULF 17.5-3.13-1.6 GM/177ML PO SOLN: 354.0000 mL | Freq: Once | ORAL | 0 refills | Status: AC

## 2020-02-14 NOTE — Progress Notes (Signed)
Gastroenterology Pre-Procedure Review  Request Date: 03/17/2020 Requesting Physician: Dr. Allen Norris  PATIENT REVIEW QUESTIONS: The patient responded to the following health history questions as indicated:    1. Are you having any GI issues? no 2. Do you have a personal history of Polyps? Yes was 5 years ago   3. Do you have a family history of Colon Cancer or Polyps? Yes Paternal Aunt had colon cancer.  4. Diabetes Mellitus? no 5. Joint replacements in the past 12 months?no 6. Major health problems in the past 3 months?no 7. Any artificial heart valves, MVP, or defibrillator?no    MEDICATIONS & ALLERGIES:    Patient reports the following regarding taking any anticoagulation/antiplatelet therapy:   Plavix, Coumadin, Eliquis, Xarelto, Lovenox, Pradaxa, Brilinta, or Effient? no Aspirin? no  Patient confirms/reports the following medications:  Current Outpatient Medications  Medication Sig Dispense Refill  . alendronate (FOSAMAX) 70 MG tablet Take 1 tablet (70 mg total) by mouth every 7 (seven) days. Take with a full glass of water on an empty stomach. 12 tablet 3  . Ascorbic Acid (VITAMIN C) 500 MG CAPS Take by mouth daily.     . Calcium Citrate-Vitamin D 200-250 MG-UNIT TABS Take by mouth daily.    Marland Kitchen lisinopril (ZESTRIL) 10 MG tablet TAKE 1 TABLET(10 MG) BY MOUTH DAILY 90 tablet 3  . Misc Natural Products (OSTEO BI-FLEX JOINT SHIELD) TABS Take by mouth daily.     . Multiple Vitamin (MULTIVITAMIN) capsule Take 1 capsule by mouth daily.    . Na Sulfate-K Sulfate-Mg Sulf 17.5-3.13-1.6 GM/177ML SOLN Take 354 mLs by mouth once for 1 dose. 354 mL 0   No current facility-administered medications for this visit.    Patient confirms/reports the following allergies:  Allergies  Allergen Reactions  . No Known Allergies     No orders of the defined types were placed in this encounter.   AUTHORIZATION INFORMATION Primary Insurance: 1D#: Group #:  Secondary Insurance: 1D#: Group  #:  SCHEDULE INFORMATION: Date:  Time: Location:

## 2020-03-04 ENCOUNTER — Ambulatory Visit (INDEPENDENT_AMBULATORY_CARE_PROVIDER_SITE_OTHER): Payer: Medicare Other

## 2020-03-04 ENCOUNTER — Other Ambulatory Visit: Payer: Self-pay

## 2020-03-04 DIAGNOSIS — Z23 Encounter for immunization: Secondary | ICD-10-CM

## 2020-03-05 ENCOUNTER — Other Ambulatory Visit: Payer: Medicare Other

## 2020-03-05 ENCOUNTER — Encounter: Payer: Self-pay | Admitting: Gastroenterology

## 2020-03-05 DIAGNOSIS — Z20822 Contact with and (suspected) exposure to covid-19: Secondary | ICD-10-CM

## 2020-03-06 LAB — SARS-COV-2, NAA 2 DAY TAT

## 2020-03-06 LAB — NOVEL CORONAVIRUS, NAA: SARS-CoV-2, NAA: NOT DETECTED

## 2020-03-13 ENCOUNTER — Other Ambulatory Visit: Payer: Self-pay

## 2020-03-13 ENCOUNTER — Other Ambulatory Visit
Admission: RE | Admit: 2020-03-13 | Discharge: 2020-03-13 | Disposition: A | Discharge status: Home / Self Care | Payer: Medicare Other | Source: Ambulatory Visit | Attending: Gastroenterology | Admitting: Gastroenterology

## 2020-03-13 DIAGNOSIS — Z20822 Contact with and (suspected) exposure to covid-19: Secondary | ICD-10-CM | POA: Diagnosis not present

## 2020-03-13 DIAGNOSIS — Z01818 Encounter for other preprocedural examination: Secondary | ICD-10-CM | POA: Insufficient documentation

## 2020-03-13 LAB — SARS CORONAVIRUS 2 (TAT 6-24 HRS): SARS Coronavirus 2: NEGATIVE

## 2020-03-14 NOTE — Discharge Instructions (Signed)
General Anesthesia, Adult, Care After This sheet gives you information about how to care for yourself after your procedure. Your health care provider may also give you more specific instructions. If you have problems or questions, contact your health care provider. What can I expect after the procedure? After the procedure, the following side effects are common:  Pain or discomfort at the IV site.  Nausea.  Vomiting.  Sore throat.  Trouble concentrating.  Feeling cold or chills.  Weak or tired.  Sleepiness and fatigue.  Soreness and body aches. These side effects can affect parts of the body that were not involved in surgery. Follow these instructions at home:  For at least 24 hours after the procedure:  Have a responsible adult stay with you. It is important to have someone help care for you until you are awake and alert.  Rest as needed.  Do not: ? Participate in activities in which you could fall or become injured. ? Drive. ? Use heavy machinery. ? Drink alcohol. ? Take sleeping pills or medicines that cause drowsiness. ? Make important decisions or sign legal documents. ? Take care of children on your own. Eating and drinking  Follow any instructions from your health care provider about eating or drinking restrictions.  When you feel hungry, start by eating small amounts of foods that are soft and easy to digest (bland), such as toast. Gradually return to your regular diet.  Drink enough fluid to keep your urine pale yellow.  If you vomit, rehydrate by drinking water, juice, or clear broth. General instructions  If you have sleep apnea, surgery and certain medicines can increase your risk for breathing problems. Follow instructions from your health care provider about wearing your sleep device: ? Anytime you are sleeping, including during daytime naps. ? While taking prescription pain medicines, sleeping medicines, or medicines that make you drowsy.  Return to  your normal activities as told by your health care provider. Ask your health care provider what activities are safe for you.  Take over-the-counter and prescription medicines only as told by your health care provider.  If you smoke, do not smoke without supervision.  Keep all follow-up visits as told by your health care provider. This is important. Contact a health care provider if:  You have nausea or vomiting that does not get better with medicine.  You cannot eat or drink without vomiting.  You have pain that does not get better with medicine.  You are unable to pass urine.  You develop a skin rash.  You have a fever.  You have redness around your IV site that gets worse. Get help right away if:  You have difficulty breathing.  You have chest pain.  You have blood in your urine or stool, or you vomit blood. Summary  After the procedure, it is common to have a sore throat or nausea. It is also common to feel tired.  Have a responsible adult stay with you for the first 24 hours after general anesthesia. It is important to have someone help care for you until you are awake and alert.  When you feel hungry, start by eating small amounts of foods that are soft and easy to digest (bland), such as toast. Gradually return to your regular diet.  Drink enough fluid to keep your urine pale yellow.  Return to your normal activities as told by your health care provider. Ask your health care provider what activities are safe for you. This information is not   intended to replace advice given to you by your health care provider. Make sure you discuss any questions you have with your health care provider. Document Revised: 05/27/2017 Document Reviewed: 01/07/2017 Elsevier Patient Education  2020 Elsevier Inc.  

## 2020-03-17 ENCOUNTER — Ambulatory Visit: Payer: Medicare Other | Admitting: Anesthesiology

## 2020-03-17 ENCOUNTER — Other Ambulatory Visit: Payer: Self-pay

## 2020-03-17 ENCOUNTER — Encounter
Admission: RE | Disposition: A | Discharge status: Home / Self Care | Payer: Self-pay | Source: Home / Self Care | Attending: Gastroenterology

## 2020-03-17 ENCOUNTER — Ambulatory Visit
Admission: RE | Admit: 2020-03-17 | Discharge: 2020-03-17 | Disposition: A | Discharge status: Home / Self Care | Payer: Medicare Other | Attending: Gastroenterology | Admitting: Gastroenterology

## 2020-03-17 DIAGNOSIS — Z1211 Encounter for screening for malignant neoplasm of colon: Secondary | ICD-10-CM | POA: Insufficient documentation

## 2020-03-17 DIAGNOSIS — Z6824 Body mass index (BMI) 24.0-24.9, adult: Secondary | ICD-10-CM | POA: Diagnosis not present

## 2020-03-17 DIAGNOSIS — I10 Essential (primary) hypertension: Secondary | ICD-10-CM | POA: Diagnosis not present

## 2020-03-17 DIAGNOSIS — Z860100 Personal history of colon polyps, unspecified: Secondary | ICD-10-CM

## 2020-03-17 DIAGNOSIS — Z7983 Long term (current) use of bisphosphonates: Secondary | ICD-10-CM | POA: Diagnosis not present

## 2020-03-17 DIAGNOSIS — K640 First degree hemorrhoids: Secondary | ICD-10-CM | POA: Diagnosis not present

## 2020-03-17 DIAGNOSIS — E663 Overweight: Secondary | ICD-10-CM | POA: Diagnosis not present

## 2020-03-17 DIAGNOSIS — Z79899 Other long term (current) drug therapy: Secondary | ICD-10-CM | POA: Insufficient documentation

## 2020-03-17 DIAGNOSIS — Z87891 Personal history of nicotine dependence: Secondary | ICD-10-CM | POA: Diagnosis not present

## 2020-03-17 HISTORY — PX: COLONOSCOPY WITH PROPOFOL: SHX5780

## 2020-03-17 SURGERY — COLONOSCOPY WITH PROPOFOL
Anesthesia: General | Site: Rectum

## 2020-03-17 MED ORDER — LACTATED RINGERS IV SOLN: INTRAVENOUS | Status: DC

## 2020-03-17 MED ORDER — STERILE WATER FOR IRRIGATION IR SOLN: Status: DC | PRN

## 2020-03-17 MED ORDER — SODIUM CHLORIDE 0.9 % IV SOLN: INTRAVENOUS | Status: DC

## 2020-03-17 MED ORDER — LIDOCAINE HCL (CARDIAC) PF 100 MG/5ML IV SOSY
PREFILLED_SYRINGE | INTRAVENOUS | Status: DC | PRN
  Administered 2020-03-17: 60 mg via INTRAVENOUS

## 2020-03-17 MED ORDER — PROPOFOL 10 MG/ML IV BOLUS
INTRAVENOUS | Status: DC | PRN
  Administered 2020-03-17: 30 mg via INTRAVENOUS
  Administered 2020-03-17: 50 mg via INTRAVENOUS
  Administered 2020-03-17: 20 mg via INTRAVENOUS
  Administered 2020-03-17 (×2): 30 mg via INTRAVENOUS

## 2020-03-17 SURGICAL SUPPLY — 6 items
GOWN CVR UNV OPN BCK APRN NK (MISCELLANEOUS) ×2 IMPLANT
GOWN ISOL THUMB LOOP REG UNIV (MISCELLANEOUS) ×2
KIT PRC NS LF DISP ENDO (KITS) ×1 IMPLANT
KIT PROCEDURE OLYMPUS (KITS) ×1
MANIFOLD NEPTUNE II (INSTRUMENTS) ×2 IMPLANT
WATER STERILE IRR 250ML POUR (IV SOLUTION) ×2 IMPLANT

## 2020-03-17 NOTE — H&P (Signed)
Alexandria Lame, MD [address] [place], [place] [address] [phone] Fax : [phone]  Primary Care Physician:  Steele Sizer, MD Primary Gastroenterologist:  Dr. Allen Norris  Pre-Procedure History & Physical: HPI:  Alexandria Hill is a 64 y.o. female is here for an colonoscopy.   Past Medical History:  Diagnosis Date  . GERD (gastroesophageal reflux disease)   . Hypertension   . IBS (irritable bowel syndrome) 2019  . Osteopenia   . Overweight (BMI 25.0-29.9) 08/11/2016  . Reflux   . Renal calculi 07/2017    Past Surgical History:  Procedure Laterality Date  . CESAREAN SECTION  1987  . COLONOSCOPY WITH PROPOFOL N/A 12/05/2014   Procedure: COLONOSCOPY WITH PROPOFOL;  Surgeon: Alexandria Lame, MD;  Location: [facility] ENDOSCOPY;  Service: Endoscopy;  Laterality: N/A;  . EXTRACORPOREAL SHOCK WAVE LITHOTRIPSY Left 07/14/2017   Procedure: EXTRACORPOREAL SHOCK WAVE LITHOTRIPSY (ESWL);  Surgeon: Abbie Sons, MD;  Location: [facility] ORS;  Service: Urology;  Laterality: Left;    Prior to Admission medications   Medication Sig Start Date End Date Taking? Authorizing Provider  Ascorbic Acid (VITAMIN C) 500 MG CAPS Take by mouth daily.  06/07/14  Yes [provider]  Calcium Citrate-Vitamin D 200-250 MG-UNIT TABS Take by mouth daily.   Yes [provider]  lisinopril (ZESTRIL) 10 MG tablet TAKE 1 TABLET(10 MG) BY MOUTH DAILY 07/13/19  Yes Hubbard Hartshorn, FNP  Misc Natural Products (OSTEO BI-FLEX JOINT SHIELD) TABS Take by mouth daily.  05/14/19  Yes [provider]  Multiple Vitamin (MULTIVITAMIN) capsule Take 1 capsule by mouth daily.   Yes [provider]  alendronate (FOSAMAX) 70 MG tablet Take 1 tablet (70 mg total) by mouth every 7 (seven) days. Take with a full glass of water on an empty stomach. Patient not taking: Reported on 03/05/2020 07/13/19   Hubbard Hartshorn, FNP    Allergies as of 02/14/2020 - Review Complete 02/14/2020  Allergen  Reaction Noted  . No known allergies  07/12/2019    Family History  Problem Relation Age of Onset  . Aneurysm Mother        brain  . Hypertension Mother   . Stroke Mother        possibly mini strokes  . Heart disease Father   . Heart attack Father   . Mental retardation Father   . Cancer Brother   . Diabetes Maternal Aunt   . Hypertension Sister   . Obesity Sister   . Mental illness Sister   . Cancer Maternal Grandmother   . Hypertension Sister   . Mental illness Sister   . Depression Sister   . Hypertension Sister   . Depression Sister   . COPD Neg Hx   . Breast cancer Neg Hx     Social History   Socioeconomic History  . Marital status: Divorced    Spouse name: Not on file  . Number of children: 2  . Years of education: 48  . Highest education level: Not on file  Occupational History  . Not on file  Tobacco Use  . Smoking status: Former Smoker    Packs/day: 0.50    Years: 10.00    Pack years: 5.00    Types: Cigarettes    Quit date: 04/23/1985    Years since quitting: 34.9  . Smokeless tobacco: Never Used  Vaping Use  . Vaping Use: Never used  Substance and Sexual Activity  . Alcohol use: Yes  Comment: occasional  . Drug use: No  . Sexual activity: Not Currently    Partners: Male  Other Topics Concern  . Not on file  Social History Narrative  . Not on file   Social Determinants of Health   Financial Resource Strain:   . Difficulty of Paying Living Expenses: Not on file  Food Insecurity:   . Worried About Charity fundraiser in the Last Year: Not on file  . Ran Out of Food in the Last Year: Not on file  Transportation Needs:   . Lack of Transportation (Medical): Not on file  . Lack of Transportation (Non-Medical): Not on file  Physical Activity:   . Days of Exercise per Week: Not on file  . Minutes of Exercise per Session: Not on file  Stress:   . Feeling of Stress : Not on file  Social Connections:   . Frequency of Communication with  Friends and Family: Not on file  . Frequency of Social Gatherings with Friends and Family: Not on file  . Attends Religious Services: Not on file  . Active Member of Clubs or Organizations: Not on file  . Attends Archivist Meetings: Not on file  . Marital Status: Not on file  Intimate Partner Violence:   . Fear of Current or Ex-Partner: Not on file  . Emotionally Abused: Not on file  . Physically Abused: Not on file  . Sexually Abused: Not on file    Review of Systems: See HPI, otherwise negative ROS  Physical Exam: There were no vitals taken for this visit. General:   Alert,  pleasant and cooperative in NAD Head:  Normocephalic and atraumatic. Neck:  Supple; no masses or thyromegaly. Lungs:  Clear throughout to auscultation.    Heart:  Regular rate and rhythm. Abdomen:  Soft, nontender and nondistended. Normal bowel sounds, without guarding, and without rebound.   Neurologic:  Alert and  oriented x4;  grossly normal neurologically.  Impression/Plan: Alexandria Hill is here for an colonoscopy to be performed for history of colon polyps that were adenomas in 11/2014.  Risks, benefits, limitations, and alternatives regarding  colonoscopy have been reviewed with the patient.  Questions have been answered.  All parties agreeable.   Alexandria Lame, MD  03/17/2020, 7:40 AM

## 2020-03-17 NOTE — Anesthesia Postprocedure Evaluation (Signed)
Anesthesia Post Note  Patient: Alexandria Hill  Procedure(s) Performed: COLONOSCOPY WITH PROPOFOL (N/A Rectum)     Patient location during evaluation: PACU Anesthesia Type: General Level of consciousness: awake and alert Pain management: pain level controlled Vital Signs Assessment: post-procedure vital signs reviewed and stable Respiratory status: spontaneous breathing, nonlabored ventilation, respiratory function stable and patient connected to nasal cannula oxygen Cardiovascular status: blood pressure returned to baseline and stable Postop Assessment: no apparent nausea or vomiting Anesthetic complications: no   No complications documented.  Adele Barthel Kimela Malstrom

## 2020-03-17 NOTE — Op Note (Signed)
[facility] Gastroenterology Patient Name: Alexandria Hill Procedure Date: 03/17/2020 8:29 AM MRN: [id number] Account #: [id number] Date of Birth: Jun 26, 1955 Admit Type: Outpatient Age: 64 Room: [facility] OR ROOM 01 Gender: Female Note Status: Finalized Procedure:             Colonoscopy Indications:           High risk colon cancer surveillance: Personal history                         of colonic polyps Providers:             Lucilla Lame MD, MD Referring MD:          Bethena Roys. Sowles, MD (Referring MD) Medicines:             Propofol per Anesthesia Complications:         No immediate complications. Procedure:             Pre-Anesthesia Assessment:                        - Prior to the procedure, a History and Physical was                         performed, and patient medications and allergies were                         reviewed. The patient's tolerance of previous                         anesthesia was also reviewed. The risks and benefits                         of the procedure and the sedation options and risks                         were discussed with the patient. All questions were                         answered, and informed consent was obtained. Prior                         Anticoagulants: The patient has taken no previous                         anticoagulant or antiplatelet agents. ASA Grade                         Assessment: II - A patient with mild systemic disease.                         After reviewing the risks and benefits, the patient                         was deemed in satisfactory condition to undergo the                         procedure.  After obtaining informed consent, the colonoscope was                         passed under direct vision. Throughout the procedure,                         the patient's blood pressure, pulse, and oxygen                         saturations were monitored continuously. The                          Colonoscope was introduced through the anus and                         advanced to the the cecum, identified by appendiceal                         orifice and ileocecal valve. The colonoscopy was                         performed without difficulty. The patient tolerated                         the procedure well. The quality of the bowel                         preparation was excellent. Findings:      The perianal and digital rectal examinations were normal.      Non-bleeding internal hemorrhoids were found during retroflexion. The       hemorrhoids were Grade I (internal hemorrhoids that do not prolapse). Impression:            - Non-bleeding internal hemorrhoids.                        - No specimens collected. Recommendation:        - Discharge patient to home.                        - Resume previous diet.                        - Continue present medications.                        - Repeat colonoscopy in 7 years for surveillance. Procedure Code(s):     --- Professional ---                        [phone], Colonoscopy, flexible; diagnostic, including                         collection of specimen(s) by brushing or washing, when                         performed (separate procedure) Diagnosis Code(s):     --- Professional ---                        Z86.010, Personal history of colonic polyps  CPT copyright 2019 American Medical Association. All rights reserved. The codes documented in this report are preliminary and upon coder review may  be revised to meet current compliance requirements. Lucilla Lame MD, MD 03/17/2020 8:52:01 AM This report has been signed electronically. Number of Addenda: 0 Note Initiated On: 03/17/2020 8:29 AM Scope Withdrawal Time: 0 hours 7 minutes 54 seconds  Total Procedure Duration: 0 hours 11 minutes 28 seconds  Estimated Blood Loss:  Estimated blood loss: none.      [facility]

## 2020-03-17 NOTE — Anesthesia Procedure Notes (Signed)
Date/Time: 03/17/2020 8:36 AM Performed by: Dionne Bucy, CRNA Oxygen Delivery Method: Nasal cannula Placement Confirmation: positive ETCO2

## 2020-03-17 NOTE — Transfer of Care (Signed)
Immediate Anesthesia Transfer of Care Note  Patient: Alexandria Hill  Procedure(s) Performed: COLONOSCOPY WITH PROPOFOL (N/A Rectum)  Patient Location: PACU  Anesthesia Type: General  Level of Consciousness: awake, alert  and patient cooperative  Airway and Oxygen Therapy: Patient Spontanous Breathing and Patient connected to supplemental oxygen  Post-op Assessment: Post-op Vital signs reviewed, Patient's Cardiovascular Status Stable, Respiratory Function Stable, Patent Airway and No signs of Nausea or vomiting  Post-op Vital Signs: Reviewed and stable  Complications: No complications documented.

## 2020-03-17 NOTE — Anesthesia Preprocedure Evaluation (Signed)
Anesthesia Evaluation  Patient identified by MRN, date of birth, ID band Patient awake    History of Anesthesia Complications Negative for: history of anesthetic complications  Airway Mallampati: I  TM Distance: >3 FB Neck ROM: Full    Dental no notable dental hx.    Pulmonary former smoker,    Pulmonary exam normal        Cardiovascular Exercise Tolerance: Good hypertension, Pt. on medications Normal cardiovascular exam     Neuro/Psych negative neurological ROS     GI/Hepatic negative GI ROS, Neg liver ROS,   Endo/Other  negative endocrine ROS  Renal/GU negative Renal ROS     Musculoskeletal   Abdominal   Peds  Hematology negative hematology ROS (+)   Anesthesia Other Findings   Reproductive/Obstetrics                             Anesthesia Physical Anesthesia Plan  ASA: II  Anesthesia Plan: General   Post-op Pain Management:    Induction: Intravenous  PONV Risk Score and Plan: 3 and Propofol infusion, TIVA and Treatment may vary due to age or medical condition  Airway Management Planned: Nasal Cannula and Natural Airway  Additional Equipment: None  Intra-op Plan:   Post-operative Plan:   Informed Consent: I have reviewed the patients History and Physical, chart, labs and discussed the procedure including the risks, benefits and alternatives for the proposed anesthesia with the patient or authorized representative who has indicated his/her understanding and acceptance.       Plan Discussed with: CRNA  Anesthesia Plan Comments:         Anesthesia Quick Evaluation

## 2020-03-18 ENCOUNTER — Encounter: Payer: Self-pay | Admitting: Gastroenterology

## 2020-03-27 ENCOUNTER — Encounter: Payer: Self-pay | Admitting: Family Medicine

## 2020-03-28 ENCOUNTER — Ambulatory Visit: Payer: Self-pay | Attending: Internal Medicine

## 2020-03-28 ENCOUNTER — Other Ambulatory Visit: Payer: Self-pay | Admitting: Internal Medicine

## 2020-03-28 DIAGNOSIS — Z23 Encounter for immunization: Secondary | ICD-10-CM

## 2020-03-28 NOTE — Progress Notes (Signed)
   Covid-19 Vaccination Clinic  Name:  Alexandria Hill    MRN: [id number] DOB: [birth year]  03/28/2020  Alexandria Hill was observed post Covid-19 immunization for 15 minutes without incident. She was provided with Vaccine Information Sheet and instruction to access the V-Safe system.   Alexandria Hill was instructed to call 911 with any severe reactions post vaccine: Marland Kitchen Difficulty breathing  . Swelling of face and throat  . A fast heartbeat  . A bad rash all over body  . Dizziness and weakness

## 2020-05-12 ENCOUNTER — Other Ambulatory Visit: Payer: Self-pay

## 2020-05-12 ENCOUNTER — Encounter: Payer: 59 | Admitting: Family Medicine

## 2020-05-12 ENCOUNTER — Ambulatory Visit (INDEPENDENT_AMBULATORY_CARE_PROVIDER_SITE_OTHER): Payer: Medicare Other | Admitting: Family Medicine

## 2020-05-12 ENCOUNTER — Encounter: Payer: Self-pay | Admitting: Family Medicine

## 2020-05-12 VITALS — BP 128/76 | HR 86 | Temp 98.2°F | Resp 16 | Ht 63.0 in | Wt 145.7 lb

## 2020-05-12 DIAGNOSIS — Z23 Encounter for immunization: Secondary | ICD-10-CM | POA: Diagnosis not present

## 2020-05-12 DIAGNOSIS — Z0000 Encounter for general adult medical examination without abnormal findings: Secondary | ICD-10-CM

## 2020-05-12 DIAGNOSIS — I10 Essential (primary) hypertension: Secondary | ICD-10-CM

## 2020-05-12 DIAGNOSIS — N200 Calculus of kidney: Secondary | ICD-10-CM | POA: Insufficient documentation

## 2020-05-12 DIAGNOSIS — I700 Atherosclerosis of aorta: Secondary | ICD-10-CM | POA: Insufficient documentation

## 2020-05-12 DIAGNOSIS — M810 Age-related osteoporosis without current pathological fracture: Secondary | ICD-10-CM

## 2020-05-12 DIAGNOSIS — Z1231 Encounter for screening mammogram for malignant neoplasm of breast: Secondary | ICD-10-CM | POA: Diagnosis not present

## 2020-05-12 DIAGNOSIS — Z1211 Encounter for screening for malignant neoplasm of colon: Secondary | ICD-10-CM | POA: Diagnosis not present

## 2020-05-12 DIAGNOSIS — E785 Hyperlipidemia, unspecified: Secondary | ICD-10-CM

## 2020-05-12 MED ORDER — LISINOPRIL 10 MG PO TABS: ORAL_TABLET | ORAL | 3 refills | Status: DC

## 2020-05-12 NOTE — Patient Instructions (Signed)
Prolia, Reclast, Forteo   Preventive Care 65 Years and Older, Female Preventive care refers to lifestyle choices and visits with your health care provider that can promote health and wellness. This includes:  A yearly physical exam. This is also called an annual well check.  Regular dental and eye exams.  Immunizations.  Screening for certain conditions.  Healthy lifestyle choices, such as diet and exercise. What can I expect for my preventive care visit? Physical exam Your health care provider will check:  Height and weight. These may be used to calculate body mass index (BMI), which is a measurement that tells if you are at a healthy weight.  Heart rate and blood pressure.  Your skin for abnormal spots. Counseling Your health care provider may ask you questions about:  Alcohol, tobacco, and drug use.  Emotional well-being.  Home and relationship well-being.  Sexual activity.  Eating habits.  History of falls.  Memory and ability to understand (cognition).  Work and work Statistician.  Pregnancy and menstrual history. What immunizations do I need?  Influenza (flu) vaccine  This is recommended every year. Tetanus, diphtheria, and pertussis (Tdap) vaccine  You may need a Td booster every 10 years. Varicella (chickenpox) vaccine  You may need this vaccine if you have not already been vaccinated. Zoster (shingles) vaccine  You may need this after age 30. Pneumococcal conjugate (PCV13) vaccine  One dose is recommended after age 67. Pneumococcal polysaccharide (PPSV23) vaccine  One dose is recommended after age 57. Measles, mumps, and rubella (MMR) vaccine  You may need at least one dose of MMR if you were born in 1957 or later. You may also need a second dose. Meningococcal conjugate (MenACWY) vaccine  You may need this if you have certain conditions. Hepatitis A vaccine  You may need this if you have certain conditions or if you travel or work in  places where you may be exposed to hepatitis A. Hepatitis B vaccine  You may need this if you have certain conditions or if you travel or work in places where you may be exposed to hepatitis B. Haemophilus influenzae type b (Hib) vaccine  You may need this if you have certain conditions. You may receive vaccines as individual doses or as more than one vaccine together in one shot (combination vaccines). Talk with your health care provider about the risks and benefits of combination vaccines. What tests do I need? Blood tests  Lipid and cholesterol levels. These may be checked every 5 years, or more frequently depending on your overall health.  Hepatitis C test.  Hepatitis B test. Screening  Lung cancer screening. You may have this screening every year starting at age 4 if you have a 30-pack-year history of smoking and currently smoke or have quit within the past 15 years.  Colorectal cancer screening. All adults should have this screening starting at age 72 and continuing until age 9. Your health care provider may recommend screening at age 67 if you are at increased risk. You will have tests every 1-10 years, depending on your results and the type of screening test.  Diabetes screening. This is done by checking your blood sugar (glucose) after you have not eaten for a while (fasting). You may have this done every 1-3 years.  Mammogram. This may be done every 1-2 years. Talk with your health care provider about how often you should have regular mammograms.  BRCA-related cancer screening. This may be done if you have a family history of breast,  ovarian, tubal, or peritoneal cancers. Other tests  Sexually transmitted disease (STD) testing.  Bone density scan. This is done to screen for osteoporosis. You may have this done starting at age 28. Follow these instructions at home: Eating and drinking  Eat a diet that includes fresh fruits and vegetables, whole grains, lean protein, and  low-fat dairy products. Limit your intake of foods with high amounts of sugar, saturated fats, and salt.  Take vitamin and mineral supplements as recommended by your health care provider.  Do not drink alcohol if your health care provider tells you not to drink.  If you drink alcohol: ? Limit how much you have to 0-1 drink a day. ? Be aware of how much alcohol is in your drink. In the [place] one drink equals one 12 oz bottle of beer (355 mL), one 5 oz glass of wine (148 mL), or one 1 oz glass of hard liquor (44 mL). Lifestyle  Take daily care of your teeth and gums.  Stay active. Exercise for at least 30 minutes on 5 or more days each week.  Do not use any products that contain nicotine or tobacco, such as cigarettes, e-cigarettes, and chewing tobacco. If you need help quitting, ask your health care provider.  If you are sexually active, practice safe sex. Use a condom or other form of protection in order to prevent STIs (sexually transmitted infections).  Talk with your health care provider about taking a low-dose aspirin or statin. What's next?  Go to your health care provider once a year for a well check visit.  Ask your health care provider how often you should have your eyes and teeth checked.  Stay up to date on all vaccines. This information is not intended to replace advice given to you by your health care provider. Make sure you discuss any questions you have with your health care provider. Document Revised: 05/18/2018 Document Reviewed: 05/18/2018 Elsevier Patient Education  2020 [employer].

## 2020-05-12 NOTE — Progress Notes (Signed)
Patient: Alexandria Hill, Female    DOB: 06-Feb-1956, 64 y.o.   MRN: [id number]  Visit Date: 05/12/2020  Today's Provider: Loistine Chance, MD   Chief Complaint  Patient presents with  . Medicare Wellness    Subjective:    HPI Alexandria Hill is a 64 y.o. female who presents today for her  Welcome to medicare and follow up  HTN: used to see Dr. Fletcher Anon, but doing well, bp is at goal and he asked for [place] to prescribed the lisinopril from now on . No chest pain, palpitation or dizziness  Dyslipidemia: she was advised to stop aspirin, no longer having easy bruising. Discussed ASCVD is low but she has aorta atherosclerosis advised to start statin therapy but she prefers holding off for now   The 10-year ASCVD risk score Mikey Bussing DC Brooke Bonito., et al., 2013) is: 8.4%   Values used to calculate the score:     Age: 33 years     Sex: Female     Is Non-Hispanic African American: No     Diabetic: No     Tobacco smoker: No     Systolic Blood Pressure: 063 mmHg     Is BP treated: Yes     HDL Cholesterol: 49 mg/dL     Total Cholesterol: 190 mg/dL  Osteoporosis: she was given Fosamax in Feb, took it for a few months but developed teeth sensitivity, seen by dentist and no gum disease but she stopped medication and symptoms improved, discussed other options, she chose to try resuming medication for now and if symptoms returns she will call us back for referral to endocrinologist  Lung nodule: stable for two years no longer needs repeat testing   Patient/Caregiver input:  No concerns  Review of Systems  Constitutional: Negative for fever , positive for weight change. She states appetite is normal, she has been very stressed , she moved recently and is busy unpacking Respiratory: Negative for cough and shortness of breath.   Cardiovascular: Negative for chest pain or palpitations.  Gastrointestinal: Negative for abdominal pain, no bowel changes.  Musculoskeletal: Negative for gait problem or joint  swelling.  Skin: Negative for rash.  Neurological: Negative for dizziness or headache.  No other specific complaints in a complete review of systems (except as listed in HPI above).  Past Medical History:  Diagnosis Date  . GERD (gastroesophageal reflux disease)   . Hypertension   . IBS (irritable bowel syndrome) 2019  . Osteopenia   . Overweight (BMI 25.0-29.9) 08/11/2016  . Reflux   . Renal calculi 07/2017    Past Surgical History:  Procedure Laterality Date  . CESAREAN SECTION  1987  . COLONOSCOPY WITH PROPOFOL N/A 12/05/2014   Procedure: COLONOSCOPY WITH PROPOFOL;  Surgeon: Lucilla Lame, MD;  Location: [facility];  Service: Endoscopy;  Laterality: N/A;  . COLONOSCOPY WITH PROPOFOL N/A 03/17/2020   Procedure: COLONOSCOPY WITH PROPOFOL;  Surgeon: Lucilla Lame, MD;  Location: [facility];  Service: Endoscopy;  Laterality: N/A;  . EXTRACORPOREAL SHOCK WAVE LITHOTRIPSY Left 07/14/2017   Procedure: EXTRACORPOREAL SHOCK WAVE LITHOTRIPSY (ESWL);  Surgeon: Abbie Sons, MD;  Location: [facility] OR[facility];  Service: Urology;  Laterality: Left;    Family History  Problem Relation Age of Onset  . Aneurysm Mother        brain  . Hypertension Mother   . Stroke Mother        possibly mini strokes  . Heart disease Father   . Heart attack Father   .  Mental retardation Father   . Cancer Brother   . Diabetes Maternal Aunt   . Hypertension Sister   . Obesity Sister   . Mental illness Sister   . Cancer Maternal Grandmother   . Hypertension Sister   . Mental illness Sister   . Depression Sister   . Hypertension Sister   . Depression Sister   . COPD Neg Hx   . Breast cancer Neg Hx     Social History   Socioeconomic History  . Marital status: Divorced    Spouse name: Not on file  . Number of children: 2  . Years of education: 72  . Highest education level: Not on file  Occupational History  . Not on file  Tobacco Use  . Smoking status: Former Smoker    Packs/day: 0.50     Years: 10.00    Pack years: 5.00    Types: Cigarettes    Quit date: 04/23/1985    Years since quitting: 35.0  . Smokeless tobacco: Never Used  Vaping Use  . Vaping Use: Never used  Substance and Sexual Activity  . Alcohol use: Yes    Comment: occasional  . Drug use: No  . Sexual activity: Not Currently    Partners: Male  Other Topics Concern  . Not on file  Social History Narrative  . Not on file   Social Determinants of Health   Financial Resource Strain: Low Risk   . Difficulty of Paying Living Expenses: Not hard at all  Food Insecurity: No Food Insecurity  . Worried About Charity fundraiser in the Last Year: Never true  . Ran Out of Food in the Last Year: Never true  Transportation Needs: No Transportation Needs  . Lack of Transportation (Medical): No  . Lack of Transportation (Non-Medical): No  Physical Activity: Insufficiently Active  . Days of Exercise per Week: 4 days  . Minutes of Exercise per Session: 30 min  Stress: No Stress Concern Present  . Feeling of Stress : Only a little  Social Connections: Moderately Integrated  . Frequency of Communication with Friends and Family: More than three times a week  . Frequency of Social Gatherings with Friends and Family: More than three times a week  . Attends Religious Services: More than 4 times per year  . Active Member of Clubs or Organizations: Yes  . Attends Archivist Meetings: More than 4 times per year  . Marital Status: Divorced  Human resources officer Violence: Not At Risk  . Fear of Current or Ex-Partner: No  . Emotionally Abused: No  . Physically Abused: No  . Sexually Abused: No    Outpatient Encounter Medications as of 05/12/2020  Medication Sig  . Ascorbic Acid (VITAMIN C) 500 MG CAPS Take by mouth daily.   . Calcium Citrate-Vitamin D 200-250 MG-UNIT TABS Take by mouth daily.  Marland Kitchen lisinopril (ZESTRIL) 10 MG tablet TAKE 1 TABLET(10 MG) BY MOUTH DAILY  . Misc Natural Products (OSTEO BI-FLEX JOINT  SHIELD) TABS Take by mouth daily.   . Multiple Vitamin (MULTIVITAMIN) capsule Take 1 capsule by mouth daily.  . [DISCONTINUED] lisinopril (ZESTRIL) 10 MG tablet TAKE 1 TABLET(10 MG) BY MOUTH DAILY  . [DISCONTINUED] alendronate (FOSAMAX) 70 MG tablet Take 1 tablet (70 mg total) by mouth every 7 (seven) days. Take with a full glass of water on an empty stomach. (Patient not taking: Reported on 03/05/2020)  . [DISCONTINUED] SUPREP BOWEL PREP KIT 17.5-3.13-1.6 GM/177ML SOLN Take by mouth as directed.  No facility-administered encounter medications on file as of 05/12/2020.    Allergies  Allergen Reactions  . No Known Allergies     Care Team Updated in EHR: Yes  Last Vision Exam: Wears corrective lenses: Yes Last Dental Exam: sees dentist every 6 months  Last Hearing Exam: Wears Hearing Aids: No  Functional Ability / Safety Screening 1.  Was the timed Get Up and Go test shorter than 30 seconds?  yes 2.  Does the patient need help with the phone, transportation, shopping,      preparing meals, housework, laundry, medications, or managing money?  no 3.  Is the patient's home free of loose throw rugs in walkways, pet beds, electrical cords, etc?   no grand kids in the house, toys everywhere       Grab bars in the bathroom? no      Handrails on the stairs?   yes      Adequate lighting?   yes 4.  Has the patient noticed any hearing difficulties?   Only has difficulty with a lot of background noise    Hearing Screening   125Hz  250Hz  500Hz  1000Hz  2000Hz  3000Hz  4000Hz  6000Hz  8000Hz   Right ear:   Pass Pass Pass  Pass    Left ear:   Pass Pass Pass  Pass      Visual Acuity Screening   Right eye Left eye Both eyes  Without correction:     With correction: 13 13 13     Diet Recall and Exercise Regimen:  Advanced Care Planning: A voluntary discussion about advance care planning including the explanation and discussion of advance directives.  Discussed health care proxy and Living will, and the  patient was able to identify a health care proxy as daughter - oldest - she is Marine scientist .  Patient does have a living will at present time. If patient does have living will, I have requested they bring this to the clinic to be scanned in to their chart. Does patient have a HCPOA?    yes If yes, name and contact information: Rush Barer  [phone] Does patient have a living will or MOST form?  yes  Cancer Screenings: Skin: sees Dermatologist  Lung:  Low Dose CT Chest recommended if Age 51-80 years, 30 pack-year currently smoking OR have quit w/in 15years. Patient does not qualify. Quit over 30 years ago  Breast:  Up to date on Mammogram? Yes  Up to date of Bone Density/Dexa? Yes due in Feb  Colon: up to date   Additional Screenings: Hepatitis B/HIV/Syphillis: not interested  Hepatitis C Screening: up to date  Intimate Partner Violence: negative   Objective:   Vitals: BP 128/76   Pulse 86   Temp 98.2 F (36.8 C) (Oral)   Resp 16   Ht 5' 3"  (1.6 m)   Wt 145 lb 11.2 oz (66.1 kg)   SpO2 100%   BMI 25.81 kg/m  Body mass index is 25.81 kg/m.   Hearing Screening   125Hz  250Hz  500Hz  1000Hz  2000Hz  3000Hz  4000Hz  6000Hz  8000Hz   Right ear:   Pass Pass Pass  Pass    Left ear:   Pass Pass Pass  Pass      Visual Acuity Screening   Right eye Left eye Both eyes  Without correction:     With correction: 13 13 13     Physical Exam Constitutional: Patient appears well-developed and well-nourished. No distress.  HEENT: head atraumatic, normocephalic, pupils equal and reactive to light,  neck supple Cardiovascular:  Normal rate, regular rhythm and normal heart sounds.  No murmur heard. No BLE edema. Pulmonary/Chest: Effort normal and breath sounds normal. No respiratory distress. Abdominal: Soft.  There is no tenderness. Psychiatric: Patient has a normal mood and affect. behavior is normal. Judgment and thought content normal.  Cognitive Testing - 6-CIT  Correct? Score   What year is  it? yes 0 Yes = 0    No = 4  What month is it? yes 0 Yes = 0    No = 3  Remember:     Pia Mau, [place], [place]     What time is it? yes 0 Yes = 0    No = 3  Count backwards from 20 to 1 yes 0 Correct = 0    1 error = 2   More than 1 error = 4  Say the months of the year in reverse. yes 0 Correct = 0    1 error = 2   More than 1 error = 4  What address did I ask you to remember? yes 0 Correct = 0  1 error = 2    2 error = 4    3 error = 6    4 error = 8    All wrong = 10       TOTAL SCORE  0/28   Interpretation:  Normal  Normal (0-7) Abnormal (8-28)   Fall Risk: Fall Risk  05/12/2020 07/12/2019 05/11/2019 03/29/2019 11/15/2018  Falls in the past year? 0 0 0 0 1  Number falls in past yr: 0 0 0 0 1  Injury with Fall? 0 0 0 0 0  Follow up - - Falls evaluation completed - -    Depression Screen Depression screen [facility] 2/9 05/12/2020 07/12/2019 05/11/2019 03/29/2019 11/15/2018  Decreased Interest 0 0 0 0 0  Down, Depressed, Hopeless 0 0 0 0 0  PHQ - 2 Score 0 0 0 0 0  Altered sleeping 0 0 0 0 0  Tired, decreased energy 0 0 0 0 0  Change in appetite 0 0 0 0 0  Feeling bad or failure about yourself  0 0 0 0 0  Trouble concentrating 0 0 0 0 0  Moving slowly or fidgety/restless 0 0 0 0 0  Suicidal thoughts 0 0 0 0 0  PHQ-9 Score 0 0 0 0 0  Difficult doing work/chores - Not difficult at all Not difficult at all - Not difficult at all    Recent Results (from the past 2160 hour(s))  Novel Coronavirus, NAA ([facility])     Status: None   Collection Time: 03/05/20  9:49 AM   Specimen: Nasopharyngeal(NP) swabs in vial transport medium   Nasopharynge  Screenin  Result Value Ref Range   SARS-CoV-2, NAA Not Detected Not Detected    Comment: This nucleic acid amplification test was developed and its performance characteristics determined by [employer]ect[employer], [employer] and Company. Nucleic acid amplification tests include RT-PCR and TMA. This test has not been FDA cleared or approved. This test has been  authorized by FDA under an Emergency Use Authorization (EUA). This test is only authorized for the duration of time the declaration that circumstances exist justifying the authorization of the emergency use of in vitro diagnostic tests for detection of SARS-CoV-2 virus and/or diagnosis of COVID-19 infection under section 564(b)(1) of the Act, 21 U.S.C. 096GEZ-6(O) (1), unless the authorization is terminated or revoked sooner. When diagnostic testing is negative, the possibility of a  false negative result should be considered in the context of a patient's recent exposures and the presence of clinical signs and symptoms consistent with COVID-19. An individual without symptoms of COVID-19 and who is not shedding SARS-CoV-2 virus wo uld expect to have a negative (not detected) result in this assay.   SARS-COV-2, NAA 2 DAY TAT     Status: None   Collection Time: 03/05/20  9:49 AM   Nasopharynge  Screenin  Result Value Ref Range   SARS-CoV-2, NAA 2 DAY TAT Performed   SARS CORONAVIRUS 2 (TAT 6-24 HRS) Nasopharyngeal Nasopharyngeal Swab     Status: None   Collection Time: 03/13/20 11:27 AM   Specimen: Nasopharyngeal Swab  Result Value Ref Range   SARS Coronavirus 2 NEGATIVE NEGATIVE    Comment: (NOTE) SARS-CoV-2 target nucleic acids are NOT DETECTED.  The SARS-CoV-2 RNA is generally detectable in upper and lower respiratory specimens during the acute phase of infection. Negative results do not preclude SARS-CoV-2 infection, do not rule out co-infections with other pathogens, and should not be used as the sole basis for treatment or other patient management decisions. Negative results must be combined with clinical observations, patient history, and epidemiological information. The expected result is Negative.  Fact Sheet for Patients: SugarRoll.be  Fact Sheet for Healthcare Providers: https://www.woods-mathews.com/  This test is not yet  approved or cleared by the [place] FDA and  has been authorized for detection and/or diagnosis of SARS-CoV-2 by FDA under an Emergency Use Authorization (EUA). This EUA will remain  in effect (meaning this test can be used) for the duration of the COVID-19 declaration under Se ction 564(b)(1) of the Act, 21 U.S.C. section 360bbb-3(b)(1), unless the authorization is terminated or revoked sooner.  Performed at [facility], [place] [address] [place], [place] [address]     Assessment & Plan:    1. Welcome to Medicare preventive visit  - DG Bone Density; Future  2. Screening mammogram for breast cancer  - MM Digital Screening; Future  3. Colon cancer screening  Up to date   22. Age-related osteoporosis without current pathological fracture  - DG Bone Density; Future  5. Essential hypertension, benign  - EKG 12-Lead - lisinopril (ZESTRIL) 10 MG tablet; TAKE 1 TABLET(10 MG) BY MOUTH DAILY  Dispense: 90 tablet; Refill: 3 - COMPLETE METABOLIC PANEL WITH GFR - CBC with Differential/Platelet  6. Dyslipidemia  - Lipid panel  7. Need for vaccination for pneumococcus  - Pneumococcal polysaccharide vaccine 23-valent greater than or equal to 2yo subcutaneous/IM  8. Atherosclerosis of aorta (HCC)  There are no diagnoses linked to this encounter.    Exercise Activities and Dietary recommendations  She will make sure she gets 150 minutes of physical activity weekly   - Discussed health benefits of physical activity, and encouraged her to engage in regular exercise appropriate for her age and condition.   Immunization History  Administered Date(s) Administered  . Fluad Quad(high Dose 65+) 03/06/2019, 03/04/2020  . Influenza Inj Mdck Quad Pf 03/05/2018  . Influenza,inj,Quad PF,6+ Mos 03/08/2017  . Influenza-Unspecified 04/07/2015, 03/05/2018  . PFIZER SARS-COV-2 Vaccination 03/28/2020  . Pneumococcal Conjugate-13 03/06/2019  . Tdap 01/06/2012, 12/15/2013  .  Zoster Recombinat (Shingrix) 06/05/2019, 10/03/2019    Health Maintenance  Topic Date Due  . PNA vac Low Risk Adult (2 of 2 - PPSV23) 03/05/2020  . COVID-19 Vaccine (2 - Pfizer 2-dose series) 04/18/2020  . MAMMOGRAM  07/09/2020  . DEXA SCAN  07/09/2021  . TETANUS/TDAP  12/16/2023  . COLONOSCOPY  03/18/2027  . INFLUENZA VACCINE  Completed  . Hepatitis C Screening  Completed    Meds ordered this encounter  Medications  . lisinopril (ZESTRIL) 10 MG tablet    Sig: TAKE 1 TABLET(10 MG) BY MOUTH DAILY    Dispense:  90 tablet    Refill:  3    Current Outpatient Medications:  .  Ascorbic Acid (VITAMIN C) 500 MG CAPS, Take by mouth daily. , Disp: , Rfl:  .  Calcium Citrate-Vitamin D 200-250 MG-UNIT TABS, Take by mouth daily., Disp: , Rfl:  .  lisinopril (ZESTRIL) 10 MG tablet, TAKE 1 TABLET(10 MG) BY MOUTH DAILY, Disp: 90 tablet, Rfl: 3 .  Misc Natural Products (OSTEO BI-FLEX JOINT SHIELD) TABS, Take by mouth daily. , Disp: , Rfl:  .  Multiple Vitamin (MULTIVITAMIN) capsule, Take 1 capsule by mouth daily., Disp: , Rfl:  Medications Discontinued During This Encounter  Medication Reason  . alendronate (FOSAMAX) 70 MG tablet Patient Preference  . SUPREP BOWEL PREP KIT 17.5-3.13-1.6 GM/177ML SOLN Error  . lisinopril (ZESTRIL) 10 MG tablet Reorder    I have personally reviewed and addressed the Medicare Annual Wellness health risk assessment questionnaire and have noted the following in the patient's chart:  A.         Medical and social history & family history B.         Use of alcohol, tobacco, and illicit drugs  C.         Current medications and supplements D.         Functional and Cognitive ability and status E.         Nutritional status F.         Physical activity G.        Advance directives H.         List of other physicians I.          Hospitalizations, surgeries, and ER visits in previous 12 months J.         [place] such as hearing, vision,  cognitive function, and depression L.         Referrals and appointments: none   In addition, I have reviewed and discussed with patient certain preventive protocols, quality metrics, and best practice recommendations. A written personalized care plan for preventive services as well as general preventive health recommendations were provided to patient.   See attached scanned questionnaire for additional information.

## 2020-05-12 NOTE — Addendum Note (Signed)
Addended by: Steele Sizer F on: 05/12/2020 01:23 PM   Modules accepted: Level of Service

## 2020-05-13 LAB — COMPLETE METABOLIC PANEL WITH GFR
AG Ratio: 1.6 (calc) (ref 1.0–2.5)
ALT: 16 U/L (ref 6–29)
AST: 18 U/L (ref 10–35)
Albumin: 4.4 g/dL (ref 3.6–5.1)
Alkaline phosphatase (APISO): 67 U/L (ref 37–153)
BUN: 17 mg/dL (ref 7–25)
CO2: 30 mmol/L (ref 20–32)
Calcium: 10.1 mg/dL (ref 8.6–10.4)
Chloride: 104 mmol/L (ref 98–110)
Creat: 0.68 mg/dL (ref 0.50–0.99)
GFR, Est African American: 106 mL/min/{1.73_m2} (ref >=60)
GFR, Est Non African American: 91 mL/min/{1.73_m2} (ref >=60)
Globulin: 2.8 g/dL (calc) (ref 1.9–3.7)
Glucose, Bld: 85 mg/dL (ref 65–99)
Potassium: 4.6 mmol/L (ref 3.5–5.3)
Sodium: 139 mmol/L (ref 135–146)
Total Bilirubin: 0.4 mg/dL (ref 0.2–1.2)
Total Protein: 7.2 g/dL (ref 6.1–8.1)

## 2020-05-13 LAB — CBC WITH DIFFERENTIAL/PLATELET
Absolute Monocytes: 454 cells/uL (ref 200–950)
Basophils Absolute: 28 cells/uL (ref 0–200)
Basophils Relative: 0.5 %
Eosinophils Absolute: 73 cells/uL (ref 15–500)
Eosinophils Relative: 1.3 %
HCT: 41.3 % (ref 35.0–45.0)
Hemoglobin: 13.7 g/dL (ref 11.7–15.5)
Lymphs Abs: 1585 cells/uL (ref 850–3900)
MCH: 31.2 pg (ref 27.0–33.0)
MCHC: 33.2 g/dL (ref 32.0–36.0)
MCV: 94.1 fL (ref 80.0–100.0)
MPV: 9.6 fL (ref 7.5–12.5)
Monocytes Relative: 8.1 %
Neutro Abs: 3461 cells/uL (ref 1500–7800)
Neutrophils Relative %: 61.8 %
Platelets: 160 10*3/uL (ref 140–400)
RBC: 4.39 10*6/uL (ref 3.80–5.10)
RDW: 12.2 % (ref 11.0–15.0)
Total Lymphocyte: 28.3 %
WBC: 5.6 10*3/uL (ref 3.8–10.8)

## 2020-05-13 LAB — LIPID PANEL
Cholesterol: 187 mg/dL (ref <200)
HDL: 56 mg/dL (ref >=50)
LDL Cholesterol (Calc): 110 mg/dL (calc) — ABNORMAL HIGH
Non-HDL Cholesterol (Calc): 131 mg/dL (calc) — ABNORMAL HIGH (ref <130)
Total CHOL/HDL Ratio: 3.3 (calc) (ref <5.0)
Triglycerides: 106 mg/dL (ref <150)

## 2020-05-16 ENCOUNTER — Encounter: Payer: Self-pay | Admitting: Family Medicine

## 2020-05-16 ENCOUNTER — Other Ambulatory Visit: Payer: Self-pay | Admitting: Family Medicine

## 2020-05-16 ENCOUNTER — Other Ambulatory Visit: Payer: Self-pay

## 2020-05-16 DIAGNOSIS — M810 Age-related osteoporosis without current pathological fracture: Secondary | ICD-10-CM

## 2020-05-16 MED ORDER — ALENDRONATE SODIUM 70 MG PO TABS: 70.0000 mg | ORAL_TABLET | ORAL | 11 refills | Status: DC

## 2020-07-15 ENCOUNTER — Ambulatory Visit
Admission: RE | Admit: 2020-07-15 | Discharge: 2020-07-15 | Disposition: A | Discharge status: Home / Self Care | Payer: Medicare Other | Source: Ambulatory Visit | Attending: Family Medicine | Admitting: Family Medicine

## 2020-07-15 ENCOUNTER — Other Ambulatory Visit: Payer: Self-pay

## 2020-07-15 DIAGNOSIS — M810 Age-related osteoporosis without current pathological fracture: Secondary | ICD-10-CM | POA: Diagnosis not present

## 2020-07-15 DIAGNOSIS — Z1231 Encounter for screening mammogram for malignant neoplasm of breast: Secondary | ICD-10-CM | POA: Insufficient documentation

## 2020-07-15 DIAGNOSIS — Z0000 Encounter for general adult medical examination without abnormal findings: Secondary | ICD-10-CM | POA: Insufficient documentation

## 2020-07-30 ENCOUNTER — Other Ambulatory Visit: Payer: Self-pay

## 2020-07-30 DIAGNOSIS — M810 Age-related osteoporosis without current pathological fracture: Secondary | ICD-10-CM

## 2020-11-11 ENCOUNTER — Ambulatory Visit: Payer: Medicare Other | Admitting: Family Medicine

## 2020-11-18 ENCOUNTER — Other Ambulatory Visit: Payer: Self-pay

## 2020-11-18 ENCOUNTER — Ambulatory Visit (INDEPENDENT_AMBULATORY_CARE_PROVIDER_SITE_OTHER): Payer: Medicare Other | Admitting: Family Medicine

## 2020-11-18 ENCOUNTER — Encounter: Payer: Self-pay | Admitting: Family Medicine

## 2020-11-18 VITALS — BP 126/78 | HR 77 | Temp 97.9°F | Resp 16 | Ht 63.0 in | Wt 148.0 lb

## 2020-11-18 DIAGNOSIS — E785 Hyperlipidemia, unspecified: Secondary | ICD-10-CM | POA: Diagnosis not present

## 2020-11-18 DIAGNOSIS — I10 Essential (primary) hypertension: Secondary | ICD-10-CM

## 2020-11-18 DIAGNOSIS — I700 Atherosclerosis of aorta: Secondary | ICD-10-CM | POA: Diagnosis not present

## 2020-11-18 DIAGNOSIS — M810 Age-related osteoporosis without current pathological fracture: Secondary | ICD-10-CM | POA: Diagnosis not present

## 2020-11-18 NOTE — Progress Notes (Signed)
Name: Alexandria Hill   MRN: [id number]    DOB: 1956-04-29   Date:11/18/2020       Progress Note  Subjective  Chief Complaint  Follow Up  HPI  HTN: used to see Dr. Fletcher Anon, but doing well, bbp has been at goal, taking lisinopril . No chest pain, palpitation or dizziness  Dyslipidemia: she was advised to stop aspirin, no longer having easy bruising. Discussed ASCVD is low but she has aorta atherosclerosis advised to start statin therapy again, but she would like to hold off. Explained risk below not very accurate since she already has plaque formation   The 10-year ASCVD risk score Mikey Bussing DC Jr., et al., 2013) is: 8.6%   Values used to calculate the score:     Age: 65 years     Sex: Female     Is Non-Hispanic African American: No     Diabetic: No     Tobacco smoker: No     Systolic Blood Pressure: 403 mmHg     Is BP treated: Yes     HDL Cholesterol: 56 mg/dL     Total Cholesterol: 187 mg/dL   Osteoporosis: she is back on Fosamax, she is up to date with dentist. She took for a few months from 07/2019 for 3 months, stopped and resumed in Dec, bone density showed improvement, she is also taking calcium and vitamin D and we will recheck bone density in 2 years   Lung nodule: stable for two years no longer needs repeat testing . Unchanged    Patient Active Problem List   Diagnosis Date Noted   Kidney stone 05/12/2020   Atherosclerosis of aorta ([place]) 05/12/2020   History of colonic polyps    Pain of left hip joint 04/27/2019   Recurrent UTI 01/30/2019   Preventative health care 05/09/2018   Lung nodule, solitary 05/09/2018   IBS (irritable bowel syndrome) 11/14/2017   Sciatic nerve pain, right 11/14/2017   Personal history of urinary calculi 11/14/2017   Essential hypertension, benign 08/11/2016   Overweight (BMI 25.0-29.9) 08/11/2016   Heartburn 08/11/2016   Varicella exposure 04/24/2015   Osteopenia     Past Surgical History:  Procedure Laterality Date   CESAREAN SECTION   1987   COLONOSCOPY WITH PROPOFOL N/A 12/05/2014   Procedure: COLONOSCOPY WITH PROPOFOL;  Surgeon: Lucilla Lame, MD;  Location: [facility];  Service: Endoscopy;  Laterality: N/A;   COLONOSCOPY WITH PROPOFOL N/A 03/17/2020   Procedure: COLONOSCOPY WITH PROPOFOL;  Surgeon: Lucilla Lame, MD;  Location: [facility];  Service: Endoscopy;  Laterality: N/A;   EXTRACORPOREAL SHOCK WAVE LITHOTRIPSY Left 07/14/2017   Procedure: EXTRACORPOREAL SHOCK WAVE LITHOTRIPSY (ESWL);  Surgeon: Abbie Sons, MD;  Location: [facility];  Service: Urology;  Laterality: Left;    Family History  Problem Relation Age of Onset   Aneurysm Mother        brain   Hypertension Mother    Stroke Mother        possibly mini strokes   Heart disease Father    Heart attack Father    Mental retardation Father    Cancer Brother    Diabetes Maternal Aunt    Hypertension Sister    Obesity Sister    Mental illness Sister    Cancer Maternal Grandmother    Hypertension Sister    Mental illness Sister    Depression Sister    Hypertension Sister    Depression Sister    COPD Neg Hx    Breast  cancer Neg Hx     Social History   Tobacco Use   Smoking status: Former    Packs/day: 0.50    Years: 10.00    Pack years: 5.00    Types: Cigarettes    Quit date: 04/23/1985    Years since quitting: 35.5   Smokeless tobacco: Never  Substance Use Topics   Alcohol use: Yes    Comment: occasional     Current Outpatient Medications:    alendronate (FOSAMAX) 70 MG tablet, Take 1 tablet (70 mg total) by mouth every 7 (seven) days. Take with a full glass of water on an empty stomach., Disp: 4 tablet, Rfl: 11   Ascorbic Acid (VITAMIN C) 500 MG CAPS, Take by mouth daily. , Disp: , Rfl:    Calcium Citrate-Vitamin D 200-250 MG-UNIT TABS, Take by mouth daily., Disp: , Rfl:    COVID-19 mRNA vaccine, Pfizer, 30 MCG/0.3ML injection, USE AS DIRECTED, Disp: .3 mL, Rfl: 0   lisinopril (ZESTRIL) 10 MG tablet, TAKE 1 TABLET(10 MG) BY  MOUTH DAILY, Disp: 90 tablet, Rfl: 3   Misc Natural Products (OSTEO BI-FLEX JOINT SHIELD) TABS, Take by mouth daily. , Disp: , Rfl:    Multiple Vitamin (MULTIVITAMIN) capsule, Take 1 capsule by mouth daily., Disp: , Rfl:   Allergies  Allergen Reactions   No Known Allergies     I personally reviewed active problem list, medication list, allergies, family history, social history, health maintenance with the patient/caregiver today.   ROS  Constitutional: Negative for fever or weight change.  Respiratory: Negative for cough and shortness of breath.   Cardiovascular: Negative for chest pain or palpitations.  Gastrointestinal: Negative for abdominal pain, no bowel changes.  Musculoskeletal: Negative for gait problem or joint swelling.  Skin: Negative for rash.  Neurological: Negative for dizziness or headache.  No other specific complaints in a complete review of systems (except as listed in HPI above).   Objective  Vitals:   11/18/20 0902  BP: 126/78  Pulse: 77  Resp: 16  Temp: 97.9 F (36.6 C)  TempSrc: Oral  SpO2: 99%  Weight: 148 lb (67.1 kg)  Height: 5\' 3"  (1.6 m)    Body mass index is 26.22 kg/m.  Physical Exam  Constitutional: Patient appears well-developed and well-nourished. No distress.  HEENT: head atraumatic, normocephalic, pupils equal and reactive to light,  neck supple, throat within normal limits Cardiovascular: Normal rate, regular rhythm and normal heart sounds.  No murmur heard. No BLE edema. Pulmonary/Chest: Effort normal and breath sounds normal. No respiratory distress. Abdominal: Soft.  There is no tenderness. Psychiatric: Patient has a normal mood and affect. behavior is normal. Judgment and thought content normal.   PHQ2/9: Depression screen [facility] 2/9 11/18/2020 05/12/2020 07/12/2019 05/11/2019 03/29/2019  Decreased Interest 0 0 0 0 0  Down, Depressed, Hopeless 0 0 0 0 0  PHQ - 2 Score 0 0 0 0 0  Altered sleeping - 0 0 0 0  Tired, decreased energy  - 0 0 0 0  Change in appetite - 0 0 0 0  Feeling bad or failure about yourself  - 0 0 0 0  Trouble concentrating - 0 0 0 0  Moving slowly or fidgety/restless - 0 0 0 0  Suicidal thoughts - 0 0 0 0  PHQ-9 Score - 0 0 0 0  Difficult doing work/chores - - Not difficult at all Not difficult at all -  Some recent data might be hidden    phq 9 is  negative   Fall Risk: Fall Risk  11/18/2020 05/12/2020 07/12/2019 05/11/2019 03/29/2019  Falls in the past year? 0 0 0 0 0  Number falls in past yr: 0 0 0 0 0  Injury with Fall? 0 0 0 0 0  Follow up - - - Falls evaluation completed -     Functional Status Survey: Is the patient deaf or have difficulty hearing?: No Does the patient have difficulty seeing, even when wearing glasses/contacts?: No Does the patient have difficulty concentrating, remembering, or making decisions?: No Does the patient have difficulty walking or climbing stairs?: No Does the patient have difficulty dressing or bathing?: No Does the patient have difficulty doing errands alone such as visiting a doctor's office or shopping?: No    Assessment & Plan  1. Atherosclerosis of aorta ([place])  Refuses starting statin at this time  2. Essential hypertension, benign  Continue lisinopril   3. Dyslipidemia  Recheck yearly   4. Age-related osteoporosis without current pathological fracture  On fosamax

## 2020-12-01 ENCOUNTER — Encounter: Payer: Self-pay | Admitting: Family Medicine

## 2020-12-02 ENCOUNTER — Other Ambulatory Visit: Payer: Self-pay | Admitting: Family Medicine

## 2020-12-02 DIAGNOSIS — Z20828 Contact with and (suspected) exposure to other viral communicable diseases: Secondary | ICD-10-CM

## 2020-12-02 MED ORDER — OSELTAMIVIR PHOSPHATE 75 MG PO CAPS: 75.0000 mg | ORAL_CAPSULE | Freq: Every day | ORAL | 0 refills | Status: DC

## 2020-12-15 ENCOUNTER — Encounter: Payer: Self-pay | Admitting: Family Medicine

## 2021-02-17 ENCOUNTER — Other Ambulatory Visit: Payer: Self-pay

## 2021-02-17 DIAGNOSIS — M810 Age-related osteoporosis without current pathological fracture: Secondary | ICD-10-CM

## 2021-03-10 ENCOUNTER — Other Ambulatory Visit: Payer: Self-pay

## 2021-03-10 ENCOUNTER — Ambulatory Visit (INDEPENDENT_AMBULATORY_CARE_PROVIDER_SITE_OTHER): Payer: Medicare Other | Admitting: Emergency Medicine

## 2021-03-10 DIAGNOSIS — Z23 Encounter for immunization: Secondary | ICD-10-CM

## 2021-03-27 ENCOUNTER — Other Ambulatory Visit: Payer: Self-pay | Admitting: Family Medicine

## 2021-03-28 ENCOUNTER — Other Ambulatory Visit: Payer: Self-pay | Admitting: Family Medicine

## 2021-03-28 DIAGNOSIS — M810 Age-related osteoporosis without current pathological fracture: Secondary | ICD-10-CM

## 2021-03-28 NOTE — Telephone Encounter (Signed)
Requested medication (s) are due for refill today: should be due in December 22  Requested medication (s) are on the active medication list: yes  Last refill:  05/16/20 #4 11 RF  Future visit scheduled: yes  Notes to clinic:  Requesting early per note on request.  Last Vitamin D level 05/11/19   Requested Prescriptions  Pending Prescriptions Disp Refills   alendronate (FOSAMAX) 70 MG tablet [Pharmacy Med Name: ALENDRONATE 70MG  TABLETS] 12 tablet     Sig: TAKE 1 TABLET(70 MG) BY MOUTH EVERY 7 DAYS WITH A FULL GLASS OF WATER AND ON AN EMPTY STOMACH     Endocrinology:  Bisphosphonates Failed - 03/28/2021  8:04 AM      Failed - Vitamin D in normal range and within 360 days    Vit D, 25-Hydroxy  Date Value Ref Range Status  05/11/2019 44 30 - 100 ng/mL Final    Comment:    Vitamin D Status         25-OH Vitamin D: . Deficiency:                    <20 ng/mL Insufficiency:             20 - 29 ng/mL Optimal:                 > or = 30 ng/mL . For 25-OH Vitamin D testing on patients on  D2-supplementation and patients for whom quantitation  of D2 and D3 fractions is required, the [employer](TM) 25-OH VIT D, (D2,D3), LC/MS/MS is recommended: order  code [id number] (patients >13yrs). See Note 1 . Note 1 . For additional information, please refer to  http://education.[facility].com/faq/FAQ199  (This link is being provided for informational/ educational purposes only.)           Passed - Ca in normal range and within 360 days    Calcium  Date Value Ref Range Status  05/12/2020 10.1 8.6 - 10.4 mg/dL Final          Passed - Valid encounter within last 12 months    Recent Outpatient Visits           4 months ago Atherosclerosis of aorta [facility])   [facility] Steele Sizer, MD   10 months ago Atherosclerosis of aorta [facility])   [facility] [place], Drue Stager, MD   1 year ago Acute low back pain without sciatica, unspecified back pain  laterality   [place], [place], FNP   1 year ago Well woman exam (no gynecological exam)   [place], FNP   2 years ago Hip pain, chronic, left   [facility] Steele Sizer, MD       Future Appointments             In 1 month Ancil Boozer, Drue Stager, MD [facility] [place] [facility]

## 2021-03-30 ENCOUNTER — Other Ambulatory Visit: Payer: Self-pay

## 2021-05-11 DIAGNOSIS — M545 Low back pain, unspecified: Secondary | ICD-10-CM | POA: Insufficient documentation

## 2021-05-15 ENCOUNTER — Other Ambulatory Visit: Payer: Self-pay | Admitting: Family Medicine

## 2021-05-15 DIAGNOSIS — I10 Essential (primary) hypertension: Secondary | ICD-10-CM

## 2021-05-19 NOTE — Progress Notes (Signed)
Name: Alexandria Hill   MRN: [id number]    DOB: January 14, 1956   Date:05/20/2021       Progress Note  Subjective  Chief Complaint  Follow Up  HPI  HTN: used to see Dr. Fletcher Anon, but doing well, bp is at goal , no chest pain or palpitation   Dyslipidemia/Atherosclerosis  she was advised to stop aspirin, no longer having easy bruising. Discussed ASCVD is low but she has aorta atherosclerosis advised to start statin therapy again,but she is not interested   The 10-year ASCVD risk score (Arnett DK, et al., 2019) is: 7.8%   Values used to calculate the score:     Age: 65 years     Sex: Female     Is Non-Hispanic African American: No     Diabetic: No     Tobacco smoker: No     Systolic Blood Pressure: 169 mmHg     Is BP treated: Yes     HDL Cholesterol: 56 mg/dL     Total Cholesterol: 187 mg/dL   Osteoporosis: she is back on Fosamax, she is up to date with dentist. She took for a few months from 07/2019 for 3 months, stopped and resumed in Dec 21  bone density showed improvement 07/2020. She is on high calcium diet and takes vitamin D otc   Lung nodule: stable for two years no longer needs repeat testing . Unchanged   Left shoulder pain: she states two months ago she noticed pain with internal rotation of left shoulder and abduction of left shoulder. No redness or increase in warmth. She has tylenol prn , afraid of Ibuprofen because it raises her bp. She is avoiding sleeping on left side due to pain.   Patient Active Problem List   Diagnosis Date Noted   Low back pain 05/11/2021   Age-related osteoporosis without current pathological fracture 11/18/2020   Atherosclerosis of aorta ([place]) 05/12/2020   History of colonic polyps    Recurrent UTI 01/30/2019   Lung nodule, solitary 05/09/2018   IBS (irritable bowel syndrome) 11/14/2017   Sciatic nerve pain, right 11/14/2017   Personal history of urinary calculi 11/14/2017   Essential hypertension, benign 08/11/2016   Overweight (BMI  25.0-29.9) 08/11/2016    Past Surgical History:  Procedure Laterality Date   CESAREAN SECTION  1987   COLONOSCOPY WITH PROPOFOL N/A 12/05/2014   Procedure: COLONOSCOPY WITH PROPOFOL;  Surgeon: Lucilla Lame, MD;  Location: [facility];  Service: Endoscopy;  Laterality: N/A;   COLONOSCOPY WITH PROPOFOL N/A 03/17/2020   Procedure: COLONOSCOPY WITH PROPOFOL;  Surgeon: Lucilla Lame, MD;  Location: [facility];  Service: Endoscopy;  Laterality: N/A;   EXTRACORPOREAL SHOCK WAVE LITHOTRIPSY Left 07/14/2017   Procedure: EXTRACORPOREAL SHOCK WAVE LITHOTRIPSY (ESWL);  Surgeon: Abbie Sons, MD;  Location: [facility];  Service: Urology;  Laterality: Left;    Family History  Problem Relation Age of Onset   Aneurysm Mother        brain   Hypertension Mother    Stroke Mother        possibly mini strokes   Heart disease Father    Heart attack Father    Mental retardation Father    Cancer Brother    Diabetes Maternal Aunt    Hypertension Sister    Obesity Sister    Mental illness Sister    Cancer Maternal Grandmother    Hypertension Sister    Mental illness Sister    Depression Sister    Hypertension Sister  Depression Sister    COPD Neg Hx    Breast cancer Neg Hx     Social History   Tobacco Use   Smoking status: Former    Packs/day: 0.50    Years: 10.00    Pack years: 5.00    Types: Cigarettes    Quit date: 04/23/1985    Years since quitting: 36.0   Smokeless tobacco: Never  Substance Use Topics   Alcohol use: Yes    Comment: occasional     Current Outpatient Medications:    alendronate (FOSAMAX) 70 MG tablet, TAKE 1 TABLET(70 MG) BY MOUTH EVERY 7 DAYS WITH A FULL GLASS OF WATER AND ON AN EMPTY STOMACH, Disp: 12 tablet, Rfl: 0   Ascorbic Acid (VITAMIN C) 500 MG CAPS, Take by mouth daily. , Disp: , Rfl:    BINAXNOW COVID-19 AG HOME TEST KIT, TEST AS DIRECTED TODAY, Disp: 2 each, Rfl: 0   Calcium Citrate-Vitamin D 200-250 MG-UNIT TABS, Take by mouth daily., Disp:  , Rfl:    lisinopril (ZESTRIL) 10 MG tablet, TAKE 1 TABLET(10 MG) BY MOUTH DAILY, Disp: 90 tablet, Rfl: 0   Misc Natural Products (OSTEO BI-FLEX JOINT SHIELD) TABS, Take by mouth daily. , Disp: , Rfl:    Multiple Vitamin (MULTIVITAMIN) capsule, Take 1 capsule by mouth daily., Disp: , Rfl:    prednisoLONE acetate (PRED FORTE) 1 % ophthalmic suspension, Place into the left eye. (Patient not taking: Reported on 05/20/2021), Disp: , Rfl:   Allergies  Allergen Reactions   No Known Allergies     I personally reviewed active problem list, medication list, allergies, family history, social history, health maintenance with the patient/caregiver today.   ROS  Constitutional: Negative for fever or weight change.  Respiratory: Negative for cough and shortness of breath.   Cardiovascular: Negative for chest pain or palpitations.  Gastrointestinal: Negative for abdominal pain, no bowel changes.  Musculoskeletal: Negative for gait problem or joint swelling.  Skin: Negative for rash.  Neurological: Negative for dizziness or headache.  No other specific complaints in a complete review of systems (except as listed in HPI above).   Objective  Vitals:   05/20/21 0812  BP: 120/78  Pulse: 80  Resp: 16  Temp: 97.9 F (36.6 C)  TempSrc: Oral  SpO2: 98%  Weight: 148 lb 3.2 oz (67.2 kg)  Height: '5\' 3"'  (1.6 m)    Body mass index is 26.25 kg/m.  Physical Exam  Constitutional: Patient appears well-developed and well-nourished.  No distress.  HEENT: head atraumatic, normocephalic, pupils equal and reactive to light, neck supple Cardiovascular: Normal rate, regular rhythm and normal heart sounds.  No murmur heard. No BLE edema. Pulmonary/Chest: Effort normal and breath sounds normal. No respiratory distress. Abdominal: Soft.  There is no tenderness. Muscular Skeletal: decrease in abduction, internal rotation of left shoulder.  Psychiatric: Patient has a normal mood and affect. behavior is normal.  Judgment and thought content normal.    PHQ2/9: Depression screen [facility] 2/9 05/20/2021 11/18/2020 05/12/2020 07/12/2019 05/11/2019  Decreased Interest 0 0 0 0 0  Down, Depressed, Hopeless 0 0 0 0 0  PHQ - 2 Score 0 0 0 0 0  Altered sleeping - - 0 0 0  Tired, decreased energy - - 0 0 0  Change in appetite - - 0 0 0  Feeling bad or failure about yourself  - - 0 0 0  Trouble concentrating - - 0 0 0  Moving slowly or fidgety/restless - - 0 0 0  Suicidal thoughts - - 0 0 0  PHQ-9 Score - - 0 0 0  Difficult doing work/chores - - - Not difficult at all Not difficult at all  Some recent data might be hidden    phq 9 is negative   Fall Risk: Fall Risk  05/20/2021 11/18/2020 05/12/2020 07/12/2019 05/11/2019  Falls in the past year? 1 0 0 0 0  Number falls in past yr: 0 0 0 0 0  Injury with Fall? 0 0 0 0 0  Risk for fall due to : History of fall(s) - - - -  Follow up Falls prevention discussed - - - Falls evaluation completed      Functional Status Survey: Is the patient deaf or have difficulty hearing?: No Does the patient have difficulty seeing, even when wearing glasses/contacts?: No Does the patient have difficulty concentrating, remembering, or making decisions?: No Does the patient have difficulty walking or climbing stairs?: No Does the patient have difficulty dressing or bathing?: No Does the patient have difficulty doing errands alone such as visiting a doctor's office or shopping?: No    Assessment & Plan  1. Essential hypertension, benign  - CBC with Differential/Platelet - COMPLETE METABOLIC PANEL WITH GFR - lisinopril (ZESTRIL) 10 MG tablet; Take 1 tablet (10 mg total) by mouth daily.  Dispense: 90 tablet; Refill: 3  2. Dyslipidemia  - Lipid panel  3. Atherosclerosis of aorta ([facility])  - Lipid panel  4. Age-related osteoporosis without current pathological fracture   5. History of kidney stones   6. Breast cancer screening by mammogram  - MM 3D SCREEN BREAST  BILATERAL; Future  7. Rotator cuff syndrome of left shoulder  - meloxicam (MOBIC) 15 MG tablet; Take 1 tablet (15 mg total) by mouth daily.  Dispense: 30 tablet; Refill: 0   She will try home PT and medication above and call back for referral to Ortho and or sports medicine is no improvement

## 2021-05-20 ENCOUNTER — Encounter: Payer: Self-pay | Admitting: Family Medicine

## 2021-05-20 ENCOUNTER — Ambulatory Visit (INDEPENDENT_AMBULATORY_CARE_PROVIDER_SITE_OTHER): Payer: Medicare Other | Admitting: Family Medicine

## 2021-05-20 ENCOUNTER — Other Ambulatory Visit: Payer: Self-pay | Admitting: Family Medicine

## 2021-05-20 ENCOUNTER — Other Ambulatory Visit: Payer: Self-pay

## 2021-05-20 VITALS — BP 120/78 | HR 80 | Temp 97.9°F | Resp 16 | Ht 63.0 in | Wt 148.2 lb

## 2021-05-20 DIAGNOSIS — E785 Hyperlipidemia, unspecified: Secondary | ICD-10-CM

## 2021-05-20 DIAGNOSIS — I10 Essential (primary) hypertension: Secondary | ICD-10-CM

## 2021-05-20 DIAGNOSIS — Z1231 Encounter for screening mammogram for malignant neoplasm of breast: Secondary | ICD-10-CM

## 2021-05-20 DIAGNOSIS — M810 Age-related osteoporosis without current pathological fracture: Secondary | ICD-10-CM

## 2021-05-20 DIAGNOSIS — I700 Atherosclerosis of aorta: Secondary | ICD-10-CM

## 2021-05-20 DIAGNOSIS — M75102 Unspecified rotator cuff tear or rupture of left shoulder, not specified as traumatic: Secondary | ICD-10-CM

## 2021-05-20 DIAGNOSIS — Z87442 Personal history of urinary calculi: Secondary | ICD-10-CM

## 2021-05-20 MED ORDER — MELOXICAM 15 MG PO TABS: 15.0000 mg | ORAL_TABLET | Freq: Every day | ORAL | 0 refills | Status: DC

## 2021-05-20 MED ORDER — LISINOPRIL 10 MG PO TABS: 10.0000 mg | ORAL_TABLET | Freq: Every day | ORAL | 3 refills | Status: DC

## 2021-05-20 NOTE — Patient Instructions (Signed)
Rotator Cuff Tear Rehab After Surgery Ask your health care provider which exercises are safe for you. Do exercises exactly as told by your health care provider and adjust them as directed. It is normal to feel mild stretching, pulling, tightness, or discomfort as you do these exercises. Stop right away if you feel sudden pain or your pain gets worse. Do not begin these exercises until told by your health care provider. Stretching and range-of-motion exercises These exercises warm up your muscles and joints and improve the movement and flexibility of your shoulder. These exercises also help to relieve pain. Shoulder pendulum In this exercise, you let the injured arm dangle toward the floor and then swing it like a clock pendulum. Stand near a table or counter that you can hold onto for balance. Bend forward at the waist and let your left / right arm hang straight down. Use your other arm to support you and help you stay balanced. Relax your left / right arm and shoulder muscles, and move your hips and your trunk so your left / right arm swings freely. Your arm should swing because of the motion of your body, not because you are using your arm or shoulder muscles. Keep moving your hips and trunk so your arm swings in the following directions, as told by your health care provider: Side to side. Forward and backward. In clockwise and counterclockwise circles. Slowly return to the starting position. Repeat __________ times, or for __________ seconds per direction. Complete this exercise __________ times a day. Shoulder flexion, seated This exercise is sometimes called table slides. In this exercise, you raise your arm in front of your body until you feel a stretch in your injured shoulder. Sit in a stable chair so your left / right forearm can rest on a flat surface. Your elbow should rest at a height that keeps your upper arm next to your body. Keeping your left / right shoulder relaxed, lean forward  at the waist and let your hand slide forward (flexion). Stop when you feel a stretch in your shoulder, or when you reach the angle that is recommended by your health care provider. Hold for __________ seconds. Slowly return to the starting position. Repeat __________ times. Complete this exercise __________ times a day. Shoulder flexion, standing In this exercise, you raise your arm in front of your body (flexion) until you feel a stretch in your injured shoulder. Stand and hold a broomstick, a cane, or a similar object. Place your hands a little more than shoulder-width apart on the object. Your left / right hand should be palm-up, and your other hand should be palm-down. Keep your elbow straight and your shoulder muscles relaxed. Push the stick up with your healthy arm to raise your left / right arm in front of your body, and then over your head until you feel a stretch in your shoulder. Avoid shrugging your shoulder while you raise your arm. Keep your shoulder blade tucked down toward the middle of your back. Keep your left / right shoulder muscles relaxed. Hold for __________ seconds. Slowly return to the starting position. Repeat __________ times. Complete this exercise __________ times a day. Shoulder abduction, active-assisted You will need a stick, broom handle, or similar object to help you (assist) in doing this exercise. Lie on your back. This is the supine position. Hold a broomstick, a cane, or a similar object. Place your hands a little more than shoulder-width apart on the object. Your left / right hand should  be palm-up, and your other hand should be palm-down. Keeping your shoulder relaxed, push the stick to raise your left / right arm out to your side (abduction) and then over your head. Use your other hand to help move the stick. Stop when you feel a stretch in your shoulder, or when you reach the angle that is recommended by your health care provider. Avoid shrugging your  shoulder while you raise your arm. Keep your shoulder blade tucked down toward the middle of your back. Hold for __________ seconds. Slowly return to the starting position. Repeat __________ times. Complete this exercise __________ times a day. Shoulder flexion, active-assisted  Lie on your back. You may bend your knees for comfort. Hold a broomstick, a cane, or a similar object so that your hands are about shoulder-width apart. Your palms should face toward your feet. Raise your left / right arm over your head, then behind your head toward the floor (flexion). Use your other hand to help you do this (active-assisted). Stop when you feel a gentle stretch in your shoulder, or when you reach the angle that is recommended by your health care provider. Hold for __________ seconds. Use the stick and your other arm to help you return your left / right arm to the starting position. Repeat __________ times. Complete this exercise __________ times a day. External rotation  Sit in a stable chair without armrests, or stand up. Tuck a soft object, such as a folded towel or a small ball, under your left / right upper arm. Hold a broomstick, a cane, or a similar object with your palms face-down, toward the floor. Bend your elbows to a 90-degree angle (right angle), and keep your hands about shoulder-width apart. Straighten your healthy arm and push the stick across your body, toward your left / right side. Keep your left / right arm bent. This will rotate your left / right forearm away from your body (external rotation). Hold for __________ seconds. Slowly return to the starting position. Repeat __________ times. Complete this exercise __________ times a day. Strengthening exercises These exercises build strength and endurance in your shoulder. Endurance is the ability to use your muscles for a long time, even after they get tired. Do not start doing these exercises until your health care provider  approves. Shoulder flexion, isometric Stand or sit in a doorway, facing the door frame. Keep your left / right arm straight and make a gentle fist with your hand. Place your fist against the door frame. Only your fist should be touching the frame. Keep your upper arm at your side. Gently press your fist against the door frame, as if you are trying to raise your arm above your head (isometric shoulder flexion). Avoid shrugging your shoulder while you press your hand into the door frame. Keep your shoulder blade tucked down toward the middle of your back. Hold for __________ seconds. Slowly release the tension, and relax your muscles completely before you repeat the exercise. Repeat __________ times. Complete this exercise __________ times a day. Shoulder abduction, isometric  Stand or sit in a doorway. Your left / right arm should be closest to the door frame. Keep your left / right arm straight, and place the back of your hand against the door frame. Only your hand should be touching the frame. Keep the rest of your arm close to your side. Gently press the back of your hand against the door frame, as if you are trying to raise your arm out  to the side (isometric shoulder abduction). Avoid shrugging your shoulder while you press your hand into the door frame. Keep your shoulder blade tucked down toward the middle of your back. Hold for __________ seconds. Slowly release the tension, and relax your muscles completely before you repeat the exercise. Repeat __________ times. Complete this exercise __________ times a day. Internal rotation, isometric This is an exercise in which you press your palm against a door frame without moving your shoulder joint (isometric). Stand or sit in a doorway, facing the door frame. Bend your left / right elbow, and place the palm of your hand against the door frame. Only your palm should be touching the frame. Keep your upper arm at your side. Gently press your hand  against the door frame, as if you are trying to push your arm toward your abdomen (internal rotation). Gradually increase the pressure until you are pressing as hard as you can. Stop increasing the pressure if you feel shoulder pain. Avoid shrugging your shoulder while you press your hand into the door frame. Keep your shoulder blade tucked down toward the middle of your back. Hold for __________ seconds. Slowly release the tension, and relax your muscles completely before you repeat the exercise. Repeat __________ times. Complete this exercise __________ times a day. External rotation, isometric This is an exercise in which you press the back of your wrist against a door frame without moving your shoulder joint (isometric). Stand or sit in a doorway, facing the door frame. Bend your left / right elbow and place the back of your wrist against the door frame. Only the back of your wrist should be touching the frame. Keep your upper arm at your side. Gently press your wrist against the door frame, as if you are trying to push your arm away from your abdomen (external rotation). Gradually increase the pressure until you are pressing as hard as you can. Stop increasing the pressure if you feel pain. Avoid shrugging your shoulder while you press your wrist into the door frame. Keep your shoulder blade tucked down toward the middle of your back. Hold for __________ seconds. Slowly release the tension, and relax your muscles completely before you repeat the exercise. Repeat __________ times. Complete this exercise __________ times a day. This information is not intended to replace advice given to you by your health care provider. Make sure you discuss any questions you have with your health care provider. Document Revised: 11/22/2019 Document Reviewed: 11/22/2019 Elsevier Patient Education  [place].

## 2021-05-21 LAB — COMPLETE METABOLIC PANEL WITH GFR
AG Ratio: 1.5 (calc) (ref 1.0–2.5)
ALT: 19 U/L (ref 6–29)
AST: 17 U/L (ref 10–35)
Albumin: 4.1 g/dL (ref 3.6–5.1)
Alkaline phosphatase (APISO): 55 U/L (ref 37–153)
BUN: 16 mg/dL (ref 7–25)
CO2: 28 mmol/L (ref 20–32)
Calcium: 9.1 mg/dL (ref 8.6–10.4)
Chloride: 106 mmol/L (ref 98–110)
Creat: 0.69 mg/dL (ref 0.50–1.05)
Globulin: 2.7 g/dL (calc) (ref 1.9–3.7)
Glucose, Bld: 91 mg/dL (ref 65–99)
Potassium: 4.2 mmol/L (ref 3.5–5.3)
Sodium: 141 mmol/L (ref 135–146)
Total Bilirubin: 0.5 mg/dL (ref 0.2–1.2)
Total Protein: 6.8 g/dL (ref 6.1–8.1)
eGFR: 95 mL/min/{1.73_m2} (ref >=60)

## 2021-05-21 LAB — CBC WITH DIFFERENTIAL/PLATELET
Absolute Monocytes: 429 cells/uL (ref 200–950)
Basophils Absolute: 48 cells/uL (ref 0–200)
Basophils Relative: 0.9 %
Eosinophils Absolute: 80 cells/uL (ref 15–500)
Eosinophils Relative: 1.5 %
HCT: 40.2 % (ref 35.0–45.0)
Hemoglobin: 13.7 g/dL (ref 11.7–15.5)
Lymphs Abs: 1521 cells/uL (ref 850–3900)
MCH: 31.9 pg (ref 27.0–33.0)
MCHC: 34.1 g/dL (ref 32.0–36.0)
MCV: 93.5 fL (ref 80.0–100.0)
MPV: 9.6 fL (ref 7.5–12.5)
Monocytes Relative: 8.1 %
Neutro Abs: 3222 cells/uL (ref 1500–7800)
Neutrophils Relative %: 60.8 %
Platelets: 156 10*3/uL (ref 140–400)
RBC: 4.3 10*6/uL (ref 3.80–5.10)
RDW: 12.1 % (ref 11.0–15.0)
Total Lymphocyte: 28.7 %
WBC: 5.3 10*3/uL (ref 3.8–10.8)

## 2021-05-21 LAB — LIPID PANEL
Cholesterol: 198 mg/dL (ref <200)
HDL: 51 mg/dL (ref >=50)
LDL Cholesterol (Calc): 125 mg/dL (calc) — ABNORMAL HIGH
Non-HDL Cholesterol (Calc): 147 mg/dL (calc) — ABNORMAL HIGH (ref <130)
Total CHOL/HDL Ratio: 3.9 (calc) (ref <5.0)
Triglycerides: 113 mg/dL (ref <150)

## 2021-06-09 ENCOUNTER — Ambulatory Visit (INDEPENDENT_AMBULATORY_CARE_PROVIDER_SITE_OTHER): Payer: Medicare Other

## 2021-06-09 DIAGNOSIS — Z0000 Encounter for general adult medical examination without abnormal findings: Secondary | ICD-10-CM

## 2021-06-09 NOTE — Progress Notes (Signed)
Subjective:   Alexandria Hill is a 66 y.o. female who presents for an Initial Medicare Annual Wellness Visit.  Virtual Visit via Telephone Note  I connected with  Alexandria Hill on 06/09/21 at  8:00 AM EST by telephone and verified that I am speaking with the correct person using two identifiers.  Location: Patient: home Provider: [place] Persons participating in the virtual visit: [place]   I discussed the limitations, risks, security and privacy concerns of performing an evaluation and management service by telephone and the availability of in person appointments. The patient expressed understanding and agreed to proceed.  Interactive audio and video telecommunications were attempted between this nurse and patient, however failed, due to patient having technical difficulties OR patient did not have access to video capability.  We continued and completed visit with audio only.  Some vital signs may be absent or patient reported.   Clemetine Marker, LPN   Review of Systems     Cardiac Risk Factors include: advanced age (>11men, >71 women);hypertension     Objective:    There were no vitals filed for this visit. There is no height or weight on file to calculate BMI.  Advanced Directives 06/09/2021 03/17/2020 06/13/2018 03/31/2018 03/28/2018 10/08/2016 08/11/2016  Does Patient Have a Medical Advance Directive? Yes Yes Yes No No Yes Yes  Type of Paramedic of [place];Living will [place];Living will [place];Living will - - - -  Does patient want to make changes to medical advance directive? - - Yes (Inpatient - patient requests chaplain consult to change a medical advance directive) - - - -  Copy of [place] in Chart? No - copy requested Yes - validated most recent copy scanned in chart (See row information) Yes - validated most recent copy scanned in chart (See row information) - - - -   Would patient like information on creating a medical advance directive? - - - No - Patient declined No - Patient declined - -    Current Medications (verified) Outpatient Encounter Medications as of 06/09/2021  Medication Sig   alendronate (FOSAMAX) 70 MG tablet TAKE 1 TABLET(70 MG) BY MOUTH EVERY 7 DAYS WITH A FULL GLASS OF WATER AND ON AN EMPTY STOMACH   Ascorbic Acid (VITAMIN C) 500 MG CAPS Take by mouth daily.    Calcium Citrate-Vitamin D 200-250 MG-UNIT TABS Take by mouth daily.   lisinopril (ZESTRIL) 10 MG tablet Take 1 tablet (10 mg total) by mouth daily.   meloxicam (MOBIC) 15 MG tablet Take 1 tablet (15 mg total) by mouth daily.   Misc Natural Products (OSTEO BI-FLEX JOINT SHIELD) TABS Take by mouth daily.    Multiple Vitamin (MULTIVITAMIN) capsule Take 1 capsule by mouth daily.   No facility-administered encounter medications on file as of 06/09/2021.    Allergies (verified) No known allergies   History: Past Medical History:  Diagnosis Date   Arthritis    Cataract    GERD (gastroesophageal reflux disease)    Glaucoma 04/21/2021   Angle-closure glaucoma, recently had laser procedure on both eyes to correct.   Hyperlipidemia    Hypertension    IBS (irritable bowel syndrome) 2019   Osteopenia    Osteoporosis    Overweight (BMI 25.0-29.9) 08/11/2016   Reflux    Renal calculi 07/2017   Past Surgical History:  Procedure Laterality Date   CESAREAN SECTION  06/07/1985   COLONOSCOPY WITH PROPOFOL N/A 12/05/2014  Procedure: COLONOSCOPY WITH PROPOFOL;  Surgeon: Lucilla Lame, MD;  Location: [facility] ENDOSCOPY;  Service: Endoscopy;  Laterality: N/A;   COLONOSCOPY WITH PROPOFOL N/A 03/17/2020   Procedure: COLONOSCOPY WITH PROPOFOL;  Surgeon: Lucilla Lame, MD;  Location: [place];  Service: Endoscopy;  Laterality: N/A;   EXTRACORPOREAL SHOCK WAVE LITHOTRIPSY Left 07/14/2017   Procedure: EXTRACORPOREAL SHOCK WAVE LITHOTRIPSY (ESWL);  Surgeon: Abbie Sons, MD;   Location: [facility] ORS;  Service: Urology;  Laterality: Left;   Family History  Problem Relation Age of Onset   Aneurysm Mother        brain   Hypertension Mother    Stroke Mother        possibly mini strokes   Arthritis Mother    Heart disease Father    Heart attack Father    Mental retardation Father    Kidney disease Father    Cancer Brother    Diabetes Maternal Aunt    Hypertension Sister    Obesity Sister    Mental illness Sister    Cancer Maternal Grandmother    Hypertension Sister    81 / Stillbirths Sister    Obesity Sister    Mental illness Sister    Depression Sister    Obesity Sister    Depression Sister    Hypertension Sister    Obesity Sister    Hypertension Sister    Depression Sister    ADD / ADHD Sister    Cancer Sister    COPD Neg Hx    Breast cancer Neg Hx    Social History   Socioeconomic History   Marital status: Divorced    Spouse name: Not on file   Number of children: 2   Years of education: 12   Highest education level: Not on file  Occupational History   Not on file  Tobacco Use   Smoking status: Former    Packs/day: 0.50    Years: 10.00    Pack years: 5.00    Types: Cigarettes    Quit date: 04/23/1985    Years since quitting: 36.1   Smokeless tobacco: Never  Vaping Use   Vaping Use: Never used  Substance and Sexual Activity   Alcohol use: Yes    Comment: occassionally   Drug use: Never   Sexual activity: Not Currently    Partners: Male    Birth control/protection: Post-menopausal  Other Topics Concern   Not on file  Social History Narrative   Not on file   Social Determinants of Health   Financial Resource Strain: Low Risk    Difficulty of Paying Living Expenses: Not hard at all  Food Insecurity: No Food Insecurity   Worried About Charity fundraiser in the Last Year: Never true   [place] in the Last Year: Never true  Transportation Needs: No Transportation Needs   Lack of Transportation (Medical): No    Lack of Transportation (Non-Medical): No  Physical Activity: Insufficiently Active   Days of Exercise per Week: 4 days   Minutes of Exercise per Session: 30 min  Stress: No Stress Concern Present   Feeling of Stress : Not at all  Social Connections: Moderately Integrated   Frequency of Communication with Friends and Family: More than three times a week   Frequency of Social Gatherings with Friends and Family: More than three times a week   Attends Religious Services: More than 4 times per year   Active Member of Clubs or Organizations: Yes  Attends Archivist Meetings: More than 4 times per year   Marital Status: Divorced    Tobacco Counseling Counseling given: Not Answered   Clinical Intake:                          Activities of Daily Living In your present state of health, do you have any difficulty performing the following activities: 06/09/2021 05/20/2021  Hearing? N N  Vision? N N  Difficulty concentrating or making decisions? N N  Walking or climbing stairs? N N  Dressing or bathing? N N  Doing errands, shopping? N N  Preparing Food and eating ? N -  Using the Toilet? N -  In the past six months, have you accidently leaked urine? N -  Do you have problems with loss of bowel control? N -  Managing your Medications? N -  Managing your Finances? N -  Housekeeping or managing your Housekeeping? N -  Some recent data might be hidden    Patient Care Team: Steele Sizer, MD as PCP - General (Family Medicine) Oneta Rack, MD (Dermatology) Lucilla Lame, MD as Consulting Physician (Gastroenterology)  Indicate any recent Medical Services you may have received from other than [facility] providers in the past year (date may be approximate).     Assessment:   This is a routine wellness examination for Alexandria Hill.  Hearing/Vision screen Hearing Screening - Comments:: Pt denies hearing difficulty Vision Screening - Comments:: Annual vision  screenings done at [place] issues and exercise activities discussed: Current Exercise Habits: Home exercise routine, Type of exercise: walking, Time (Minutes): 30, Frequency (Times/Week): 4, Weekly Exercise (Minutes/Week): 120, Intensity: Moderate, Exercise limited by: None identified   Goals Addressed             This Visit's Progress    Patient Stated       Maintain health and activity level        Depression Screen PHQ 2/9 Scores 06/09/2021 05/20/2021 11/18/2020 05/12/2020 07/12/2019 05/11/2019 03/29/2019  PHQ - 2 Score 0 0 0 0 0 0 0  PHQ- 9 Score - - - 0 0 0 0    Fall Risk Fall Risk  06/09/2021 05/20/2021 11/18/2020 05/12/2020 07/12/2019  Falls in the past year? 1 1 0 0 0  Number falls in past yr: 0 0 0 0 0  Injury with Fall? 0 0 0 0 0  Risk for fall due to : History of fall(s) History of fall(s) - - -  Follow up Falls prevention discussed Falls prevention discussed - - -    FALL RISK PREVENTION PERTAINING TO THE HOME:  Any stairs in or around the home? Yes  If so, are there any without handrails? No  Home free of loose throw rugs in walkways, pet beds, electrical cords, etc? Yes  Adequate lighting in your home to reduce risk of falls? Yes   ASSISTIVE DEVICES UTILIZED TO PREVENT FALLS:  Life alert? No  Use of a cane, walker or w/c? No  Grab bars in the bathroom? No  Shower chair or bench in shower? No  Elevated toilet seat or a handicapped toilet? No   TIMED UP AND GO:  Was the test performed? No . Telephonic visit.   Cognitive Function: Normal cognitive status assessed by direct observation by this Nurse Health Advisor. No abnormalities found.          Immunizations Immunization History  Administered Date(s) Administered  Fluad Quad(high Dose 65+) 03/06/2019, 03/04/2020, 03/10/2021   Influenza Inj Mdck Quad Pf 03/05/2018   Influenza,inj,Quad PF,6+ Mos 03/08/2017   Influenza-Unspecified 04/07/2015, 03/05/2018   PFIZER(Purple Top)SARS-COV-2  Vaccination 07/18/2019, 08/08/2019, 03/28/2020   Pneumococcal Conjugate-13 03/06/2019   Pneumococcal Polysaccharide-23 05/12/2020   Tdap 01/06/2012, 12/15/2013   Zoster Recombinat (Shingrix) 06/05/2019, 10/03/2019    TDAP status: Up to date  Flu Vaccine status: Up to date  Pneumococcal vaccine status: Up to date  Covid-19 vaccine status: Completed vaccines  Qualifies for Shingles Vaccine? Yes   Zostavax completed Yes   Shingrix Completed?: Yes  Screening Tests Health Maintenance  Topic Date Due   COVID-19 Vaccine (4 - Booster for Pfizer series) 05/23/2020   MAMMOGRAM  07/15/2021   DEXA SCAN  07/15/2022   TETANUS/TDAP  12/16/2023   COLONOSCOPY (Pts 45-34yrs Insurance coverage will need to be confirmed)  03/18/2027   Pneumonia Vaccine 1+ Years old  Completed   INFLUENZA VACCINE  Completed   Hepatitis C Screening  Completed   Zoster Vaccines- Shingrix  Completed   HPV VACCINES  Aged Out    Health Maintenance  Health Maintenance Due  Topic Date Due   COVID-19 Vaccine (4 - Booster for [place] series) 05/23/2020    Colorectal cancer screening: Type of screening: Colonoscopy. Completed 03/17/20. Repeat every 7 years  Mammogram status: Completed 07/15/20. Repeat every year  Bone Density status: Completed 07/15/20. Results reflect: Bone density results: OSTEOPENIA. Repeat every 2 years.  Lung Cancer Screening: (Low Dose CT Chest recommended if Age 15-80 years, 30 pack-year currently smoking OR have quit w/in 15years.) does not qualify.   Additional Screening:  Hepatitis C Screening: does qualify; Completed 05/11/19  Vision Screening: Recommended annual ophthalmology exams for early detection of glaucoma and other disorders of the eye. Is the patient up to date with their annual eye exam?  Yes  Who is the provider or what is the name of the office in which the patient attends annual eye exams? [facility].   Dental Screening: Recommended annual dental exams for  proper oral hygiene  Community Resource Referral / Chronic Care Management: CRR required this visit?  No   CCM required this visit?  No      Plan:     I have personally reviewed and noted the following in the patients chart:   Medical and social history Use of alcohol, tobacco or illicit drugs  Current medications and supplements including opioid prescriptions. Patient is not currently taking opioid prescriptions. Functional ability and status Nutritional status Physical activity Advanced directives List of other physicians Hospitalizations, surgeries, and ER visits in previous 12 months Vitals Screenings to include cognitive, depression, and falls Referrals and appointments  In addition, I have reviewed and discussed with patient certain preventive protocols, quality metrics, and best practice recommendations. A written personalized care plan for preventive services as well as general preventive health recommendations were provided to patient.     Clemetine Marker, LPN   1/0/2725   Nurse Notes: none

## 2021-06-09 NOTE — Patient Instructions (Signed)
[name] , Thank you for taking time to come for your Medicare Wellness Visit. I appreciate your ongoing commitment to your health goals. Please review the following plan we discussed and let me know if I can assist you in the future.   Screening recommendations/referrals: Colonoscopy: done 03/17/20; repeat 03/2027 Mammogram: done 07/15/20. Scheduled for 07/16/21 Bone Density: done 07/15/20 Recommended yearly ophthalmology/optometry visit for glaucoma screening and checkup Recommended yearly dental visit for hygiene and checkup  Vaccinations: Influenza vaccine: done 03/10/21 Pneumococcal vaccine: done 05/12/20 Tdap vaccine: done 12/15/13 Shingles vaccine: done 06/05/19 & 10/03/19   Covid-19:done 07/18/19, 08/08/19 & 03/28/20  Advanced directives: Please bring a copy of your health care power of attorney and living will to the office at your convenience.   Conditions/risks identified: Keep up the great work!  Next appointment: Follow up in one year for your annual wellness visit    Preventive Care 65 Years and Older, Female Preventive care refers to lifestyle choices and visits with your health care provider that can promote health and wellness. What does preventive care include? A yearly physical exam. This is also called an annual well check. Dental exams once or twice a year. Routine eye exams. Ask your health care provider how often you should have your eyes checked. Personal lifestyle choices, including: Daily care of your teeth and gums. Regular physical activity. Eating a healthy diet. Avoiding tobacco and drug use. Limiting alcohol use. Practicing safe sex. Taking low-dose aspirin every day. Taking vitamin and mineral supplements as recommended by your health care provider. What happens during an annual well check? The services and screenings done by your health care provider during your annual well check will depend on your age, overall health, lifestyle risk factors, and family  history of disease. Counseling  Your health care provider may ask you questions about your: Alcohol use. Tobacco use. Drug use. Emotional well-being. Home and relationship well-being. Sexual activity. Eating habits. History of falls. Memory and ability to understand (cognition). Work and work Statistician. Reproductive health. Screening  You may have the following tests or measurements: Height, weight, and BMI. Blood pressure. Lipid and cholesterol levels. These may be checked every 5 years, or more frequently if you are over 76 years old. Skin check. Lung cancer screening. You may have this screening every year starting at age 59 if you have a 30-pack-year history of smoking and currently smoke or have quit within the past 15 years. Fecal occult blood test (FOBT) of the stool. You may have this test every year starting at age 52. Flexible sigmoidoscopy or colonoscopy. You may have a sigmoidoscopy every 5 years or a colonoscopy every 10 years starting at age 65. Hepatitis C blood test. Hepatitis B blood test. Sexually transmitted disease (STD) testing. Diabetes screening. This is done by checking your blood sugar (glucose) after you have not eaten for a while (fasting). You may have this done every 1-3 years. Bone density scan. This is done to screen for osteoporosis. You may have this done starting at age 59. Mammogram. This may be done every 1-2 years. Talk to your health care provider about how often you should have regular mammograms. Talk with your health care provider about your test results, treatment options, and if necessary, the need for more tests. Vaccines  Your health care provider may recommend certain vaccines, such as: Influenza vaccine. This is recommended every year. Tetanus, diphtheria, and acellular pertussis (Tdap, Td) vaccine. You may need a Td booster every 10 years. Zoster vaccine.  You may need this after age 62. Pneumococcal 13-valent conjugate (PCV13)  vaccine. One dose is recommended after age 32. Pneumococcal polysaccharide (PPSV23) vaccine. One dose is recommended after age 71. Talk to your health care provider about which screenings and vaccines you need and how often you need them. This information is not intended to replace advice given to you by your health care provider. Make sure you discuss any questions you have with your health care provider. Document Released: 06/20/2015 Document Revised: 02/11/2016 Document Reviewed: 03/25/2015 Elsevier Interactive Patient Education  2017 [place] Prevention in the Home Falls can cause injuries. They can happen to people of all ages. There are many things you can do to make your home safe and to help prevent falls. What can I do on the outside of my home? Regularly fix the edges of walkways and driveways and fix any cracks. Remove anything that might make you trip as you walk through a door, such as a raised step or threshold. Trim any bushes or trees on the path to your home. Use bright outdoor lighting. Clear any walking paths of anything that might make someone trip, such as rocks or tools. Regularly check to see if handrails are loose or broken. Make sure that both sides of any steps have handrails. Any raised decks and porches should have guardrails on the edges. Have any leaves, snow, or ice cleared regularly. Use sand or salt on walking paths during winter. Clean up any spills in your garage right away. This includes oil or grease spills. What can I do in the bathroom? Use night lights. Install grab bars by the toilet and in the tub and shower. Do not use towel bars as grab bars. Use non-skid mats or decals in the tub or shower. If you need to sit down in the shower, use a plastic, non-slip stool. Keep the floor dry. Clean up any water that spills on the floor as soon as it happens. Remove soap buildup in the tub or shower regularly. Attach bath mats securely with  double-sided non-slip rug tape. Do not have throw rugs and other things on the floor that can make you trip. What can I do in the bedroom? Use night lights. Make sure that you have a light by your bed that is easy to reach. Do not use any sheets or blankets that are too big for your bed. They should not hang down onto the floor. Have a firm chair that has side arms. You can use this for support while you get dressed. Do not have throw rugs and other things on the floor that can make you trip. What can I do in the kitchen? Clean up any spills right away. Avoid walking on wet floors. Keep items that you use a lot in easy-to-reach places. If you need to reach something above you, use a strong step stool that has a grab bar. Keep electrical cords out of the way. Do not use floor polish or wax that makes floors slippery. If you must use wax, use non-skid floor wax. Do not have throw rugs and other things on the floor that can make you trip. What can I do with my stairs? Do not leave any items on the stairs. Make sure that there are handrails on both sides of the stairs and use them. Fix handrails that are broken or loose. Make sure that handrails are as long as the stairways. Check any carpeting to make sure that it is  firmly attached to the stairs. Fix any carpet that is loose or worn. Avoid having throw rugs at the top or bottom of the stairs. If you do have throw rugs, attach them to the floor with carpet tape. Make sure that you have a light switch at the top of the stairs and the bottom of the stairs. If you do not have them, ask someone to add them for you. What else can I do to help prevent falls? Wear shoes that: Do not have high heels. Have rubber bottoms. Are comfortable and fit you well. Are closed at the toe. Do not wear sandals. If you use a stepladder: Make sure that it is fully opened. Do not climb a closed stepladder. Make sure that both sides of the stepladder are locked  into place. Ask someone to hold it for you, if possible. Clearly mark and make sure that you can see: Any grab bars or handrails. First and last steps. Where the edge of each step is. Use tools that help you move around (mobility aids) if they are needed. These include: Canes. Walkers. Scooters. Crutches. Turn on the lights when you go into a dark area. Replace any light bulbs as soon as they burn out. Set up your furniture so you have a clear path. Avoid moving your furniture around. If any of your floors are uneven, fix them. If there are any pets around you, be aware of where they are. Review your medicines with your doctor. Some medicines can make you feel dizzy. This can increase your chance of falling. Ask your doctor what other things that you can do to help prevent falls. This information is not intended to replace advice given to you by your health care provider. Make sure you discuss any questions you have with your health care provider. Document Released: 03/20/2009 Document Revised: 10/30/2015 Document Reviewed: 06/28/2014 Elsevier Interactive Patient Education  2017 [employer].

## 2021-07-16 ENCOUNTER — Other Ambulatory Visit: Payer: Self-pay

## 2021-07-16 ENCOUNTER — Ambulatory Visit
Admission: RE | Admit: 2021-07-16 | Discharge: 2021-07-16 | Disposition: A | Discharge status: Home / Self Care | Payer: Medicare Other | Source: Ambulatory Visit | Attending: Family Medicine | Admitting: Family Medicine

## 2021-07-16 DIAGNOSIS — Z1231 Encounter for screening mammogram for malignant neoplasm of breast: Secondary | ICD-10-CM | POA: Insufficient documentation

## 2021-07-24 ENCOUNTER — Other Ambulatory Visit: Payer: Self-pay | Admitting: Family Medicine

## 2021-07-24 DIAGNOSIS — M810 Age-related osteoporosis without current pathological fracture: Secondary | ICD-10-CM

## 2021-08-15 ENCOUNTER — Other Ambulatory Visit: Payer: Self-pay | Admitting: Family Medicine

## 2021-08-15 DIAGNOSIS — I10 Essential (primary) hypertension: Secondary | ICD-10-CM

## 2021-10-16 ENCOUNTER — Other Ambulatory Visit: Payer: Self-pay | Admitting: Family Medicine

## 2021-10-16 DIAGNOSIS — M810 Age-related osteoporosis without current pathological fracture: Secondary | ICD-10-CM

## 2022-03-05 ENCOUNTER — Ambulatory Visit (INDEPENDENT_AMBULATORY_CARE_PROVIDER_SITE_OTHER): Payer: Medicare Other

## 2022-03-05 DIAGNOSIS — Z23 Encounter for immunization: Secondary | ICD-10-CM

## 2022-04-11 ENCOUNTER — Other Ambulatory Visit: Payer: Self-pay | Admitting: Family Medicine

## 2022-04-11 DIAGNOSIS — M810 Age-related osteoporosis without current pathological fracture: Secondary | ICD-10-CM

## 2022-04-12 ENCOUNTER — Other Ambulatory Visit: Payer: Self-pay

## 2022-04-12 DIAGNOSIS — M810 Age-related osteoporosis without current pathological fracture: Secondary | ICD-10-CM

## 2022-04-13 MED ORDER — ALENDRONATE SODIUM 70 MG PO TABS: ORAL_TABLET | ORAL | 1 refills | Status: DC

## 2022-05-20 NOTE — Progress Notes (Signed)
Name: Alexandria Hill   MRN: [id number]    DOB: Oct 22, 1955   Date:05/21/2022       Progress Note  Subjective  Chief Complaint  Follow Up  HPI  HTN: used to see Dr. Fletcher Anon, but doing well, bp is at goal , no dizziness,  chest pain or palpitation Taking Lisinopril , does not check bp on a regular basis at home.   Dyslipidemia/Atherosclerosis  she is still not ready to take statin therapy, she is not back on aspirin yet.   The 10-year ASCVD risk score (Arnett DK, et al., 2019) is: 11%   Values used to calculate the score:     Age: 66 years     Sex: Female     Is Non-Hispanic African American: No     Diabetic: No     Tobacco smoker: No     Systolic Blood Pressure: 696 mmHg     Is BP treated: Yes     HDL Cholesterol: 51 mg/dL     Total Cholesterol: 198 mg/dL   Osteoporosis: She took fosamax for  a few months from 07/2019 for 3 months, stopped and resumed in Dec 21  bone density showed improvement 07/2020. She is on high calcium diet and takes vitamin D otc , she is up to date with dental exams, we will check vitamin D today   Left shoulder pain: she had severe problems a couple of years ago, had PT, lately she has noticed pain returning but not as severe, she will resume PT exercises at home and let me know if she wants to go back to PT   Patient Active Problem List   Diagnosis Date Noted   Low back pain 05/11/2021   Age-related osteoporosis without current pathological fracture 11/18/2020   Atherosclerosis of aorta ([place]) 05/12/2020   History of colonic polyps    Recurrent UTI 01/30/2019   Lung nodule, solitary 05/09/2018   IBS (irritable bowel syndrome) 11/14/2017   Sciatic nerve pain, right 11/14/2017   Personal history of urinary calculi 11/14/2017   Essential hypertension, benign 08/11/2016   Overweight (BMI 25.0-29.9) 08/11/2016    Past Surgical History:  Procedure Laterality Date   CESAREAN SECTION  06/07/1985   COLONOSCOPY WITH PROPOFOL N/A 12/05/2014   Procedure:  COLONOSCOPY WITH PROPOFOL;  Surgeon: Lucilla Lame, MD;  Location: [facility];  Service: Endoscopy;  Laterality: N/A;   COLONOSCOPY WITH PROPOFOL N/A 03/17/2020   Procedure: COLONOSCOPY WITH PROPOFOL;  Surgeon: Lucilla Lame, MD;  Location: [facility];  Service: Endoscopy;  Laterality: N/A;   EXTRACORPOREAL SHOCK WAVE LITHOTRIPSY Left 07/14/2017   Procedure: EXTRACORPOREAL SHOCK WAVE LITHOTRIPSY (ESWL);  Surgeon: Abbie Sons, MD;  Location: [facility];  Service: Urology;  Laterality: Left;    Family History  Problem Relation Age of Onset   Aneurysm Mother        brain   Hypertension Mother    Stroke Mother        possibly mini strokes   Arthritis Mother    Heart disease Father    Heart attack Father    Mental retardation Father    Kidney disease Father    Cancer Brother    Diabetes Maternal Aunt    Hypertension Sister    Obesity Sister    Mental illness Sister    Cancer Maternal Grandmother    Hypertension Sister    Miscarriages / Stillbirths Sister    Obesity Sister    Mental illness Sister    Depression Sister  Obesity Sister    Depression Sister    Hypertension Sister    Obesity Sister    Hypertension Sister    Depression Sister    ADD / ADHD Sister    Cancer Sister    COPD Neg Hx    Breast cancer Neg Hx     Social History   Tobacco Use   Smoking status: Former    Packs/day: 0.50    Years: 10.00    Total pack years: 5.00    Types: Cigarettes    Quit date: 04/23/1985    Years since quitting: 37.1   Smokeless tobacco: Never  Substance Use Topics   Alcohol use: Yes    Comment: occassionally     Current Outpatient Medications:    alendronate (FOSAMAX) 70 MG tablet, TAKE 1 TABLET(70 MG) BY MOUTH EVERY 7 DAYS WITH A FULL GLASS OF WATER AND ON AN EMPTY STOMACH, Disp: 12 tablet, Rfl: 1   Ascorbic Acid (VITAMIN C) 500 MG CAPS, Take by mouth daily. , Disp: , Rfl:    Calcium Citrate-Vitamin D 200-250 MG-UNIT TABS, Take by mouth daily., Disp: , Rfl:     lisinopril (ZESTRIL) 10 MG tablet, TAKE 1 TABLET(10 MG) BY MOUTH DAILY, Disp: 90 tablet, Rfl: 0   Misc Natural Products (OSTEO BI-FLEX JOINT SHIELD) TABS, Take by mouth daily. , Disp: , Rfl:    Multiple Vitamin (MULTIVITAMIN) capsule, Take 1 capsule by mouth daily., Disp: , Rfl:   Allergies  Allergen Reactions   No Known Allergies     I personally reviewed active problem list, medication list, allergies, family history, social history, health maintenance with the patient/caregiver today.   ROS  Constitutional: Negative for fever or weight change.  Respiratory: Negative for cough and shortness of breath.   Cardiovascular: Negative for chest pain or palpitations.  Gastrointestinal: Negative for abdominal pain, no bowel changes.  Musculoskeletal: Negative for gait problem or joint swelling.  Skin: Negative for rash.  Neurological: Negative for dizziness or headache.  No other specific complaints in a complete review of systems (except as listed in HPI above).   Objective  Vitals:   05/21/22 0755  BP: 132/76  Pulse: 87  Resp: 16  SpO2: 100%  Weight: 151 lb (68.5 kg)  Height: '5\' 2"'$  (1.575 m)    Body mass index is 27.62 kg/m.  Physical Exam  Constitutional: Patient appears well-developed and well-nourished.  No distress.  HEENT: head atraumatic, normocephalic, pupils equal and reactive to light, neck supple Cardiovascular: Normal rate, regular rhythm and normal heart sounds.  No murmur heard. No BLE edema. Pulmonary/Chest: Effort normal and breath sounds normal. No respiratory distress. Abdominal: Soft.  There is no tenderness. Psychiatric: Patient has a normal mood and affect. behavior is normal. Judgment and thought content normal.    PHQ2/9:    05/21/2022    7:54 AM 06/09/2021    8:08 AM 05/20/2021    8:20 AM 11/18/2020    8:58 AM 05/12/2020   10:10 AM  Depression screen PHQ 2/9  Decreased Interest 0 0 0 0 0  Down, Depressed, Hopeless 0 0 0 0 0  PHQ - 2 Score 0  0 0 0 0  Altered sleeping 0    0  Tired, decreased energy 0    0  Change in appetite 0    0  Feeling bad or failure about yourself  0    0  Trouble concentrating 0    0  Moving slowly or fidgety/restless 0  0  Suicidal thoughts 0    0  PHQ-9 Score 0    0    phq 9 is negative   Fall Risk:    05/21/2022    7:54 AM 06/09/2021    8:11 AM 05/20/2021    8:20 AM 11/18/2020    8:58 AM 05/12/2020   10:09 AM  Fall Risk   Falls in the past year? 0 1 1 0 0  Number falls in past yr: 0 0 0 0 0  Injury with Fall? 0 0 0 0 0  Risk for fall due to : No Fall Risks History of fall(s) History of fall(s)    Follow up Falls prevention discussed Falls prevention discussed Falls prevention discussed        Functional Status Survey: Is the patient deaf or have difficulty hearing?: No Does the patient have difficulty seeing, even when wearing glasses/contacts?: No Does the patient have difficulty concentrating, remembering, or making decisions?: No Does the patient have difficulty walking or climbing stairs?: No Does the patient have difficulty dressing or bathing?: No Does the patient have difficulty doing errands alone such as visiting a doctor's office or shopping?: No    Assessment & Plan  1. Essential hypertension, benign  - COMPLETE METABOLIC PANEL WITH GFR - CBC with Differential/Platelet  2. Atherosclerosis of aorta ([facility])  - Lipid panel  3. Age-related osteoporosis without current pathological fracture  - VITAMIN D 25 Hydroxy (Vit-D Deficiency, Fractures) - DG Bone Density; Future  4. Breast cancer screening by mammogram  - MM 3D SCREEN BREAST BILATERAL; Future  5. Dyslipidemia  - Lipid panel

## 2022-05-21 ENCOUNTER — Ambulatory Visit (INDEPENDENT_AMBULATORY_CARE_PROVIDER_SITE_OTHER): Payer: Medicare Other | Admitting: Family Medicine

## 2022-05-21 ENCOUNTER — Encounter: Payer: Self-pay | Admitting: Family Medicine

## 2022-05-21 VITALS — BP 132/76 | HR 87 | Resp 16 | Ht 62.0 in | Wt 151.0 lb

## 2022-05-21 DIAGNOSIS — M810 Age-related osteoporosis without current pathological fracture: Secondary | ICD-10-CM | POA: Diagnosis not present

## 2022-05-21 DIAGNOSIS — Z1231 Encounter for screening mammogram for malignant neoplasm of breast: Secondary | ICD-10-CM | POA: Diagnosis not present

## 2022-05-21 DIAGNOSIS — I10 Essential (primary) hypertension: Secondary | ICD-10-CM

## 2022-05-21 DIAGNOSIS — E785 Hyperlipidemia, unspecified: Secondary | ICD-10-CM

## 2022-05-21 DIAGNOSIS — I700 Atherosclerosis of aorta: Secondary | ICD-10-CM

## 2022-05-22 LAB — CBC WITH DIFFERENTIAL/PLATELET
Absolute Monocytes: 382 cells/uL (ref 200–950)
Basophils Absolute: 32 cells/uL (ref 0–200)
Basophils Relative: 0.7 %
Eosinophils Absolute: 60 cells/uL (ref 15–500)
Eosinophils Relative: 1.3 %
HCT: 39.7 % (ref 35.0–45.0)
Hemoglobin: 13.6 g/dL (ref 11.7–15.5)
Lymphs Abs: 1320 cells/uL (ref 850–3900)
MCH: 31.8 pg (ref 27.0–33.0)
MCHC: 34.3 g/dL (ref 32.0–36.0)
MCV: 92.8 fL (ref 80.0–100.0)
MPV: 9.4 fL (ref 7.5–12.5)
Monocytes Relative: 8.3 %
Neutro Abs: 2806 cells/uL (ref 1500–7800)
Neutrophils Relative %: 61 %
Platelets: 224 10*3/uL (ref 140–400)
RBC: 4.28 10*6/uL (ref 3.80–5.10)
RDW: 12.5 % (ref 11.0–15.0)
Total Lymphocyte: 28.7 %
WBC: 4.6 10*3/uL (ref 3.8–10.8)

## 2022-05-22 LAB — COMPLETE METABOLIC PANEL WITH GFR
AG Ratio: 1.6 (calc) (ref 1.0–2.5)
ALT: 15 U/L (ref 6–29)
AST: 16 U/L (ref 10–35)
Albumin: 4.4 g/dL (ref 3.6–5.1)
Alkaline phosphatase (APISO): 54 U/L (ref 37–153)
BUN: 17 mg/dL (ref 7–25)
CO2: 29 mmol/L (ref 20–32)
Calcium: 9.5 mg/dL (ref 8.6–10.4)
Chloride: 104 mmol/L (ref 98–110)
Creat: 0.87 mg/dL (ref 0.50–1.05)
Globulin: 2.8 g/dL (calc) (ref 1.9–3.7)
Glucose, Bld: 88 mg/dL (ref 65–99)
Potassium: 4.4 mmol/L (ref 3.5–5.3)
Sodium: 140 mmol/L (ref 135–146)
Total Bilirubin: 0.6 mg/dL (ref 0.2–1.2)
Total Protein: 7.2 g/dL (ref 6.1–8.1)
eGFR: 73 mL/min/{1.73_m2} (ref >=60)

## 2022-05-22 LAB — VITAMIN D 25 HYDROXY (VIT D DEFICIENCY, FRACTURES): Vit D, 25-Hydroxy: 48 ng/mL (ref 30–100)

## 2022-05-22 LAB — LIPID PANEL
Cholesterol: 191 mg/dL (ref <200)
HDL: 54 mg/dL (ref >=50)
LDL Cholesterol (Calc): 115 mg/dL (calc) — ABNORMAL HIGH
Non-HDL Cholesterol (Calc): 137 mg/dL (calc) — ABNORMAL HIGH (ref <130)
Total CHOL/HDL Ratio: 3.5 (calc) (ref <5.0)
Triglycerides: 117 mg/dL (ref <150)

## 2022-05-31 ENCOUNTER — Other Ambulatory Visit: Payer: Self-pay | Admitting: Family Medicine

## 2022-05-31 DIAGNOSIS — I10 Essential (primary) hypertension: Secondary | ICD-10-CM

## 2022-06-10 ENCOUNTER — Ambulatory Visit: Payer: Medicare Other

## 2022-06-16 ENCOUNTER — Telehealth (INDEPENDENT_AMBULATORY_CARE_PROVIDER_SITE_OTHER): Payer: Medicare Other | Admitting: Physician Assistant

## 2022-06-16 DIAGNOSIS — Z0000 Encounter for general adult medical examination without abnormal findings: Secondary | ICD-10-CM | POA: Diagnosis not present

## 2022-06-16 NOTE — Patient Instructions (Addendum)
[name] ,  Thank you for taking time to come for your Medicare Wellness Visit. I appreciate your ongoing commitment to your health goals. Please review the following plan we discussed and let me know if I can assist you in the future.   Screening recommendations/referrals: Colonoscopy: Completed 2021- repeat every 7 years  Mammogram: Scheduled in Feb 2024 Bone Density: Scheduled in Feb 2024 Recommended yearly ophthalmology/optometry visit for glaucoma screening and checkup Recommended yearly dental visit for hygiene and checkup  Vaccinations: Influenza vaccine: Completed for 2023 flu season  Pneumococcal vaccine: Completed  Tdap vaccine: Up to date, Next due in 2025 Shingles vaccine: Completed    Covid-19:Please stay up to date on the recommendations for your age group  Advanced directives: Completed but not on file. Please provide the office with a copy at your earliest convenience   Conditions/risks identified: None  Next appointment: Follow up in one year for your annual wellness visit    Preventive Care 65 Years and Older, Female Preventive care refers to lifestyle choices and visits with your health care provider that can promote health and wellness. What does preventive care include? A yearly physical exam. This is also called an annual well check. Dental exams once or twice a year. Routine eye exams. Ask your health care provider how often you should have your eyes checked. Personal lifestyle choices, including: Daily care of your teeth and gums. Regular physical activity. Eating a healthy diet. Avoiding tobacco and drug use. Limiting alcohol use. Practicing safe sex. Taking low-dose aspirin every day. Taking vitamin and mineral supplements as recommended by your health care provider. What happens during an annual well check? The services and screenings done by your health care provider during your annual well check will depend on your age, overall health,  lifestyle risk factors, and family history of disease. Counseling  Your health care provider may ask you questions about your: Alcohol use. Tobacco use. Drug use. Emotional well-being. Home and relationship well-being. Sexual activity. Eating habits. History of falls. Memory and ability to understand (cognition). Work and work Statistician. Reproductive health. Screening  You may have the following tests or measurements: Height, weight, and BMI. Blood pressure. Lipid and cholesterol levels. These may be checked every 5 years, or more frequently if you are over 47 years old. Skin check. Lung cancer screening. You may have this screening every year starting at age 73 if you have a 30-pack-year history of smoking and currently smoke or have quit within the past 15 years. Fecal occult blood test (FOBT) of the stool. You may have this test every year starting at age 21. Flexible sigmoidoscopy or colonoscopy. You may have a sigmoidoscopy every 5 years or a colonoscopy every 10 years starting at age 39. Hepatitis C blood test. Hepatitis B blood test. Sexually transmitted disease (STD) testing. Diabetes screening. This is done by checking your blood sugar (glucose) after you have not eaten for a while (fasting). You may have this done every 1-3 years. Bone density scan. This is done to screen for osteoporosis. You may have this done starting at age 82. Mammogram. This may be done every 1-2 years. Talk to your health care provider about how often you should have regular mammograms. Talk with your health care provider about your test results, treatment options, and if necessary, the need for more tests. Vaccines  Your health care provider may recommend certain vaccines, such as: Influenza vaccine. This is recommended every year. Tetanus, diphtheria, and acellular pertussis (Tdap, Td) vaccine.  You may need a Td booster every 10 years. Zoster vaccine. You may need this after age  9. Pneumococcal 13-valent conjugate (PCV13) vaccine. One dose is recommended after age 39. Pneumococcal polysaccharide (PPSV23) vaccine. One dose is recommended after age 40. Talk to your health care provider about which screenings and vaccines you need and how often you need them. This information is not intended to replace advice given to you by your health care provider. Make sure you discuss any questions you have with your health care provider. Document Released: 06/20/2015 Document Revised: 02/11/2016 Document Reviewed: 03/25/2015 Elsevier Interactive Patient Education  2017 [place] Prevention in the Home Falls can cause injuries. They can happen to people of all ages. There are many things you can do to make your home safe and to help prevent falls. What can I do on the outside of my home? Regularly fix the edges of walkways and driveways and fix any cracks. Remove anything that might make you trip as you walk through a door, such as a raised step or threshold. Trim any bushes or trees on the path to your home. Use bright outdoor lighting. Clear any walking paths of anything that might make someone trip, such as rocks or tools. Regularly check to see if handrails are loose or broken. Make sure that both sides of any steps have handrails. Any raised decks and porches should have guardrails on the edges. Have any leaves, snow, or ice cleared regularly. Use sand or salt on walking paths during winter. Clean up any spills in your garage right away. This includes oil or grease spills. What can I do in the bathroom? Use night lights. Install grab bars by the toilet and in the tub and shower. Do not use towel bars as grab bars. Use non-skid mats or decals in the tub or shower. If you need to sit down in the shower, use a plastic, non-slip stool. Keep the floor dry. Clean up any water that spills on the floor as soon as it happens. Remove soap buildup in the tub or shower  regularly. Attach bath mats securely with double-sided non-slip rug tape. Do not have throw rugs and other things on the floor that can make you trip. What can I do in the bedroom? Use night lights. Make sure that you have a light by your bed that is easy to reach. Do not use any sheets or blankets that are too big for your bed. They should not hang down onto the floor. Have a firm chair that has side arms. You can use this for support while you get dressed. Do not have throw rugs and other things on the floor that can make you trip. What can I do in the kitchen? Clean up any spills right away. Avoid walking on wet floors. Keep items that you use a lot in easy-to-reach places. If you need to reach something above you, use a strong step stool that has a grab bar. Keep electrical cords out of the way. Do not use floor polish or wax that makes floors slippery. If you must use wax, use non-skid floor wax. Do not have throw rugs and other things on the floor that can make you trip. What can I do with my stairs? Do not leave any items on the stairs. Make sure that there are handrails on both sides of the stairs and use them. Fix handrails that are broken or loose. Make sure that handrails are as long as  the stairways. Check any carpeting to make sure that it is firmly attached to the stairs. Fix any carpet that is loose or worn. Avoid having throw rugs at the top or bottom of the stairs. If you do have throw rugs, attach them to the floor with carpet tape. Make sure that you have a light switch at the top of the stairs and the bottom of the stairs. If you do not have them, ask someone to add them for you. What else can I do to help prevent falls? Wear shoes that: Do not have high heels. Have rubber bottoms. Are comfortable and fit you well. Are closed at the toe. Do not wear sandals. If you use a stepladder: Make sure that it is fully opened. Do not climb a closed stepladder. Make sure that  both sides of the stepladder are locked into place. Ask someone to hold it for you, if possible. Clearly mark and make sure that you can see: Any grab bars or handrails. First and last steps. Where the edge of each step is. Use tools that help you move around (mobility aids) if they are needed. These include: Canes. Walkers. Scooters. Crutches. Turn on the lights when you go into a dark area. Replace any light bulbs as soon as they burn out. Set up your furniture so you have a clear path. Avoid moving your furniture around. If any of your floors are uneven, fix them. If there are any pets around you, be aware of where they are. Review your medicines with your doctor. Some medicines can make you feel dizzy. This can increase your chance of falling. Ask your doctor what other things that you can do to help prevent falls. This information is not intended to replace advice given to you by your health care provider. Make sure you discuss any questions you have with your health care provider. Document Released: 03/20/2009 Document Revised: 10/30/2015 Document Reviewed: 06/28/2014 Elsevier Interactive Patient Education  2017 [employer].

## 2022-06-16 NOTE — Progress Notes (Signed)
Annual Wellness Visit   Virtual Visit via Video Note  I connected with Alexandria Hill on 06/16/22 at 10:00 AM EST by a video enabled telemedicine application and verified that I am speaking with the correct person using two identifiers.  Location: Patient: At home  Provider: [place], [place]    I discussed the limitations of evaluation and management by telemedicine and the availability of in person appointments. The patient expressed understanding and agreed to proceed.  Today's Provider: Talitha Givens, MHS, PA-C Introduced myself to the patient as a PA-C and provided education on APPs in clinical practice.     Subjective:   Alexandria Hill is a 67 y.o. female who presents for Medicare Annual (Subsequent) preventive examination.  Review of Systems:    Objective:     Vitals: There were no vitals taken for this visit.  There is no height or weight on file to calculate BMI.     06/09/2021    8:08 AM 03/17/2020    7:36 AM 06/13/2018    4:48 PM 03/31/2018    5:27 PM 03/28/2018    8:08 PM 10/08/2016    9:50 AM 08/11/2016    1:21 PM  Advanced Directives  Does Patient Have a Medical Advance Directive? Yes Yes Yes No No Yes Yes  Type of Paramedic of [place];Living will [place];Living will [place];Living will      Does patient want to make changes to medical advance directive?   Yes (Inpatient - patient requests chaplain consult to change a medical advance directive)      Copy of [place] in Chart? No - copy requested Yes - validated most recent copy scanned in chart (See row information) Yes - validated most recent copy scanned in chart (See row information)      Would patient like information on creating a medical advance directive?    No - Patient declined No - Patient declined      Tobacco Social History   Tobacco Use  Smoking Status Former   Packs/day:  0.50   Years: 10.00   Total pack years: 5.00   Types: Cigarettes   Quit date: 04/23/1985   Years since quitting: 37.1  Smokeless Tobacco Never     Counseling given: Not Answered   Clinical Intake:  Pre-visit preparation completed: Yes  Pain : No/denies pain     Nutritional Status: BMI 25 -29 Overweight Nutritional Risks: None Diabetes: No  How often do you need to have someone help you when you read instructions, pamphlets, or other written materials from your doctor or pharmacy?: 1 - Never What is the last grade level you completed in school?: 12th grade  Interpreter Needed?: No     Past Medical History:  Diagnosis Date   Arthritis    Cataract    GERD (gastroesophageal reflux disease)    Glaucoma 04/21/2021   Angle-closure glaucoma, recently had laser procedure on both eyes to correct.   Hyperlipidemia    Hypertension    IBS (irritable bowel syndrome) 2019   Osteopenia    Osteoporosis    Overweight (BMI 25.0-29.9) 08/11/2016   Reflux    Renal calculi 07/2017   Past Surgical History:  Procedure Laterality Date   CESAREAN SECTION  06/07/1985   COLONOSCOPY WITH PROPOFOL N/A 12/05/2014   Procedure: COLONOSCOPY WITH PROPOFOL;  Surgeon: Lucilla Lame, MD;  Location: [facility];  Service: Endoscopy;  Laterality: N/A;   COLONOSCOPY WITH PROPOFOL N/A 03/17/2020   Procedure: COLONOSCOPY WITH PROPOFOL;  Surgeon: Lucilla Lame, MD;  Location: [place];  Service: Endoscopy;  Laterality: N/A;   EXTRACORPOREAL SHOCK WAVE LITHOTRIPSY Left 07/14/2017   Procedure: EXTRACORPOREAL SHOCK WAVE LITHOTRIPSY (ESWL);  Surgeon: Abbie Sons, MD;  Location: [facility];  Service: Urology;  Laterality: Left;   Family History  Problem Relation Age of Onset   Aneurysm Mother        brain   Hypertension Mother    Stroke Mother        possibly mini strokes   Arthritis Mother    Heart disease Father    Heart attack Father    Mental retardation Father    Kidney disease  Father    Cancer Brother    Diabetes Maternal Aunt    Hypertension Sister    Obesity Sister    Mental illness Sister    Cancer Maternal Grandmother    Hypertension Sister    50 / Stillbirths Sister    Obesity Sister    Mental illness Sister    Depression Sister    Obesity Sister    Depression Sister    Hypertension Sister    Obesity Sister    Hypertension Sister    Depression Sister    ADD / ADHD Sister    Cancer Sister    COPD Neg Hx    Breast cancer Neg Hx    Social History   Socioeconomic History   Marital status: Divorced    Spouse name: Not on file   Number of children: 2   Years of education: 12   Highest education level: Not on file  Occupational History   Not on file  Tobacco Use   Smoking status: Former    Packs/day: 0.50    Years: 10.00    Total pack years: 5.00    Types: Cigarettes    Quit date: 04/23/1985    Years since quitting: 37.1   Smokeless tobacco: Never  Vaping Use   Vaping Use: Never used  Substance and Sexual Activity   Alcohol use: Yes    Comment: occassionally   Drug use: Never   Sexual activity: Not Currently    Partners: Male    Birth control/protection: Post-menopausal  Other Topics Concern   Not on file  Social History Narrative   Not on file   Social Determinants of Health   Financial Resource Strain: Low Risk  (06/09/2021)   Overall Financial Resource Strain (CARDIA)    Difficulty of Paying Living Expenses: Not hard at all  Food Insecurity: No Food Insecurity (06/09/2021)   Hunger Vital Sign    Worried About Running Out of Food in the Last Year: Never true    Ran Out of Food in the Last Year: Never true  Transportation Needs: No Transportation Needs (06/09/2021)   PRAPARE - Hydrologist (Medical): No    Lack of Transportation (Non-Medical): No  Physical Activity: Insufficiently Active (06/09/2021)   Exercise Vital Sign    Days of Exercise per Week: 4 days    Minutes of Exercise per  Session: 30 min  Stress: No Stress Concern Present (06/09/2021)   [place]    Feeling of Stress : Not at all  Social Connections: Moderately Integrated (06/09/2021)   Social Connection and Isolation Panel [NHANES]    Frequency of Communication with Friends and Family: More than three  times a week    Frequency of Social Gatherings with Friends and Family: More than three times a week    Attends Religious Services: More than 4 times per year    Active Member of [employer] or Organizations: Yes    Attends Archivist Meetings: More than 4 times per year    Marital Status: Divorced    Outpatient Encounter Medications as of 06/16/2022  Medication Sig   alendronate (FOSAMAX) 70 MG tablet TAKE 1 TABLET(70 MG) BY MOUTH EVERY 7 DAYS WITH A FULL GLASS OF WATER AND ON AN EMPTY STOMACH   Ascorbic Acid (VITAMIN C) 500 MG CAPS Take by mouth daily.    Calcium Citrate-Vitamin D 200-250 MG-UNIT TABS Take by mouth daily.   lisinopril (ZESTRIL) 10 MG tablet TAKE 1 TABLET(10 MG) BY MOUTH DAILY   Misc Natural Products (OSTEO BI-FLEX JOINT SHIELD) TABS Take by mouth daily.    Multiple Vitamin (MULTIVITAMIN) capsule Take 1 capsule by mouth daily.   No facility-administered encounter medications on file as of 06/16/2022.    Activities of Daily Living    06/16/2022   10:32 AM 06/16/2022    9:17 AM  In your present state of health, do you have any difficulty performing the following activities:  Hearing? 0 1  Vision? 0 0  Difficulty concentrating or making decisions? 0 0  Walking or climbing stairs? 0 0  Dressing or bathing? 0 0  Doing errands, shopping? 0 0  Preparing Food and eating ? N   Using the Toilet? N   In the past six months, have you accidently leaked urine? N   Do you have problems with loss of bowel control? N   Managing your Medications? N   Managing your Finances? N   Housekeeping or managing your Housekeeping? N      Patient Care Team: Steele Sizer, MD as PCP - General (Family Medicine) Oneta Rack, MD (Dermatology) Lucilla Lame, MD as Consulting Physician (Gastroenterology)    Assessment:   This is a routine wellness examination for Alexandria Hill.  Exercise Activities and Dietary recommendations Type of exercise: walking, Time (Minutes): 30, Frequency (Times/Week): 4, Weekly Exercise (Minutes/Week): 120, Intensity: Mild   Goals Addressed   None     Fall Risk:    06/16/2022    9:17 AM 05/21/2022    7:54 AM 06/09/2021    8:11 AM 05/20/2021    8:20 AM 11/18/2020    8:58 AM  Fall Risk   Falls in the past year? 0 0 1 1 0  Number falls in past yr: 0 0 0 0 0  Injury with Fall? 0 0 0 0 0  Risk for fall due to : No Fall Risks No Fall Risks History of fall(s) History of fall(s)   Follow up Falls prevention discussed;Education provided;Falls evaluation completed Falls prevention discussed Falls prevention discussed Falls prevention discussed     FALL RISK PREVENTION PERTAINING TO THE HOME:  Any stairs in or around the home? Yes  If so, are there any without handrails? Yes   Home free of loose throw rugs in walkways, pet beds, electrical cords, etc? Yes  Adequate lighting in your home to reduce risk of falls? Yes   ASSISTIVE DEVICES UTILIZED TO PREVENT FALLS:  Life alert? No  Use of a cane, walker or w/c? No  Grab bars in the bathroom? No  Shower chair or bench in shower? No  Elevated toilet seat or a handicapped toilet? No   DME ORDERS:  DME order needed?  No   TIMED UP AND GO:  Was the test performed?  No video visit .  Length of time to ambulate 10 feet: 0 sec.      Depression Screen    06/16/2022    9:17 AM 05/21/2022    7:54 AM 06/09/2021    8:08 AM 05/20/2021    8:20 AM  PHQ 2/9 Scores  PHQ - 2 Score 0 0 0 0  PHQ- 9 Score 0 0       Cognitive Function        06/16/2022   10:34 AM  6CIT Screen  What Year? 0 points  What month? 0 points  What time? 0 points   Count back from 20 0 points  Months in reverse 0 points  Repeat phrase 0 points  Total Score 0 points    Immunization History  Administered Date(s) Administered   Fluad Quad(high Dose 65+) 03/06/2019, 03/04/2020, 03/10/2021, 03/05/2022   Influenza Inj Mdck Quad Pf 03/05/2018   Influenza,inj,Quad PF,6+ Mos 03/08/2017   Influenza-Unspecified 04/07/2015, 03/05/2018   PFIZER(Purple Top)SARS-COV-2 Vaccination 07/18/2019, 08/08/2019, 03/28/2020   Pneumococcal Conjugate-13 03/06/2019   Pneumococcal Polysaccharide-23 05/12/2020   Tdap 01/06/2012, 12/15/2013   Zoster Recombinat (Shingrix) 06/05/2019, 10/03/2019    Qualifies for Shingles Vaccine? Completed Zoster immunizations  Tdap: Up to date  Flu Vaccine: Up to date  Pneumococcal Vaccine: Up to date   Covid-19 Vaccine: Completed vaccines  Screening Tests Health Maintenance  Topic Date Due   COVID-19 Vaccine (4 - 2023-24 season) 07/02/2022 (Originally 02/05/2022)   DEXA SCAN  07/15/2022   MAMMOGRAM  07/16/2022   Medicare Annual Wellness (AWV)  06/17/2023   DTaP/Tdap/Td (3 - Td or Tdap) 12/16/2023   COLONOSCOPY (Pts 45-59yr Insurance coverage will need to be confirmed)  03/18/2027   Pneumonia Vaccine 69 Years old  Completed   INFLUENZA VACCINE  Completed   Hepatitis C Screening  Completed   Zoster Vaccines- Shingrix  Completed   HPV VACCINES  Aged Out    Cancer Screenings:  Colorectal Screening: Completed 03/17/2020. Repeat every 7 years  Mammogram: Scheduled 07/2022  Bone Density: Scheduled 07/2022  Lung Cancer Screening: (Low Dose CT Chest recommended if Age 69-80years, 30 pack-year currently smoking OR have quit w/in 15years.) does not qualify.     Additional Screening:  Hepatitis C Screening: does qualify; Completed 05/11/2019  Vision Screening: Recommended annual ophthalmology exams for early detection of glaucoma and other disorders of the eye. Is the patient up to date with their annual eye exam?  Yes  Who  is the provider or what is the name of the office in which the pt attends annual eye exams? [facility]   Dental Screening: Recommended annual dental exams for proper oral hygiene  Community Resource Referral:  CRR required this visit?  No       Plan:  I have personally reviewed and addressed the Medicare Annual Wellness questionnaire and have noted the following in the patient's chart:  A. Medical and social history B. Use of alcohol, tobacco or illicit drugs  C. Current medications and supplements D. Functional ability and status E.  Nutritional status F.  Physical activity G. Advance directives H. List of other physicians I.  Hospitalizations, surgeries, and ER visits in previous 12 months J.  [facility] as hearing and vision if needed, cognitive and depression L. Referrals and appointments   In addition, I have reviewed and discussed with patient certain preventive protocols, quality  metrics, and best practice recommendations. A written personalized care plan for preventive services as well as general preventive health recommendations were provided to patient.  Signed,   Talitha Givens, MHS, PA-C [facility]

## 2022-06-17 ENCOUNTER — Ambulatory Visit: Payer: Medicare Other

## 2022-07-18 ENCOUNTER — Other Ambulatory Visit: Payer: Self-pay | Admitting: Family Medicine

## 2022-07-18 DIAGNOSIS — M810 Age-related osteoporosis without current pathological fracture: Secondary | ICD-10-CM

## 2022-07-27 ENCOUNTER — Ambulatory Visit
Admission: RE | Admit: 2022-07-27 | Discharge: 2022-07-27 | Disposition: A | Discharge status: Home / Self Care | Payer: Medicare Other | Source: Ambulatory Visit | Attending: Family Medicine | Admitting: Family Medicine

## 2022-07-27 DIAGNOSIS — M810 Age-related osteoporosis without current pathological fracture: Secondary | ICD-10-CM | POA: Diagnosis present

## 2022-07-27 DIAGNOSIS — Z1231 Encounter for screening mammogram for malignant neoplasm of breast: Secondary | ICD-10-CM

## 2022-10-28 ENCOUNTER — Other Ambulatory Visit: Payer: Self-pay | Admitting: Family Medicine

## 2022-10-28 DIAGNOSIS — M810 Age-related osteoporosis without current pathological fracture: Secondary | ICD-10-CM

## 2023-03-08 ENCOUNTER — Ambulatory Visit (INDEPENDENT_AMBULATORY_CARE_PROVIDER_SITE_OTHER): Payer: Medicare Other

## 2023-03-08 DIAGNOSIS — Z23 Encounter for immunization: Secondary | ICD-10-CM

## 2023-05-12 NOTE — Progress Notes (Signed)
Name: Alexandria Hill   MRN: [id number]    DOB: 1956-03-20   Date:05/23/2023       Progress Note  Subjective  Chief Complaint  Chief Complaint  Patient presents with   Medical Management of Chronic Issues    HPI  Discussed the use of AI scribe software for clinical note transcription with the patient, who gave verbal consent to proceed.  History of Present Illness   The patient, with a history of osteoporosis, atherosclerosis of Aorta , hypertension, and kidney stones, presents for a one-year follow-up. She reports a new onset of intermittent left foot pain, localized to the ball of the foot and radiating to the middle toes, which has been present for a couple of months. The pain is described as a tingling sensation, exacerbated by walking and relieved by rest.   The patient also has a history of low back pain, which has improved significantly since she started walking regularly. She denies any recent issues with kidney stones.  Regarding her medication regimen, the patient is compliant with Lisinopril for hypertension, Alendronate for osteoporosis, and takes aspirin a couple of times a week. She also takes a multivitamin, Osteobiflex, and a calcium plus vitamin D supplement. She reports no side effects from these medications. She denies chest pain or orthostatic changes  The patient is physically active, mainly walking, and has been managing her foot pain without any over-the-counter or prescription medications. She has been up-to-date with her vaccinations, including flu and pneumonia, but has not received the RSV vaccine. She has also completed the shingles vaccine series.          The 10-year ASCVD risk score (Arnett DK, et al., 2019) is: 9.6%   Values used to calculate the score:     Age: 67 years     Sex: Female     Is Non-Hispanic African American: No     Diabetic: No     Tobacco smoker: No     Systolic Blood Pressure: 118 mmHg     Is BP treated: Yes     HDL Cholesterol: 54  mg/dL     Total Cholesterol: 191 mg/dL  Patient Active Problem List   Diagnosis Date Noted   Low back pain 05/11/2021   Age-related osteoporosis without current pathological fracture 11/18/2020   Atherosclerosis of aorta (HCC) 05/12/2020   History of colonic polyps    Recurrent UTI 01/30/2019   Lung nodule, solitary 05/09/2018   IBS (irritable bowel syndrome) 11/14/2017   Sciatic nerve pain, right 11/14/2017   Personal history of urinary calculi 11/14/2017   Essential hypertension, benign 08/11/2016   Overweight (BMI 25.0-29.9) 08/11/2016    Past Surgical History:  Procedure Laterality Date   CESAREAN SECTION  06/07/1985   COLONOSCOPY WITH PROPOFOL N/A 12/05/2014   Procedure: COLONOSCOPY WITH PROPOFOL;  Surgeon: Midge Minium, MD;  Location: [facility];  Service: Endoscopy;  Laterality: N/A;   COLONOSCOPY WITH PROPOFOL N/A 03/17/2020   Procedure: COLONOSCOPY WITH PROPOFOL;  Surgeon: Midge Minium, MD;  Location: [facility];  Service: Endoscopy;  Laterality: N/A;   EXTRACORPOREAL SHOCK WAVE LITHOTRIPSY Left 07/14/2017   Procedure: EXTRACORPOREAL SHOCK WAVE LITHOTRIPSY (ESWL);  Surgeon: Riki Altes, MD;  Location: [facility];  Service: Urology;  Laterality: Left;    Family History  Problem Relation Age of Onset   Aneurysm Mother        brain   Hypertension Mother    Stroke Mother        possibly mini strokes  Arthritis Mother    Heart disease Father    Heart attack Father    Mental retardation Father    Kidney disease Father    Cancer Brother    Diabetes Maternal Aunt    Hypertension Sister    Obesity Sister    Mental illness Sister    Cancer Maternal Grandmother    Hypertension Sister    Miscarriages / Stillbirths Sister    Obesity Sister    Mental illness Sister    Depression Sister    Obesity Sister    Depression Sister    Hypertension Sister    Obesity Sister    Hypertension Sister    Depression Sister    ADD / ADHD Sister    Cancer Sister     COPD Neg Hx    Breast cancer Neg Hx     Social History   Tobacco Use   Smoking status: Former    Current packs/day: 0.00    Average packs/day: 0.5 packs/day for 10.0 years (5.0 ttl pk-yrs)    Types: Cigarettes    Start date: 04/24/1975    Quit date: 04/23/1985    Years since quitting: 38.1   Smokeless tobacco: Never  Substance Use Topics   Alcohol use: Yes    Comment: occassionally     Current Outpatient Medications:    alendronate (FOSAMAX) 70 MG tablet, TAKE 1 TABLET(70 MG) BY MOUTH EVERY 7 DAYS WITH A FULL GLASS OF WATER AND ON AN EMPTY STOMACH, Disp: 12 tablet, Rfl: 1   Ascorbic Acid (VITAMIN C) 500 MG CAPS, Take by mouth daily. , Disp: , Rfl:    aspirin EC 81 MG tablet, Take 81 mg by mouth daily. Swallow whole., Disp: , Rfl:    Calcium Citrate-Vitamin D 200-250 MG-UNIT TABS, Take by mouth daily., Disp: , Rfl:    lisinopril (ZESTRIL) 10 MG tablet, TAKE 1 TABLET(10 MG) BY MOUTH DAILY, Disp: 90 tablet, Rfl: 3   Misc Natural Products (OSTEO BI-FLEX JOINT SHIELD) TABS, Take by mouth daily. , Disp: , Rfl:    Multiple Vitamin (MULTIVITAMIN) capsule, Take 1 capsule by mouth daily., Disp: , Rfl:   Allergies  Allergen Reactions   No Known Allergies     I personally reviewed active problem list, medication list, allergies, family history with the patient/caregiver today.   ROS\ Ten systems reviewed and is negative except as mentioned in HPI    Objective  Vitals:   05/23/23 0806  BP: 118/74  Pulse: 74  Resp: 16  Temp: (!) 97.5 F (36.4 C)  TempSrc: Oral  SpO2: 99%  Weight: 156 lb (70.8 kg)  Height: 5\' 2"  (1.575 m)    Body mass index is 28.53 kg/m.  Physical Exam  Constitutional: Patient appears well-developed and well-nourished.  No distress.  HEENT: head atraumatic, normocephalic, pupils equal and reactive to light, neck supple Cardiovascular: Normal rate, regular rhythm and normal heart sounds.  No murmur heard. No BLE edema. Pulmonary/Chest: Effort normal  and breath sounds normal. No respiratory distress. Abdominal: Soft.  There is no tenderness. Psychiatric: Patient has a normal mood and affect. behavior is normal. Judgment and thought content normal.   PHQ2/9:    05/23/2023    8:05 AM 06/16/2022    9:17 AM 05/21/2022    7:54 AM 06/09/2021    8:08 AM 05/20/2021    8:20 AM  Depression screen PHQ 2/9  Decreased Interest 0 0 0 0 0  Down, Depressed, Hopeless 0 0 0 0 0  PHQ -  2 Score 0 0 0 0 0  Altered sleeping  0 0    Tired, decreased energy  0 0    Change in appetite  0 0    Feeling bad or failure about yourself   0 0    Trouble concentrating  0 0    Moving slowly or fidgety/restless  0 0    Suicidal thoughts  0 0    PHQ-9 Score  0 0    Difficult doing work/chores  Not difficult at all       phq 9 is negative   Fall Risk:    05/23/2023    8:01 AM 06/16/2022    9:17 AM 05/21/2022    7:54 AM 06/09/2021    8:11 AM 05/20/2021    8:20 AM  Fall Risk   Falls in the past year? 1 0 0 1 1  Number falls in past yr: 0 0 0 0 0  Injury with Fall? 1 0 0 0 0  Risk for fall due to : Impaired balance/gait No Fall Risks No Fall Risks History of fall(s) History of fall(s)  Follow up Falls prevention discussed;Education provided;Falls evaluation completed Falls prevention discussed;Education provided;Falls evaluation completed Falls prevention discussed Falls prevention discussed Falls prevention discussed     Assessment & Plan  Assessment and Plan    Left Foot Pain New onset, intermittent, located in the ball of the foot with radiation to the middle toes. Possible Morton's neuroma. Pain is activity-related and resolves with rest. -Consider consultation with a podiatrist if pain becomes constant or severe. -Avoid high heels and apply ice for pain relief.  Hyperlipidemia Patient is currently managing with aspirin a few times a week. Discussed the benefits of statin therapy given the presence of atherosclerotic plaque in the aorta, but  patient prefers to continue current management. -Continue aspirin as needed. -Consider statin therapy if patient changes mind.  Osteoporosis Patient is on Alendronate and has no history of fractures. -Continue Alendronate as prescribed. -Plan for bone density test in 2026.  Hypertension Controlled with Lisinopril. -Continue Lisinopril as prescribed.  General Health Maintenance -Continue Vitamin D supplement. -Order mammogram. -Check CBC, comprehensive panel, and lipid panel. -Consider physical activity such as biking for low impact exercise. -Consider RSV vaccine at the pharmacy. -Continue annual follow-up visits.

## 2023-05-23 ENCOUNTER — Telehealth: Payer: Self-pay

## 2023-05-23 ENCOUNTER — Ambulatory Visit (INDEPENDENT_AMBULATORY_CARE_PROVIDER_SITE_OTHER): Payer: Medicare Other | Admitting: Family Medicine

## 2023-05-23 ENCOUNTER — Other Ambulatory Visit: Payer: Self-pay | Admitting: Family Medicine

## 2023-05-23 ENCOUNTER — Encounter: Payer: Self-pay | Admitting: Family Medicine

## 2023-05-23 VITALS — BP 118/74 | HR 74 | Temp 97.5°F | Resp 16 | Ht 62.0 in | Wt 156.0 lb

## 2023-05-23 DIAGNOSIS — M79672 Pain in left foot: Secondary | ICD-10-CM | POA: Diagnosis not present

## 2023-05-23 DIAGNOSIS — I10 Essential (primary) hypertension: Secondary | ICD-10-CM

## 2023-05-23 DIAGNOSIS — I700 Atherosclerosis of aorta: Secondary | ICD-10-CM

## 2023-05-23 DIAGNOSIS — M810 Age-related osteoporosis without current pathological fracture: Secondary | ICD-10-CM | POA: Diagnosis not present

## 2023-05-23 DIAGNOSIS — Z1231 Encounter for screening mammogram for malignant neoplasm of breast: Secondary | ICD-10-CM

## 2023-05-23 DIAGNOSIS — E785 Hyperlipidemia, unspecified: Secondary | ICD-10-CM

## 2023-05-23 MED ORDER — LISINOPRIL 10 MG PO TABS: 10.0000 mg | ORAL_TABLET | Freq: Every day | ORAL | 3 refills | Status: DC

## 2023-05-23 MED ORDER — ALENDRONATE SODIUM 70 MG PO TABS: ORAL_TABLET | ORAL | 3 refills | Status: DC

## 2023-05-23 NOTE — Telephone Encounter (Signed)
Pt needs to schedule a AWV

## 2023-05-23 NOTE — Patient Instructions (Signed)
Morton Neuralgia  Morton neuralgia is foot pain that affects the ball of the foot and the area near the toes. Morton neuralgia occurs when part of a nerve in the foot (digital nerve) is under too much pressure (compressed). When this happens over a long period of time, the nerve can thicken (neuroma) and cause pain. Pain usually occurs between the third and fourth toes.  Morton neuralgia can come and go but may get worse over time. What are the causes? This condition is caused by doing the same things over and over with your foot, such as: Activities such as running or jumping. Wearing shoes that are too tight. What increases the risk? You may be at higher risk for Morton neuralgia if you: Are female. Wear high heels. Wear shoes that are narrow or tight. Do activities that repeatedly stretch your toes, such as: Running. Ballet. Long-distance walking. What are the signs or symptoms? The first symptom of Morton neuralgia is pain that spreads from the ball of the foot to the toes. It may feel like you are walking on a marble. Pain usually gets worse with walking and goes away at night. Other symptoms may include numbness and cramping of your toes. Both feet are equally affected, but rarely at the same time. How is this diagnosed? This condition is diagnosed based on your symptoms, your medical history, and a physical exam. Your health care provider may: Squeeze your foot just behind your toe. Ask you to move your toes to check for pain. Ask about your physical activity level. You also may have imaging tests, such as an X-ray, ultrasound, or MRI. How is this treated? Treatment depends on how severe your condition is and what causes it. Treatment may involve: Wearing different shoes that are not too tight, are low-heeled, and provide good support. For some people, this is the only treatment needed. Wearing an over-the-counter or custom supportive pad (orthotic) under the front of your  foot. Getting injections of numbing medicine and anti-inflammatory medicine (steroid) in the nerve. Having surgery to remove part of the thickened nerve. Follow these instructions at home: Managing pain, stiffness, and swelling  Massage your foot as needed. Wear orthotics as told by your health care provider. If directed, put ice on the painful area. To do this: Put ice in a plastic bag. Place a towel between your skin and the bag. Leave the ice on for 20 minutes, 2-3 times a day. Remove the ice if your skin turns bright red. This is very important. If you cannot feel pain, heat, or cold, you have a greater risk of damage to the area. Raise (elevate) the injured area above the level of your heart while you are sitting or lying down. Avoid activities that cause pain or make pain worse. If you play sports, ask your health care provider when it is safe for you to return to sports. General instructions Take over-the-counter and prescription medicines only as told by your health care provider. For the time period you were told by your health care provider, do not drive or use machinery. Wear shoes that: Have soft soles. Have a wide toe area. Provide arch support. Do not pinch or squeeze your feet. Have room for your orthotics, if this applies. Keep all follow-up visits. This is important. Contact a health care provider if: Your symptoms get worse or do not get better with treatment and home care. Summary Morton neuralgia is foot pain that affects the ball of the foot and the  area near the toes. Pain usually occurs between the third and fourth toes, gets worse with walking, and goes away at night. Morton neuralgia occurs when part of a nerve in the foot (digital nerve) is under too much pressure. When this happens over a long period of time, the nerve can thicken (neuroma) and cause pain. This condition is caused by doing the same things over and over with your foot, such as running or  jumping, wearing shoes that are too tight, or wearing high heels. Treatment may involve wearing low-heeled shoes that are not too tight, wearing a supportive pad (orthotic) under the front of your foot, getting injections in the nerve, or having surgery to remove part of the thickened nerve. This information is not intended to replace advice given to you by your health care provider. Make sure you discuss any questions you have with your health care provider. Document Revised: 10/31/2020 Document Reviewed: 10/31/2020 Elsevier Patient Education  2024 [employer].

## 2023-05-24 ENCOUNTER — Other Ambulatory Visit: Payer: Self-pay | Admitting: Family Medicine

## 2023-05-24 DIAGNOSIS — D696 Thrombocytopenia, unspecified: Secondary | ICD-10-CM

## 2023-05-24 LAB — LIPID PANEL
Cholesterol: 198 mg/dL (ref <200)
HDL: 53 mg/dL (ref >=50)
LDL Cholesterol (Calc): 122 mg/dL — ABNORMAL HIGH
Non-HDL Cholesterol (Calc): 145 mg/dL — ABNORMAL HIGH (ref <130)
Total CHOL/HDL Ratio: 3.7 (calc) (ref <5.0)
Triglycerides: 123 mg/dL (ref <150)

## 2023-05-24 LAB — COMPLETE METABOLIC PANEL WITH GFR
AG Ratio: 1.7 (calc) (ref 1.0–2.5)
ALT: 19 U/L (ref 6–29)
AST: 19 U/L (ref 10–35)
Albumin: 4.3 g/dL (ref 3.6–5.1)
Alkaline phosphatase (APISO): 55 U/L (ref 37–153)
BUN: 15 mg/dL (ref 7–25)
CO2: 28 mmol/L (ref 20–32)
Calcium: 9.3 mg/dL (ref 8.6–10.4)
Chloride: 105 mmol/L (ref 98–110)
Creat: 0.75 mg/dL (ref 0.50–1.05)
Globulin: 2.6 g/dL (ref 1.9–3.7)
Glucose, Bld: 91 mg/dL (ref 65–99)
Potassium: 4.2 mmol/L (ref 3.5–5.3)
Sodium: 140 mmol/L (ref 135–146)
Total Bilirubin: 0.6 mg/dL (ref 0.2–1.2)
Total Protein: 6.9 g/dL (ref 6.1–8.1)
eGFR: 86 mL/min/{1.73_m2} (ref >=60)

## 2023-05-24 LAB — CBC WITH DIFFERENTIAL/PLATELET
Absolute Lymphocytes: 1206 {cells}/uL (ref 850–3900)
Absolute Monocytes: 348 {cells}/uL (ref 200–950)
Basophils Absolute: 31 {cells}/uL (ref 0–200)
Basophils Relative: 0.7 %
Eosinophils Absolute: 88 {cells}/uL (ref 15–500)
Eosinophils Relative: 2 %
HCT: 40.9 % (ref 35.0–45.0)
Hemoglobin: 13.3 g/dL (ref 11.7–15.5)
MCH: 30.6 pg (ref 27.0–33.0)
MCHC: 32.5 g/dL (ref 32.0–36.0)
MCV: 94.2 fL (ref 80.0–100.0)
MPV: 9.9 fL (ref 7.5–12.5)
Monocytes Relative: 7.9 %
Neutro Abs: 2728 {cells}/uL (ref 1500–7800)
Neutrophils Relative %: 62 %
Platelets: 95 10*3/uL — ABNORMAL LOW (ref 140–400)
RBC: 4.34 10*6/uL (ref 3.80–5.10)
RDW: 12.1 % (ref 11.0–15.0)
Total Lymphocyte: 27.4 %
WBC: 4.4 10*3/uL (ref 3.8–10.8)

## 2023-06-06 LAB — CBC WITH DIFFERENTIAL/PLATELET
Absolute Lymphocytes: 1347 {cells}/uL (ref 850–3900)
Absolute Monocytes: 452 {cells}/uL (ref 200–950)
Basophils Absolute: 31 {cells}/uL (ref 0–200)
Basophils Relative: 0.6 %
Eosinophils Absolute: 130 {cells}/uL (ref 15–500)
Eosinophils Relative: 2.5 %
HCT: 40 % (ref 35.0–45.0)
Hemoglobin: 12.8 g/dL (ref 11.7–15.5)
MCH: 30.4 pg (ref 27.0–33.0)
MCHC: 32 g/dL (ref 32.0–36.0)
MCV: 95 fL (ref 80.0–100.0)
MPV: 9.6 fL (ref 7.5–12.5)
Monocytes Relative: 8.7 %
Neutro Abs: 3240 {cells}/uL (ref 1500–7800)
Neutrophils Relative %: 62.3 %
Platelets: 257 10*3/uL (ref 140–400)
RBC: 4.21 10*6/uL (ref 3.80–5.10)
RDW: 12.3 % (ref 11.0–15.0)
Total Lymphocyte: 25.9 %
WBC: 5.2 10*3/uL (ref 3.8–10.8)

## 2023-06-09 ENCOUNTER — Encounter: Payer: Self-pay | Admitting: Family Medicine

## 2023-07-07 ENCOUNTER — Ambulatory Visit (INDEPENDENT_AMBULATORY_CARE_PROVIDER_SITE_OTHER): Payer: Medicare Other

## 2023-07-07 DIAGNOSIS — Z0000 Encounter for general adult medical examination without abnormal findings: Secondary | ICD-10-CM | POA: Diagnosis not present

## 2023-07-07 NOTE — Progress Notes (Signed)
Subjective:   Alexandria Hill is a 68 y.o. female who presents for Medicare Annual (Subsequent) preventive examination.  Visit Complete: Virtual I connected with  Boneta Lucks on 07/07/23 by a video and audio enabled telemedicine application and verified that I am speaking with the correct person using two identifiers.  Patient Location: Home  Provider Location: Office/Clinic  I discussed the limitations of evaluation and management by telemedicine. The patient expressed understanding and agreed to proceed.  Vital Signs: Because this visit was a virtual/telehealth visit, some criteria may be missing or patient reported. Any vitals not documented were not able to be obtained and vitals that have been documented are patient reported.  Cardiac Risk Factors include: advanced age (>66men, >63 women);hypertension     Objective:    There were no vitals filed for this visit. There is no height or weight on file to calculate BMI.     07/07/2023    3:38 PM 06/09/2021    8:08 AM 03/17/2020    7:36 AM 06/13/2018    4:48 PM 03/31/2018    5:27 PM 03/28/2018    8:08 PM 10/08/2016    9:50 AM  Advanced Directives  Does Patient Have a Medical Advance Directive? No Yes Yes Yes No No Yes  Type of Special educational needs teacher of [place];Living will Healthcare Power of [place];Living will Healthcare Power of [place];Living will     Does patient want to make changes to medical advance directive?    Yes (Inpatient - patient requests chaplain consult to change a medical advance directive)     Copy of Healthcare Power of Attorney in Chart?  No - copy requested Yes - validated most recent copy scanned in chart (See row information) Yes - validated most recent copy scanned in chart (See row information)     Would patient like information on creating a medical advance directive? No - Patient declined    No - Patient declined No - Patient declined     Current Medications (verified) Outpatient  Encounter Medications as of 07/07/2023  Medication Sig   alendronate (FOSAMAX) 70 MG tablet TAKE 1 TABLET(70 MG) BY MOUTH EVERY 7 DAYS WITH A FULL GLASS OF WATER AND ON AN EMPTY STOMACH   Ascorbic Acid (VITAMIN C) 500 MG CAPS Take by mouth daily.    aspirin EC 81 MG tablet Take 81 mg by mouth daily. Swallow whole.   Calcium Citrate-Vitamin D 200-250 MG-UNIT TABS Take by mouth daily.   lisinopril (ZESTRIL) 10 MG tablet Take 1 tablet (10 mg total) by mouth daily.   Misc Natural Products (OSTEO BI-FLEX JOINT SHIELD) TABS Take by mouth daily.    Multiple Vitamin (MULTIVITAMIN) capsule Take 1 capsule by mouth daily.   No facility-administered encounter medications on file as of 07/07/2023.    Allergies (verified) No known allergies   History: Past Medical History:  Diagnosis Date   Arthritis    Cataract    GERD (gastroesophageal reflux disease)    Glaucoma 04/21/2021   Angle-closure glaucoma, recently had laser procedure on both eyes to correct.   Hyperlipidemia    Hypertension    IBS (irritable bowel syndrome) 2019   Osteopenia    Osteoporosis    Overweight (BMI 25.0-29.9) 08/11/2016   Reflux    Renal calculi 07/2017   Past Surgical History:  Procedure Laterality Date   CESAREAN SECTION  06/07/1985   COLONOSCOPY WITH PROPOFOL N/A 12/05/2014   Procedure: COLONOSCOPY WITH PROPOFOL;  Surgeon: Midge Minium, MD;  Location: [facility];  Service: Endoscopy;  Laterality: N/A;   COLONOSCOPY WITH PROPOFOL N/A 03/17/2020   Procedure: COLONOSCOPY WITH PROPOFOL;  Surgeon: Midge Minium, MD;  Location: [facility];  Service: Endoscopy;  Laterality: N/A;   EXTRACORPOREAL SHOCK WAVE LITHOTRIPSY Left 07/14/2017   Procedure: EXTRACORPOREAL SHOCK WAVE LITHOTRIPSY (ESWL);  Surgeon: Riki Altes, MD;  Location: [facility];  Service: Urology;  Laterality: Left;   Family History  Problem Relation Age of Onset   Aneurysm Mother        brain   Hypertension Mother    Stroke Mother         possibly mini strokes   Arthritis Mother    Heart disease Father    Heart attack Father    Mental retardation Father    Kidney disease Father    Cancer Brother    Diabetes Maternal Aunt    Hypertension Sister    Obesity Sister    Mental illness Sister    Cancer Maternal Grandmother    Hypertension Sister    Miscarriages / Stillbirths Sister    Obesity Sister    Mental illness Sister    Depression Sister    Obesity Sister    Depression Sister    Hypertension Sister    Obesity Sister    Hypertension Sister    Depression Sister    ADD / ADHD Sister    Cancer Sister    COPD Neg Hx    Breast cancer Neg Hx    Social History   Socioeconomic History   Marital status: Divorced    Spouse name: Not on file   Number of children: 2   Years of education: 12   Highest education level: 12th grade  Occupational History   Not on file  Tobacco Use   Smoking status: Former    Current packs/day: 0.00    Average packs/day: 0.5 packs/day for 10.0 years (5.0 ttl pk-yrs)    Types: Cigarettes    Start date: 04/24/1975    Quit date: 04/23/1985    Years since quitting: 38.2   Smokeless tobacco: Never  Vaping Use   Vaping status: Never Used  Substance and Sexual Activity   Alcohol use: Yes    Comment: occassionally   Drug use: Never   Sexual activity: Not Currently    Partners: Male    Birth control/protection: Post-menopausal  Other Topics Concern   Not on file  Social History Narrative   Not on file   Social Drivers of Health   Financial Resource Strain: Low Risk  (07/07/2023)   Overall Financial Resource Strain (CARDIA)    Difficulty of Paying Living Expenses: Not hard at all  Food Insecurity: No Food Insecurity (07/07/2023)   Hunger Vital Sign    Worried About Running Out of Food in the Last Year: Never true    Ran Out of Food in the Last Year: Never true  Transportation Needs: No Transportation Needs (07/07/2023)   PRAPARE - Administrator, Civil Service  (Medical): No    Lack of Transportation (Non-Medical): No  Physical Activity: Insufficiently Active (07/07/2023)   Exercise Vital Sign    Days of Exercise per Week: 4 days    Minutes of Exercise per Session: 30 min  Stress: No Stress Concern Present (07/07/2023)   [employer] of Occupational Health - Occupational Stress Questionnaire    Feeling of Stress : Not at all  Social Connections: Moderately Integrated (07/07/2023)   Social Connection and Isolation Panel [  NHANES]    Frequency of Communication with Friends and Family: More than three times a week    Frequency of Social Gatherings with Friends and Family: Twice a week    Attends Religious Services: More than 4 times per year    Active Member of [employer] or Organizations: Yes    Attends Engineer, structural: More than 4 times per year    Marital Status: Divorced    Tobacco Counseling Counseling given: Not Answered   Clinical Intake:  Pre-visit preparation completed: Yes  Pain : No/denies pain     BMI - recorded: 28.5 Nutritional Status: BMI 25 -29 Overweight Nutritional Risks: None Diabetes: No  How often do you need to have someone help you when you read instructions, pamphlets, or other written materials from your doctor or pharmacy?: 1 - Never  Interpreter Needed?: No  Information entered by :: Kennedy Bucker, LPN   Activities of Daily Living    07/07/2023    3:40 PM 07/03/2023    4:32 PM  In your present state of health, do you have any difficulty performing the following activities:  Hearing? 0 0  Vision? 0 0  Difficulty concentrating or making decisions? 0 0  Walking or climbing stairs? 0 0  Dressing or bathing? 0 0  Doing errands, shopping? 0 0  Preparing Food and eating ? N N  Using the Toilet? N N  In the past six months, have you accidently leaked urine? N N  Do you have problems with loss of bowel control? N N  Managing your Medications? N N  Managing your Finances? N N  Housekeeping  or managing your Housekeeping? N N    Patient Care Team: Alba Cory, MD as PCP - General (Family Medicine) Debbrah Alar, MD (Dermatology) Midge Minium, MD as Consulting Physician (Gastroenterology) Lockie Mola, MD as Referring Physician (Ophthalmology)  Indicate any recent Medical Services you may have received from other than [facility] providers in the past year (date may be approximate).     Assessment:   This is a routine wellness examination for Alexandria Hill.  Hearing/Vision screen Hearing Screening - Comments:: NO AIDS Vision Screening - Comments:: WEARS GLASSES ALL THE TIME- DR.BRASINGTON   Goals Addressed             This Visit's Progress    DIET - EAT MORE FRUITS AND VEGETABLES         Depression Screen    07/07/2023    3:36 PM 05/23/2023    8:05 AM 06/16/2022    9:17 AM 05/21/2022    7:54 AM 06/09/2021    8:08 AM 05/20/2021    8:20 AM 11/18/2020    8:58 AM  PHQ 2/9 Scores  PHQ - 2 Score 0 0 0 0 0 0 0  PHQ- 9 Score 0  0 0       Fall Risk    07/07/2023    3:39 PM 07/03/2023    4:32 PM 05/23/2023    8:01 AM 06/16/2022    9:17 AM 05/21/2022    7:54 AM  Fall Risk   Falls in the past year? 1 1 1  0 0  Number falls in past yr: 0 0 0 0 0  Injury with Fall? 0 1 1 0 0  Risk for fall due to : History of fall(s)  Impaired balance/gait No Fall Risks No Fall Risks  Follow up Falls evaluation completed;Falls prevention discussed  Falls prevention discussed;Education provided;Falls evaluation completed Falls prevention discussed;Education provided;Falls  evaluation completed Falls prevention discussed    MEDICARE RISK AT HOME: Medicare Risk at Home Any stairs in or around the home?: No If so, are there any without handrails?: No Home free of loose throw rugs in walkways, pet beds, electrical cords, etc?: Yes Adequate lighting in your home to reduce risk of falls?: Yes Life alert?: No Use of a cane, walker or w/c?: No Grab bars in the bathroom?: Yes Shower  chair or bench in shower?: Yes Elevated toilet seat or a handicapped toilet?: No  TIMED UP AND GO:  Was the test performed?  No    Cognitive Function:        07/07/2023    3:41 PM 06/16/2022   10:34 AM  6CIT Screen  What Year? 0 points 0 points  What month? 0 points 0 points  What time? 0 points 0 points  Count back from 20 0 points 0 points  Months in reverse 0 points 0 points  Repeat phrase 0 points 0 points  Total Score 0 points 0 points    Immunizations Immunization History  Administered Date(s) Administered   Fluad Quad(high Dose 65+) 03/06/2019, 03/04/2020, 03/10/2021, 03/05/2022   Fluad Trivalent(High Dose 65+) 03/08/2023   Influenza Inj Mdck Quad Pf 03/05/2018   Influenza,inj,Quad PF,6+ Mos 03/08/2017   Influenza-Unspecified 04/07/2015, 03/05/2018   PFIZER(Purple Top)SARS-COV-2 Vaccination 07/18/2019, 08/08/2019, 03/28/2020   Pneumococcal Conjugate-13 03/06/2019   Pneumococcal Polysaccharide-23 05/12/2020   Tdap 01/06/2012, 12/15/2013   Zoster Recombinant(Shingrix) 06/05/2019, 10/03/2019    TDAP status: Up to date  Flu Vaccine status: Up to date  Pneumococcal vaccine status: Up to date  Covid-19 vaccine status: Completed vaccines  Qualifies for Shingles Vaccine? Yes   Zostavax completed No   Shingrix Completed?: Yes  Screening Tests Health Maintenance  Topic Date Due   COVID-19 Vaccine (4 - 2024-25 season) 02/06/2023   MAMMOGRAM  07/28/2023   DTaP/Tdap/Td (3 - Td or Tdap) 12/16/2023   Medicare Annual Wellness (AWV)  07/06/2024   DEXA SCAN  07/27/2024   Colonoscopy  03/18/2027   Pneumonia Vaccine 36+ Years old  Completed   INFLUENZA VACCINE  Completed   Hepatitis C Screening  Completed   Zoster Vaccines- Shingrix  Completed   HPV VACCINES  Aged Out    Health Maintenance  Health Maintenance Due  Topic Date Due   COVID-19 Vaccine (4 - 2024-25 season) 02/06/2023    Colorectal cancer screening: Type of screening: Colonoscopy. Completed  03/17/20. Repeat every 7 years  Mammogram status: Completed SCHEDULED FOR 08/02/23. Repeat every year  Bone Density status: Completed 07/27/22. Results reflect: Bone density results: OSTEOPOROSIS. Repeat every 2 years.  Lung Cancer Screening: (Low Dose CT Chest recommended if Age 60-80 years, 20 pack-year currently smoking OR have quit w/in 15years.) does not qualify.    Additional Screening:  Hepatitis C Screening: does qualify; Completed 05/11/19  Vision Screening: Recommended annual ophthalmology exams for early detection of glaucoma and other disorders of the eye. Is the patient up to date with their annual eye exam?  Yes  Who is the provider or what is the name of the office in which the patient attends annual eye exams? DR.BRASINGTON If pt is not established with a provider, would they like to be referred to a provider to establish care? No .   Dental Screening: Recommended annual dental exams for proper oral hygiene   Community Resource Referral / Chronic Care Management: CRR required this visit?  No   CCM required this visit?  No  Plan:     I have personally reviewed and noted the following in the patient's chart:   Medical and social history Use of alcohol, tobacco or illicit drugs  Current medications and supplements including opioid prescriptions. Patient is not currently taking opioid prescriptions. Functional ability and status Nutritional status Physical activity Advanced directives List of other physicians Hospitalizations, surgeries, and ER visits in previous 12 months Vitals Screenings to include cognitive, depression, and falls Referrals and appointments  In addition, I have reviewed and discussed with patient certain preventive protocols, quality metrics, and best practice recommendations. A written personalized care plan for preventive services as well as general preventive health recommendations were provided to patient.     Hal Hope,  LPN   09/13/[id number]   After Visit Summary: (MyChart) Due to this being a telephonic visit, the after visit summary with patients personalized plan was offered to patient via MyChart   Nurse Notes: NONE

## 2023-07-07 NOTE — Patient Instructions (Addendum)
Alexandria Hill , Thank you for taking time to come for your Medicare Wellness Visit. I appreciate your ongoing commitment to your health goals. Please review the following plan we discussed and let me know if I can assist you in the future.   Referrals/Orders/Follow-Ups/Clinician Recommendations: NONE  This is a list of the screening recommended for you and due dates:  Health Maintenance  Topic Date Due   COVID-19 Vaccine (4 - 2024-25 season) 02/06/2023   Mammogram  07/28/2023   DTaP/Tdap/Td vaccine (3 - Td or Tdap) 12/16/2023   Medicare Annual Wellness Visit  07/06/2024   DEXA scan (bone density measurement)  07/27/2024   Colon Cancer Screening  03/18/2027   Pneumonia Vaccine  Completed   Flu Shot  Completed   Hepatitis C Screening  Completed   Zoster (Shingles) Vaccine  Completed   HPV Vaccine  Aged Out    Advanced directives: (ACP Link)Information on Advanced Care Planning can be found at [facility] [place] Advance Health Care Directives Advance Health Care Directives ([email or web address]   Next Medicare Annual Wellness Visit scheduled for next year: Yes   07/19/24 @ 3:50 PM BY VIDEO

## 2023-08-02 ENCOUNTER — Ambulatory Visit
Admission: RE | Admit: 2023-08-02 | Discharge: 2023-08-02 | Disposition: A | Discharge status: Home / Self Care | Payer: Medicare Other | Source: Ambulatory Visit | Attending: Family Medicine | Admitting: Family Medicine

## 2023-08-02 DIAGNOSIS — Z1231 Encounter for screening mammogram for malignant neoplasm of breast: Secondary | ICD-10-CM | POA: Insufficient documentation

## 2023-12-23 ENCOUNTER — Ambulatory Visit: Payer: Self-pay | Admitting: *Deleted

## 2023-12-23 NOTE — Telephone Encounter (Signed)
 Tried contacting 3x and it keep saying call cannot be completed as dailed

## 2023-12-23 NOTE — Telephone Encounter (Signed)
 FYI Only or Action Required?: Action required by provider: request for appointment.  Patient was last seen in primary care on 05/23/2023 by Glenard Mire, MD.  Called Nurse Triage reporting Bleeding/Bruising.  Symptoms began today.  Interventions attempted: Nothing.  Symptoms are: stable.  Triage Disposition: See PCP Within 2 Weeks  Patient/caregiver understands and will follow disposition?: Yes             Copied from CRM [id number]. Topic: Clinical - Red Word Triage >> Dec 23, 2023  9:21 AM Alexandria Hill wrote: Red Word that prompted transfer to Nurse Triage: Patient states woke up this morning spitting up blood. Not sure if just from a dry throat as she is a mouth breather when she sleeps or because of a spot on her lung they found last year. Reason for Disposition  Dry mouth is a chronic symptom (recurrent or ongoing AND present > 4 weeks)  Answer Assessment - Initial Assessment Questions Earliest appt with PCP scheduled for 02/02/24. Please advise if patient can be seen earlier by PCP . Requesting call back.       1. SYMPTOM: What's the main symptom you're concerned about? (e.g., chapped lips, dry mouth, lump, sores)     Spitting up blood this am  2. ONSET: When did the  sx  start?     Blood mixed in with saliva 3. PAIN: Is there any pain? If Yes, ask: How bad is it? (Scale: 0-10; or none, mild, moderate, severe)     na 4. CAUSE: What do you think is causing the symptoms?     Not sure .  5. OTHER SYMPTOMS: Do you have any other symptoms? (e.g., fever, sore throat, toothache, swelling)     Spitting blood this am small amount mixed with saliva. C/o dry mouth .Hx lung issues. Snoring requesting to assess sleep apnea 6. PREGNANCY: Is there any chance you are pregnant? When was your last menstrual period?     na  Protocols used: Mouth Symptoms-A-AH

## 2023-12-23 NOTE — Telephone Encounter (Signed)
 Pt needs to be seen to be evaluated can see other provider if they have availability.

## 2024-02-02 ENCOUNTER — Encounter: Payer: Self-pay | Admitting: Family Medicine

## 2024-02-02 ENCOUNTER — Ambulatory Visit (INDEPENDENT_AMBULATORY_CARE_PROVIDER_SITE_OTHER): Admitting: Family Medicine

## 2024-02-02 VITALS — BP 124/80 | HR 98 | Resp 16 | Ht 62.0 in | Wt 157.4 lb

## 2024-02-02 DIAGNOSIS — Z87891 Personal history of nicotine dependence: Secondary | ICD-10-CM | POA: Diagnosis not present

## 2024-02-02 DIAGNOSIS — R042 Hemoptysis: Secondary | ICD-10-CM

## 2024-02-02 DIAGNOSIS — J342 Deviated nasal septum: Secondary | ICD-10-CM

## 2024-02-02 DIAGNOSIS — J3089 Other allergic rhinitis: Secondary | ICD-10-CM

## 2024-02-02 DIAGNOSIS — R0683 Snoring: Secondary | ICD-10-CM | POA: Diagnosis not present

## 2024-02-02 MED ORDER — LEVOCETIRIZINE DIHYDROCHLORIDE 5 MG PO TABS: 5.0000 mg | ORAL_TABLET | Freq: Every evening | ORAL | 1 refills | Status: AC

## 2024-02-02 MED ORDER — FLUTICASONE PROPIONATE 50 MCG/ACT NA SUSP: 2.0000 | Freq: Every evening | NASAL | 3 refills | Status: AC

## 2024-02-02 MED ORDER — AZELASTINE HCL 0.1 % NA SOLN: 2.0000 | NASAL | 3 refills | Status: AC

## 2024-02-02 NOTE — Progress Notes (Signed)
 Name: Alexandria Hill   MRN: [id number]    DOB: Jul 02, 1955   Date:02/02/2024       Progress Note  Subjective  Chief Complaint  Chief Complaint  Patient presents with   Nasal Congestion    On July 18th, Patient states woke up that morning spitting up blood, one time thing.  Pt states in the morning she has congestion tries to blow out or cough it up    Discussed the use of AI scribe software for clinical note transcription with the patient, who gave verbal consent to proceed.  History of Present Illness Alexandria Hill is a 68 year old female with lung nodules who presents with coughing up blood and congestion.  In July, she experienced hemoptysis, describing it as blood mixed with mucus after spitting multiple times. She is uncertain if the blood originated from her throat or nose. She has a history of lung nodules, with the last imaging in 2019 showing stable calcified nodules in the right hilum due to previous granulomatous disease. She is a former smoker, having quit in her thirties.  She reports increased congestion and occasional runny nose, with no specific seasonal variation. She experiences watery eyes and was prescribed allergy drops by her eye doctor earlier this year. No medication is taken for congestion, and she has not been tested for allergies, although there is a family history of allergies. Occasional itchy eyes and throat are noted, but no sneezing.  She has a history of snoring and mouth breathing at night, leading to a dry mouth upon waking. A sleep apnea test in the 1990s was negative. She describes herself as a morning person but feels tired in the late afternoon.    Patient Active Problem List   Diagnosis Date Noted   Low back pain 05/11/2021   Age-related osteoporosis without current pathological fracture 11/18/2020   Atherosclerosis of aorta (HCC) 05/12/2020   History of colonic polyps    Recurrent UTI 01/30/2019   Lung nodule, solitary 05/09/2018   IBS  (irritable bowel syndrome) 11/14/2017   Sciatic nerve pain, right 11/14/2017   Personal history of urinary calculi 11/14/2017   Essential hypertension, benign 08/11/2016   Overweight (BMI 25.0-29.9) 08/11/2016    Past Surgical History:  Procedure Laterality Date   CESAREAN SECTION  06/07/1985   COLONOSCOPY WITH PROPOFOL  N/A 12/05/2014   Procedure: COLONOSCOPY WITH PROPOFOL ;  Surgeon: Rogelia Copping, MD;  Location: [facility];  Service: Endoscopy;  Laterality: N/A;   COLONOSCOPY WITH PROPOFOL  N/A 03/17/2020   Procedure: COLONOSCOPY WITH PROPOFOL ;  Surgeon: Copping Rogelia, MD;  Location: [facility];  Service: Endoscopy;  Laterality: N/A;   EXTRACORPOREAL SHOCK WAVE LITHOTRIPSY Left 07/14/2017   Procedure: EXTRACORPOREAL SHOCK WAVE LITHOTRIPSY (ESWL);  Surgeon: Twylla Glendia BROCKS, MD;  Location: [facility];  Service: Urology;  Laterality: Left;    Family History  Problem Relation Age of Onset   Aneurysm Mother        brain   Hypertension Mother    Stroke Mother        possibly mini strokes   Arthritis Mother    Heart disease Father    Heart attack Father    Mental retardation Father    Kidney disease Father    Cancer Brother    Diabetes Maternal Aunt    Hypertension Sister    Obesity Sister    Mental illness Sister    Cancer Maternal Grandmother    Hypertension Sister    Miscarriages / Stillbirths Sister  Obesity Sister    Mental illness Sister    Depression Sister    Obesity Sister    Depression Sister    Hypertension Sister    Obesity Sister    Hypertension Sister    Depression Sister    ADD / ADHD Sister    Cancer Sister    COPD Neg Hx    Breast cancer Neg Hx     Social History   Tobacco Use   Smoking status: Former    Current packs/day: 0.00    Average packs/day: 0.5 packs/day for 10.0 years (5.0 ttl pk-yrs)    Types: Cigarettes    Start date: 04/24/1975    Quit date: 04/23/1985    Years since quitting: 38.8   Smokeless tobacco: Never  Substance  Use Topics   Alcohol use: Yes    Comment: occassionally     Current Outpatient Medications:    alendronate  (FOSAMAX ) 70 MG tablet, TAKE 1 TABLET(70 MG) BY MOUTH EVERY 7 DAYS WITH A FULL GLASS OF WATER  AND ON AN EMPTY STOMACH, Disp: 12 tablet, Rfl: 3   Ascorbic Acid (VITAMIN C) 500 MG CAPS, Take by mouth daily. , Disp: , Rfl:    aspirin EC 81 MG tablet, Take 81 mg by mouth daily. Swallow whole., Disp: , Rfl:    Calcium Citrate-Vitamin D  200-250 MG-UNIT TABS, Take by mouth daily., Disp: , Rfl:    lisinopril  (ZESTRIL ) 10 MG tablet, Take 1 tablet (10 mg total) by mouth daily., Disp: 90 tablet, Rfl: 3   Multiple Vitamin (MULTIVITAMIN) capsule, Take 1 capsule by mouth daily., Disp: , Rfl:    Misc Natural Products (OSTEO BI-FLEX JOINT SHIELD) TABS, Take by mouth daily.  (Patient not taking: Reported on 02/02/2024), Disp: , Rfl:   Allergies  Allergen Reactions   No Known Allergies     I personally reviewed active problem list, medication list, allergies, family history with the patient/caregiver today.   ROS  Ten systems reviewed and is negative except as mentioned in HPI    Objective Physical Exam CONSTITUTIONAL: Patient appears well-developed and well-nourished. No distress. HEENT: Head atraumatic, normocephalic, neck supple. Throat normal on inspection. Nasal passages congested with green discharge. CARDIOVASCULAR: Normal rate, regular rhythm and normal heart sounds. No murmur heard. No BLE edema. PULMONARY: Effort normal and breath sounds normal. No respiratory distress. Lungs clear to auscultation bilaterally. MUSCULOSKELETAL: Normal gait. Without gross motor or sensory deficit. PSYCHIATRIC: Patient has a normal mood and affect. Behavior is normal. Judgment and thought content normal.  Vitals:   02/02/24 1032  BP: 124/80  Pulse: 98  Resp: 16  SpO2: 100%  Weight: 157 lb 6.4 oz (71.4 kg)  Height: 5' 2 (1.575 m)    Body mass index is 28.79 kg/m.    PHQ2/9:    02/02/2024    10:25 AM 07/07/2023    3:36 PM 05/23/2023    8:05 AM 06/16/2022    9:17 AM 05/21/2022    7:54 AM  Depression screen PHQ 2/9  Decreased Interest 0 0 0 0 0  Down, Depressed, Hopeless 0 0 0 0 0  PHQ - 2 Score 0 0 0 0 0  Altered sleeping  0  0 0  Tired, decreased energy  0  0 0  Change in appetite  0  0 0  Feeling bad or failure about yourself   0  0 0  Trouble concentrating  0  0 0  Moving slowly or fidgety/restless  0  0 0  Suicidal thoughts  0  0 0  PHQ-9 Score  0  0 0  Difficult doing work/chores  Not difficult at all  Not difficult at all     phq 9 is negative  Fall Risk:    02/02/2024   10:25 AM 07/07/2023    3:39 PM 07/03/2023    4:32 PM 05/23/2023    8:01 AM 06/16/2022    9:17 AM  Fall Risk   Falls in the past year? 0 1 1 1  0  Number falls in past yr: 0 0 0 0 0  Injury with Fall? 0 0 1 1 0  Risk for fall due to : No Fall Risks History of fall(s)  Impaired balance/gait No Fall Risks  Follow up Falls evaluation completed Falls evaluation completed;Falls prevention discussed  Falls prevention discussed;Education provided;Falls evaluation completed Falls prevention discussed;Education provided;Falls evaluation completed      Data saved with a previous flowsheet row definition      Assessment & Plan Allergic rhinitis with nasal congestion and postnasal drainage Chronic nasal congestion and postnasal drainage likely due to allergic rhinitis. Symptoms include runny nose, congestion, itchy eyes, and throat. Possible environmental allergens. Nasal congestion may contribute to mouth breathing and snoring. - Prescribe Astelin  nasal spray for morning use. - Prescribe fluticasone  nasal spray for twice daily use, cautioning against potential bleeding. - Prescribe Xyzal  for daily use. - Instruct on proper nasal spray technique to minimize bleeding risk. - Advise to report symptom improvement or worsening in 2-3 weeks. - Consider ENT referral if symptoms persist or  worsen.  Snoring and suspected upper airway obstruction due to deviated nasal septum Snoring and possible upper airway obstruction due to deviated nasal septum. Current ESS score is 6, not justifying a sleep study. Nasal congestion may contribute to mouth breathing and snoring. - Treat nasal congestion with prescribed medications. - Consider ENT referral if symptoms persist or worsen.  Hemoptysis likely secondary to postnasal drainage Hemoptysis likely due to postnasal drainage rather than pulmonary origin. Blood was mixed with mucus, suggesting sinus drainage. No recent changes in pulmonary nodules. - Order CT chest to evaluate for any changes in pulmonary nodules. - Consider ENT referral if hemoptysis recurs and CT is normal.  Stable pulmonary nodules with history of granulomatous disease Pulmonary nodules with history of granulomatous disease. Previous CT showed unchanged nodules over two years. No new symptoms suggestive of malignancy. - Order CT chest to evaluate for any changes in pulmonary nodules.  History of tobacco use Tobacco use ceased in her thirties. Not a candidate for CT lung cancer screening due to non-smoking status.  General Health Maintenance Discussed immunizations and preventative health measures. - Tetanus shot last received in 2015, advised to get at pharmacy. - Flu shot recommended in October, advised to get at clinic.

## 2024-02-09 ENCOUNTER — Ambulatory Visit
Admission: RE | Admit: 2024-02-09 | Discharge: 2024-02-09 | Disposition: A | Discharge status: Home / Self Care | Source: Ambulatory Visit | Attending: Family Medicine | Admitting: Family Medicine

## 2024-02-09 DIAGNOSIS — R042 Hemoptysis: Secondary | ICD-10-CM | POA: Diagnosis present

## 2024-02-10 ENCOUNTER — Other Ambulatory Visit: Payer: Self-pay | Admitting: Family Medicine

## 2024-02-10 DIAGNOSIS — I10 Essential (primary) hypertension: Secondary | ICD-10-CM

## 2024-02-21 ENCOUNTER — Ambulatory Visit: Payer: Self-pay | Admitting: Family Medicine

## 2024-02-28 ENCOUNTER — Other Ambulatory Visit: Payer: Self-pay

## 2024-02-28 ENCOUNTER — Encounter: Payer: Self-pay | Admitting: Family Medicine

## 2024-02-28 DIAGNOSIS — R042 Hemoptysis: Secondary | ICD-10-CM

## 2024-03-01 ENCOUNTER — Other Ambulatory Visit: Payer: Self-pay | Admitting: Family Medicine

## 2024-03-01 DIAGNOSIS — J342 Deviated nasal septum: Secondary | ICD-10-CM

## 2024-03-01 DIAGNOSIS — J3089 Other allergic rhinitis: Secondary | ICD-10-CM

## 2024-03-01 DIAGNOSIS — R0982 Postnasal drip: Secondary | ICD-10-CM

## 2024-03-13 ENCOUNTER — Ambulatory Visit (INDEPENDENT_AMBULATORY_CARE_PROVIDER_SITE_OTHER)

## 2024-03-13 DIAGNOSIS — Z23 Encounter for immunization: Secondary | ICD-10-CM | POA: Diagnosis not present

## 2024-05-23 ENCOUNTER — Encounter: Payer: Self-pay | Admitting: Family Medicine

## 2024-05-23 ENCOUNTER — Ambulatory Visit (INDEPENDENT_AMBULATORY_CARE_PROVIDER_SITE_OTHER): Payer: Self-pay | Admitting: Family Medicine

## 2024-05-23 VITALS — BP 116/70 | HR 70 | Resp 16 | Ht 62.0 in | Wt 157.2 lb

## 2024-05-23 DIAGNOSIS — Z1231 Encounter for screening mammogram for malignant neoplasm of breast: Secondary | ICD-10-CM | POA: Diagnosis not present

## 2024-05-23 DIAGNOSIS — E785 Hyperlipidemia, unspecified: Secondary | ICD-10-CM

## 2024-05-23 DIAGNOSIS — M810 Age-related osteoporosis without current pathological fracture: Secondary | ICD-10-CM

## 2024-05-23 DIAGNOSIS — I700 Atherosclerosis of aorta: Secondary | ICD-10-CM

## 2024-05-23 DIAGNOSIS — J3089 Other allergic rhinitis: Secondary | ICD-10-CM

## 2024-05-23 DIAGNOSIS — I10 Essential (primary) hypertension: Secondary | ICD-10-CM

## 2024-05-23 MED ORDER — LISINOPRIL 10 MG PO TABS: 10.0000 mg | ORAL_TABLET | Freq: Every day | ORAL | 3 refills | Status: AC

## 2024-05-23 MED ORDER — MONTELUKAST SODIUM 10 MG PO TABS: 10.0000 mg | ORAL_TABLET | Freq: Every day | ORAL | 3 refills | Status: AC

## 2024-05-23 MED ORDER — ALENDRONATE SODIUM 70 MG PO TABS: ORAL_TABLET | ORAL | 3 refills | Status: AC

## 2024-05-23 NOTE — Progress Notes (Signed)
 Name: Alexandria Hill   MRN: [id number]    DOB: August 07, 1955   Date:05/23/2024       Progress Note  Subjective  Chief Complaint  Chief Complaint  Patient presents with   Medical Management of Chronic Issues   Discussed the use of AI scribe software for clinical note transcription with the patient, who gave verbal consent to proceed.  History of Present Illness Alexandria Hill is a 68 year old female who presents for a routine follow-up visit.  She reports allergy symptoms that persist throughout the year. She underwent allergy testing and uses an air purifier in her room. She takes Xyzal  (levocetirizine) as needed, especially during severe allergy seasons. She also uses azelastine  nasal spray twice daily, but reports that Flonase  nasal spray causes nosebleeds.  She has been taking alendronate  for osteoporosis since 2021, after being diagnosed with osteopenia in 2019. She takes over-the-counter vitamin D  supplements.  She takes lisinopril  daily for hypertension and reports no issues with the medication. She does not regularly monitor her blood pressure at home. No chest pain, palpitations, or racing heart. She has a history of high cholesterol but has chosen not to take statins and takes aspirin occasionally.  She has a history of kidney stones but reports no recent issues with low back pain, recurrent UTIs, or sciatica. Her IBS occasionally flares up, but she manages it by avoiding certain foods.  She is up to date with her vaccinations, having completed her shingles vaccination series and receiving an annual flu shot. There are no changes in her family history, and all family members are still alive.    Patient Active Problem List   Diagnosis Date Noted   Age-related osteoporosis without current pathological fracture 11/18/2020   Atherosclerosis of aorta 05/12/2020   History of colonic polyps    Lung nodule, solitary 05/09/2018   IBS (irritable bowel syndrome) 11/14/2017   Personal  history of urinary calculi 11/14/2017   Essential hypertension, benign 08/11/2016   Overweight (BMI 25.0-29.9) 08/11/2016    Past Surgical History:  Procedure Laterality Date   CESAREAN SECTION  06/07/1985   COLONOSCOPY WITH PROPOFOL  N/A 12/05/2014   Procedure: COLONOSCOPY WITH PROPOFOL ;  Surgeon: Rogelia Copping, MD;  Location: [facility];  Service: Endoscopy;  Laterality: N/A;   COLONOSCOPY WITH PROPOFOL  N/A 03/17/2020   Procedure: COLONOSCOPY WITH PROPOFOL ;  Surgeon: Copping Rogelia, MD;  Location: [facility];  Service: Endoscopy;  Laterality: N/A;   EXTRACORPOREAL SHOCK WAVE LITHOTRIPSY Left 07/14/2017   Procedure: EXTRACORPOREAL SHOCK WAVE LITHOTRIPSY (ESWL);  Surgeon: Twylla Glendia BROCKS, MD;  Location: [facility];  Service: Urology;  Laterality: Left;    Family History  Problem Relation Age of Onset   Aneurysm Mother        brain   Hypertension Mother    Stroke Mother        possibly mini strokes   Arthritis Mother    Heart disease Father    Heart attack Father    Mental retardation Father    Kidney disease Father    Cancer Brother    Diabetes Maternal Aunt    Hypertension Sister    Obesity Sister    Mental illness Sister    Cancer Maternal Grandmother    Hypertension Sister    Miscarriages / Stillbirths Sister    Obesity Sister    Mental illness Sister    Depression Sister    Obesity Sister    Depression Sister    Hypertension Sister    Obesity  Sister    Hypertension Sister    Depression Sister    ADD / ADHD Sister    Cancer Sister    COPD Neg Hx    Breast cancer Neg Hx     Social History   Tobacco Use   Smoking status: Former    Current packs/day: 0.00    Average packs/day: 0.5 packs/day for 10.0 years (5.0 ttl pk-yrs)    Types: Cigarettes    Start date: 04/24/1975    Quit date: 04/23/1985    Years since quitting: 39.1   Smokeless tobacco: Never  Substance Use Topics   Alcohol use: Yes    Comment: occassionally    Current  Medications[1]  Allergies[2]  I personally reviewed active problem list, medication list, allergies with the patient/caregiver today.   ROS  Ten systems reviewed and is negative except as mentioned in HPI    Objective Physical Exam CONSTITUTIONAL: Patient appears well-developed and well-nourished. No distress. HEENT: Head atraumatic, normocephalic, neck supple. CARDIOVASCULAR: Normal rate, regular rhythm and normal heart sounds. No murmur heard. No BLE edema. PULMONARY: Effort normal and breath sounds normal. Lungs clear to auscultation. No respiratory distress. ABDOMINAL: There is no tenderness or distention. MUSCULOSKELETAL: Normal gait. Without gross motor or sensory deficit. PSYCHIATRIC: Patient has a normal mood and affect. Behavior is normal. Judgment and thought content normal.  Vitals:   05/23/24 0750  BP: 116/70  Pulse: 70  Resp: 16  SpO2: 97%  Weight: 157 lb 3.2 oz (71.3 kg)  Height: 5' 2 (1.575 m)    Body mass index is 28.75 kg/m.   PHQ2/9:    05/23/2024    7:48 AM 02/02/2024   10:25 AM 07/07/2023    3:36 PM 05/23/2023    8:05 AM 06/16/2022    9:17 AM  Depression screen PHQ 2/9  Decreased Interest 0 0 0 0 0  Down, Depressed, Hopeless 0 0 0 0 0  PHQ - 2 Score 0 0 0 0 0  Altered sleeping   0  0  Tired, decreased energy   0  0  Change in appetite   0  0  Feeling bad or failure about yourself    0  0  Trouble concentrating   0  0  Moving slowly or fidgety/restless   0  0  Suicidal thoughts   0  0  PHQ-9 Score   0   0   Difficult doing work/chores   Not difficult at all  Not difficult at all     Data saved with a previous flowsheet row definition    phq 9 is negative  Fall Risk:    05/23/2024    7:48 AM 02/02/2024   10:25 AM 07/07/2023    3:39 PM 07/03/2023    4:32 PM 05/23/2023    8:01 AM  Fall Risk   Falls in the past year? 0 0 1 1  1   Number falls in past yr: 0 0 0 0  0  Injury with Fall? 0 0  0  1   1   Risk for fall due to : No Fall Risks  No Fall Risks History of fall(s)  Impaired balance/gait  Follow up Falls evaluation completed Falls evaluation completed Falls evaluation completed;Falls prevention discussed  Falls prevention discussed;Education provided;Falls evaluation completed     Manually entered by patient   Data saved with a previous flowsheet row definition     Assessment & Plan   Perennial allergic rhinitis Chronic allergic rhinitis managed  with Xyzal . Discussed montelukast  and nasal spray technique. - Continue Xyzal  as needed. - Consider over-the-counter loratadine in the morning during allergy season. - Prescribed montelukast  for seasonal use, starting in March and tapering off by May. - Educated on proper nasal spray technique to avoid epistaxis.  Age-related osteoporosis Osteoporosis with improved bone density on alendronate . Bone density test due in 2026. - Ordered bone density test for 2026. - Continue alendronate  with a plan to reassess in 2026.  Essential hypertension Hypertension managed with lisinopril . No issues reported. - Continue lisinopril  daily. - Encouraged regular home blood pressure monitoring.  Atherosclerosis of aorta Atherosclerosis with elevated LDL. She declined statin therapy. - Continue aspirin a couple of times a week.  Dyslipidemia Elevated LDL with good HDL. She declined statin therapy.  Irritable bowel syndrome Occasional flare-ups managed with dietary modifications. - Continue dietary management for IBS.        [1]  Current Outpatient Medications:    Ascorbic Acid (VITAMIN C) 500 MG CAPS, Take by mouth daily. , Disp: , Rfl:    aspirin EC 81 MG tablet, Take 81 mg by mouth daily. Swallow whole., Disp: , Rfl:    azelastine  (ASTELIN ) 0.1 % nasal spray, Place 2 sprays into both nostrils every morning. Use in each nostril as directed, Disp: 30 mL, Rfl: 3   Calcium Citrate-Vitamin D  200-250 MG-UNIT TABS, Take by mouth daily., Disp: , Rfl:    fluticasone  (FLONASE ) 50  MCG/ACT nasal spray, Place 2 sprays into both nostrils every evening., Disp: 16 g, Rfl: 3   levocetirizine (XYZAL ) 5 MG tablet, Take 1 tablet (5 mg total) by mouth every evening., Disp: 90 tablet, Rfl: 1   Multiple Vitamin (MULTIVITAMIN) capsule, Take 1 capsule by mouth daily., Disp: , Rfl:    alendronate  (FOSAMAX ) 70 MG tablet, TAKE 1 TABLET(70 MG) BY MOUTH EVERY 7 DAYS WITH A FULL GLASS OF WATER  AND ON AN EMPTY STOMACH, Disp: 12 tablet, Rfl: 3   lisinopril  (ZESTRIL ) 10 MG tablet, Take 1 tablet (10 mg total) by mouth daily., Disp: 90 tablet, Rfl: 3   Misc Natural Products (OSTEO BI-FLEX JOINT SHIELD) TABS, Take by mouth daily.  (Patient not taking: Reported on 05/23/2024), Disp: , Rfl:    montelukast  (SINGULAIR ) 10 MG tablet, Take 1 tablet (10 mg total) by mouth at bedtime., Disp: 90 tablet, Rfl: 3 [2]  Allergies Allergen Reactions   No Known Allergies

## 2024-05-24 ENCOUNTER — Ambulatory Visit: Payer: Self-pay | Admitting: Family Medicine

## 2024-05-24 LAB — CBC WITH DIFFERENTIAL/PLATELET
Absolute Lymphocytes: 1341 {cells}/uL (ref 850–3900)
Absolute Monocytes: 371 {cells}/uL (ref 200–950)
Basophils Absolute: 42 {cells}/uL (ref 0–200)
Basophils Relative: 0.8 %
Eosinophils Absolute: 80 {cells}/uL (ref 15–500)
Eosinophils Relative: 1.5 %
HCT: 42.8 % (ref 35.9–46.0)
Hemoglobin: 13.8 g/dL (ref 11.7–15.5)
MCH: 30.7 pg (ref 27.0–33.0)
MCHC: 32.2 g/dL (ref 31.6–35.4)
MCV: 95.1 fL (ref 81.4–101.7)
MPV: 9.6 fL (ref 7.5–12.5)
Monocytes Relative: 7 %
Neutro Abs: 3466 {cells}/uL (ref 1500–7800)
Neutrophils Relative %: 65.4 %
Platelets: 234 Thousand/uL (ref 140–400)
RBC: 4.5 Million/uL (ref 3.80–5.10)
RDW: 12.4 % (ref 11.0–15.0)
Total Lymphocyte: 25.3 %
WBC: 5.3 Thousand/uL (ref 3.8–10.8)

## 2024-05-24 LAB — LIPID PANEL
Cholesterol: 208 mg/dL — ABNORMAL HIGH (ref <200)
HDL: 51 mg/dL (ref >=50)
LDL Cholesterol (Calc): 135 mg/dL — ABNORMAL HIGH
Non-HDL Cholesterol (Calc): 157 mg/dL — ABNORMAL HIGH (ref <130)
Total CHOL/HDL Ratio: 4.1 (calc) (ref <5.0)
Triglycerides: 108 mg/dL (ref <150)

## 2024-05-24 LAB — COMPREHENSIVE METABOLIC PANEL WITH GFR
AG Ratio: 1.6 (calc) (ref 1.0–2.5)
ALT: 17 U/L (ref 6–29)
AST: 18 U/L (ref 10–35)
Albumin: 4.4 g/dL (ref 3.6–5.1)
Alkaline phosphatase (APISO): 51 U/L (ref 37–153)
BUN: 18 mg/dL (ref 7–25)
CO2: 29 mmol/L (ref 20–32)
Calcium: 9.3 mg/dL (ref 8.6–10.4)
Chloride: 103 mmol/L (ref 98–110)
Creat: 0.77 mg/dL (ref 0.60–1.00)
Globulin: 2.7 g/dL (ref 1.9–3.7)
Glucose, Bld: 90 mg/dL (ref 65–99)
Potassium: 4.3 mmol/L (ref 3.5–5.3)
Sodium: 139 mmol/L (ref 135–146)
Total Bilirubin: 0.7 mg/dL (ref 0.2–1.2)
Total Protein: 7.1 g/dL (ref 6.1–8.1)
eGFR: 83 mL/min/1.73m2 (ref >=60)

## 2024-06-04 ENCOUNTER — Other Ambulatory Visit: Payer: Self-pay | Admitting: Family Medicine

## 2024-06-04 MED ORDER — ROSUVASTATIN CALCIUM 10 MG PO TABS: 10.0000 mg | ORAL_TABLET | Freq: Every day | ORAL | 1 refills | Status: AC

## 2024-06-05 ENCOUNTER — Other Ambulatory Visit: Payer: Self-pay | Admitting: Family Medicine

## 2024-06-05 DIAGNOSIS — M810 Age-related osteoporosis without current pathological fracture: Secondary | ICD-10-CM

## 2024-06-06 NOTE — Telephone Encounter (Signed)
 Requested by interface [email or web address]. Receipt confirmed by pharmacy 05/23/24 at 8:26 am . Too soon for refills.  Requested Prescriptions  Refused Prescriptions Disp Refills   alendronate  (FOSAMAX ) 70 MG tablet [Pharmacy Med Name: ALENDRONATE  70MG  TABLETS] 12 tablet 3    Sig: TAKE 1 TABLET(70 MG) BY MOUTH EVERY 7 DAYS WITH A FULL GLASS OF WATER  AND ON AN EMPTY STOMACH     Endocrinology:  Bisphosphonates Failed - 06/06/2024  1:23 PM      Failed - Vitamin D  in normal range and within 360 days    Vit D, 25-Hydroxy  Date Value Ref Range Status  05/21/2022 48 30 - 100 ng/mL Final    Comment:    Vitamin D  Status         25-OH Vitamin D : . Deficiency:                    <20 ng/mL Insufficiency:             20 - 29 ng/mL Optimal:                 > or = 30 ng/mL . For 25-OH Vitamin D  testing on patients on  D2-supplementation and patients for whom quantitation  of D2 and D3 fractions is required, the [employer](TM) 25-OH VIT D, (D2,D3), LC/MS/MS is recommended: order  code 07111 (patients >75yrs). . See Note 1 . Note 1 . For additional information, please refer to  http://education.[facility].com/faq/FAQ199  (This link is being provided for informational/ educational purposes only.)          Failed - Mg Level in normal range and within 360 days    No results found for: MG       Failed - Phosphate in normal range and within 360 days    No results found for: PHOS       Passed - Ca in normal range and within 360 days    Calcium   Date Value Ref Range Status  05/23/2024 9.3 8.6 - 10.4 mg/dL Final         Passed - Cr in normal range and within 360 days    Creat  Date Value Ref Range Status  05/23/2024 0.77 0.60 - 1.00 mg/dL Final         Passed - eGFR is 30 or above and within 360 days    GFR, Est African American  Date Value Ref Range Status  05/12/2020 106 > OR = 60 mL/min/1.33m2 Final   GFR, Est Non African American  Date Value Ref Range Status  05/12/2020 91  > OR = 60 mL/min/1.62m2 Final   eGFR  Date Value Ref Range Status  05/23/2024 83 > OR = 60 mL/min/1.27m2 Final         Passed - Valid encounter within last 12 months    Recent Outpatient Visits           2 weeks ago Perennial allergic rhinitis   [place] [facility] [place] Mire, MD   4 months ago Perennial allergic rhinitis   [place] [facility] [place], Mire, MD              Passed - Bone Mineral Density or Dexa Scan completed in the last 2 years

## 2024-06-13 ENCOUNTER — Telehealth: Payer: Self-pay | Admitting: Family Medicine

## 2024-06-13 NOTE — Telephone Encounter (Signed)
 Copied from CRM [id number]. Topic: Appointments - Scheduling Inquiry for Clinic >> Jun 13, 2024  2:18 PM Alexandria Hill wrote: Pt would like to know if her appointment is virtual or in person. New appointment also says video call

## 2024-07-19 ENCOUNTER — Ambulatory Visit: Payer: Self-pay

## 2024-08-02 ENCOUNTER — Other Ambulatory Visit

## 2024-08-02 ENCOUNTER — Encounter

## 2024-08-23 ENCOUNTER — Ambulatory Visit

## 2025-05-24 ENCOUNTER — Ambulatory Visit: Admitting: Family Medicine
